# Patient Record
Sex: Female | Born: 1960 | Race: White | Hispanic: No | Smoking: Never smoker
Health system: Southern US, Community
[De-identification: ages and names within clinical notes are randomized; demographics above are authoritative.]

## PROBLEM LIST (undated history)

## (undated) DIAGNOSIS — R0600 Dyspnea, unspecified: Secondary | ICD-10-CM

## (undated) DIAGNOSIS — I82409 Acute embolism and thrombosis of unspecified deep veins of unspecified lower extremity: Secondary | ICD-10-CM

## (undated) DIAGNOSIS — F32A Depression, unspecified: Secondary | ICD-10-CM

## (undated) DIAGNOSIS — F329 Major depressive disorder, single episode, unspecified: Secondary | ICD-10-CM

## (undated) DIAGNOSIS — F419 Anxiety disorder, unspecified: Secondary | ICD-10-CM

## (undated) DIAGNOSIS — M1990 Unspecified osteoarthritis, unspecified site: Secondary | ICD-10-CM

## (undated) HISTORY — PX: COLONOSCOPY W/ POLYPECTOMY: SHX1380

## (undated) HISTORY — PX: TONSILLECTOMY: SUR1361

## (undated) HISTORY — PX: BREAST SURGERY: SHX581

## (undated) HISTORY — PX: OTHER SURGICAL HISTORY: SHX169

## (undated) HISTORY — PX: TUBAL LIGATION: SHX77

---

## 1989-03-07 HISTORY — PX: BACK SURGERY: SHX140

## 1997-07-07 ENCOUNTER — Other Ambulatory Visit: Admission: RE | Admit: 1997-07-07 | Discharge: 1997-07-07 | Payer: Self-pay | Admitting: Obstetrics & Gynecology

## 1998-04-06 ENCOUNTER — Ambulatory Visit (HOSPITAL_COMMUNITY): Admission: RE | Admit: 1998-04-06 | Discharge: 1998-04-06 | Payer: Self-pay | Admitting: Family Medicine

## 1998-04-06 ENCOUNTER — Encounter: Payer: Self-pay | Admitting: Family Medicine

## 2002-01-28 ENCOUNTER — Other Ambulatory Visit: Admission: RE | Admit: 2002-01-28 | Discharge: 2002-01-28 | Payer: Self-pay | Admitting: Obstetrics & Gynecology

## 2014-03-07 HISTORY — PX: BACK SURGERY: SHX140

## 2014-08-12 DIAGNOSIS — M5136 Other intervertebral disc degeneration, lumbar region: Secondary | ICD-10-CM | POA: Insufficient documentation

## 2014-08-12 DIAGNOSIS — M48061 Spinal stenosis, lumbar region without neurogenic claudication: Secondary | ICD-10-CM | POA: Insufficient documentation

## 2014-08-12 DIAGNOSIS — M4726 Other spondylosis with radiculopathy, lumbar region: Secondary | ICD-10-CM | POA: Insufficient documentation

## 2014-08-12 DIAGNOSIS — M5416 Radiculopathy, lumbar region: Secondary | ICD-10-CM | POA: Insufficient documentation

## 2014-08-12 DIAGNOSIS — M51369 Other intervertebral disc degeneration, lumbar region without mention of lumbar back pain or lower extremity pain: Secondary | ICD-10-CM | POA: Insufficient documentation

## 2014-10-28 DIAGNOSIS — M9905 Segmental and somatic dysfunction of pelvic region: Secondary | ICD-10-CM | POA: Insufficient documentation

## 2014-11-25 DIAGNOSIS — G8929 Other chronic pain: Secondary | ICD-10-CM | POA: Insufficient documentation

## 2014-11-25 DIAGNOSIS — M545 Low back pain: Secondary | ICD-10-CM

## 2015-12-03 DIAGNOSIS — M5412 Radiculopathy, cervical region: Secondary | ICD-10-CM | POA: Insufficient documentation

## 2015-12-03 DIAGNOSIS — M533 Sacrococcygeal disorders, not elsewhere classified: Secondary | ICD-10-CM | POA: Insufficient documentation

## 2016-10-14 HISTORY — PX: ESOPHAGOGASTRODUODENOSCOPY: SHX1529

## 2016-10-14 HISTORY — PX: COLONOSCOPY: SHX5424

## 2016-11-21 NOTE — Pre-Procedure Instructions (Signed)
JULIENNE VOGLER  11/21/2016    Your procedure is scheduled on Thursday, September 20     Report to [facility] Admitting at  5:30 AM                   Your surgery or procedure is scheduled for 7:30 AM    Call this number if you have problems the morning of surgery: [phone]                   Remember:  Do not eat food or drink liquids after midnight Wednesday, September 19  Take these medicines the morning of surgery with A SIP OF WATER: FLUoxetine (PROZAC).                Take if needed: oxyCODONE-acetaminophen (PERCOCET/ROXICET)                  STOP taking Aspirin, Aspirin Products (Goody Powder, Excedrin Migraine), Ibuprofen (Advil), Naproxen (Aleve), Vitiamins and Herbal Products (ie Fish Oil).  Special instructions:  [place]- Preparing For Surgery  Before surgery, you can play an important role. Because skin is not sterile, your skin needs to be as free of germs as possible. You can reduce the number of germs on your skin by washing with CHG (chlorahexidine gluconate) Soap before surgery.  CHG is an antiseptic cleaner which kills germs and bonds with the skin to continue killing germs even after washing.  Please do not use if you have an allergy to CHG or antibacterial soaps. If your skin becomes reddened/irritated stop using the CHG.  Do not shave (including legs and underarms) for at least 48 hours prior to first CHG shower. It is OK to shave your face.  Please follow these instructions carefully.   1. Shower the NIGHT BEFORE SURGERY and the MORNING OF SURGERY with CHG.   2. If you chose to wash your hair, wash your hair first as usual with your normal shampoo.  3. After you shampoo, rinse your hair and body thoroughly to remove the shampoo.  Wash your face and private area with your regular home soap and rinse.  4. Use CHG as you would any other liquid soap. You can apply CHG directly to the skin and wash gently with a scrungie or a clean washcloth.    5. Apply the CHG Soap to your body ONLY FROM THE NECK DOWN.  Do not use on open wounds or open sores. Avoid contact with your eyes, ears, mouth and genitals (private parts). Wash genitals (private parts) with your normal soap.  6. Wash thoroughly, paying special attention to the area where your surgery will be performed.  7. Thoroughly rinse your body with warm water from the neck down.  8. DO NOT shower/wash with your normal soap after using and rinsing off the CHG Soap.  9. Pat yourself dry with a CLEAN TOWEL.   10. Wear CLEAN PAJAMAS   11. Place CLEAN SHEETS on your bed the night of your first shower and DO NOT SLEEP WITH PETS.  Day of Surgery: Shower as above Do not apply any deodorants/lotions, powders or colognes. Please wear clean clothes to the hospital/surgery center.    Do not wear jewelry, make-up or nail polish.  Do not shave 48 hours prior to surgery.  Men may shave face and neck.  Do not bring valuables to the hospital.  [facility] is not responsible for any belongings or valuables.  Contacts, dentures  or bridgework may not be worn into surgery.  Leave your suitcase in the car.  After surgery it may be brought to your room.  For patients admitted to the hospital, discharge time will be determined by your treatment team.  Patients discharged the day of surgery will not be allowed to drive home.   Please read over the following fact sheets that you were given: Pain Booklet, Coughing and Deep Breathing, Surgical Site Infections.

## 2016-11-22 ENCOUNTER — Ambulatory Visit: Payer: Self-pay | Admitting: Physician Assistant

## 2016-11-22 ENCOUNTER — Encounter (HOSPITAL_COMMUNITY): Payer: Self-pay

## 2016-11-22 ENCOUNTER — Encounter (HOSPITAL_COMMUNITY)
Admission: RE | Admit: 2016-11-22 | Discharge: 2016-11-22 | Disposition: A | Discharge status: Home / Self Care | Payer: Medicare Other | Source: Ambulatory Visit | Attending: Orthopedic Surgery | Admitting: Orthopedic Surgery

## 2016-11-22 DIAGNOSIS — M5441 Lumbago with sciatica, right side: Secondary | ICD-10-CM | POA: Diagnosis not present

## 2016-11-22 DIAGNOSIS — Z885 Allergy status to narcotic agent status: Secondary | ICD-10-CM | POA: Diagnosis not present

## 2016-11-22 DIAGNOSIS — G894 Chronic pain syndrome: Secondary | ICD-10-CM | POA: Diagnosis not present

## 2016-11-22 DIAGNOSIS — M069 Rheumatoid arthritis, unspecified: Secondary | ICD-10-CM | POA: Diagnosis not present

## 2016-11-22 DIAGNOSIS — M1990 Unspecified osteoarthritis, unspecified site: Secondary | ICD-10-CM | POA: Diagnosis not present

## 2016-11-22 DIAGNOSIS — F419 Anxiety disorder, unspecified: Secondary | ICD-10-CM | POA: Diagnosis not present

## 2016-11-22 DIAGNOSIS — M810 Age-related osteoporosis without current pathological fracture: Secondary | ICD-10-CM | POA: Diagnosis not present

## 2016-11-22 DIAGNOSIS — M961 Postlaminectomy syndrome, not elsewhere classified: Secondary | ICD-10-CM | POA: Diagnosis present

## 2016-11-22 DIAGNOSIS — Z886 Allergy status to analgesic agent status: Secondary | ICD-10-CM | POA: Diagnosis not present

## 2016-11-22 DIAGNOSIS — Z79899 Other long term (current) drug therapy: Secondary | ICD-10-CM | POA: Diagnosis not present

## 2016-11-22 DIAGNOSIS — G8929 Other chronic pain: Secondary | ICD-10-CM | POA: Diagnosis present

## 2016-11-22 DIAGNOSIS — F329 Major depressive disorder, single episode, unspecified: Secondary | ICD-10-CM | POA: Diagnosis not present

## 2016-11-22 HISTORY — DX: Dyspnea, unspecified: R06.00

## 2016-11-22 HISTORY — DX: Unspecified osteoarthritis, unspecified site: M19.90

## 2016-11-22 HISTORY — DX: Major depressive disorder, single episode, unspecified: F32.9

## 2016-11-22 HISTORY — DX: Depression, unspecified: F32.A

## 2016-11-22 LAB — BASIC METABOLIC PANEL
Anion gap: 6 (ref 5–15)
BUN: 14 mg/dL (ref 6–20)
CALCIUM: 9.3 mg/dL (ref 8.9–10.3)
CO2: 24 mmol/L (ref 22–32)
CREATININE: 0.97 mg/dL (ref 0.44–1.00)
Chloride: 108 mmol/L (ref 101–111)
GFR calc Af Amer: >60 mL/min (ref >60)
GFR calc non Af Amer: >60 mL/min (ref >60)
Glucose, Bld: 97 mg/dL (ref 65–99)
Potassium: 4.1 mmol/L (ref 3.5–5.1)
SODIUM: 138 mmol/L (ref 135–145)

## 2016-11-22 LAB — CBC
HCT: 36.9 % (ref 36.0–46.0)
Hemoglobin: 11.7 g/dL — ABNORMAL LOW (ref 12.0–15.0)
MCH: 27.3 pg (ref 26.0–34.0)
MCHC: 31.7 g/dL (ref 30.0–36.0)
MCV: 86 fL (ref 78.0–100.0)
PLATELETS: 326 10*3/uL (ref 150–400)
RBC: 4.29 MIL/uL (ref 3.87–5.11)
RDW: 14 % (ref 11.5–15.5)
WBC: 5.5 10*3/uL (ref 4.0–10.5)

## 2016-11-22 LAB — SURGICAL PCR SCREEN
MRSA, PCR: NEGATIVE
STAPHYLOCOCCUS AUREUS: NEGATIVE

## 2016-11-23 NOTE — Progress Notes (Signed)
Anesthesia Chart Review:  Pt is a 56 year old female scheduled for lumbar spinal cord stimulator insertion on 11/24/2016 with Venita Lick, MD  PMH reviewed. Never smoker. BMI 33  Medications reviewed.  Preoperative labs reviewed.    If no changes, I anticipate pt can proceed with surgery as scheduled.   Rica Mast, FNP-BC [facility] Short Stay Surgical Center/Anesthesiology Phone: [phone] 11/23/2016 9:06 AM

## 2016-11-24 ENCOUNTER — Encounter (HOSPITAL_COMMUNITY)
Admission: RE | Disposition: A | Discharge status: Home / Self Care | Payer: Self-pay | Source: Ambulatory Visit | Attending: Orthopedic Surgery

## 2016-11-24 ENCOUNTER — Ambulatory Visit (HOSPITAL_COMMUNITY): Payer: Medicare Other | Admitting: Anesthesiology

## 2016-11-24 ENCOUNTER — Ambulatory Visit (HOSPITAL_COMMUNITY): Payer: Medicare Other

## 2016-11-24 ENCOUNTER — Ambulatory Visit (HOSPITAL_COMMUNITY): Payer: Medicare Other | Admitting: Emergency Medicine

## 2016-11-24 ENCOUNTER — Observation Stay (HOSPITAL_COMMUNITY)
Admission: RE | Admit: 2016-11-24 | Discharge: 2016-11-25 | Disposition: A | Discharge status: Home / Self Care | Payer: Medicare Other | Source: Ambulatory Visit | Attending: Orthopedic Surgery | Admitting: Orthopedic Surgery

## 2016-11-24 DIAGNOSIS — G8929 Other chronic pain: Secondary | ICD-10-CM | POA: Diagnosis present

## 2016-11-24 DIAGNOSIS — F419 Anxiety disorder, unspecified: Secondary | ICD-10-CM | POA: Diagnosis not present

## 2016-11-24 DIAGNOSIS — G894 Chronic pain syndrome: Secondary | ICD-10-CM | POA: Diagnosis not present

## 2016-11-24 DIAGNOSIS — M961 Postlaminectomy syndrome, not elsewhere classified: Secondary | ICD-10-CM | POA: Diagnosis not present

## 2016-11-24 DIAGNOSIS — Z886 Allergy status to analgesic agent status: Secondary | ICD-10-CM | POA: Insufficient documentation

## 2016-11-24 DIAGNOSIS — M810 Age-related osteoporosis without current pathological fracture: Secondary | ICD-10-CM | POA: Insufficient documentation

## 2016-11-24 DIAGNOSIS — M069 Rheumatoid arthritis, unspecified: Secondary | ICD-10-CM | POA: Insufficient documentation

## 2016-11-24 DIAGNOSIS — F329 Major depressive disorder, single episode, unspecified: Secondary | ICD-10-CM | POA: Insufficient documentation

## 2016-11-24 DIAGNOSIS — M5441 Lumbago with sciatica, right side: Secondary | ICD-10-CM | POA: Insufficient documentation

## 2016-11-24 DIAGNOSIS — Z419 Encounter for procedure for purposes other than remedying health state, unspecified: Secondary | ICD-10-CM

## 2016-11-24 DIAGNOSIS — Z79899 Other long term (current) drug therapy: Secondary | ICD-10-CM | POA: Insufficient documentation

## 2016-11-24 DIAGNOSIS — Z885 Allergy status to narcotic agent status: Secondary | ICD-10-CM | POA: Insufficient documentation

## 2016-11-24 DIAGNOSIS — M1990 Unspecified osteoarthritis, unspecified site: Secondary | ICD-10-CM | POA: Insufficient documentation

## 2016-11-24 HISTORY — PX: SPINAL CORD STIMULATOR INSERTION: SHX5378

## 2016-11-24 SURGERY — INSERTION, SPINAL CORD STIMULATOR, LUMBAR
Anesthesia: General | Site: Spine Thoracic

## 2016-11-24 MED ORDER — METHOCARBAMOL 500 MG PO TABS: 500.0000 mg | ORAL_TABLET | Freq: Three times a day (TID) | ORAL | 0 refills | Status: DC | PRN

## 2016-11-24 MED ORDER — DEXAMETHASONE SODIUM PHOSPHATE 4 MG/ML IJ SOLN: 4.0000 mg | Freq: Four times a day (QID) | INTRAMUSCULAR | Status: AC

## 2016-11-24 MED ORDER — PHENYLEPHRINE HCL 10 MG/ML IJ SOLN
INTRAMUSCULAR | Status: DC | PRN
  Administered 2016-11-24: 160 ug via INTRAVENOUS
  Administered 2016-11-24 (×2): 120 ug via INTRAVENOUS

## 2016-11-24 MED ORDER — ONDANSETRON HCL 4 MG PO TABS: 4.0000 mg | ORAL_TABLET | Freq: Four times a day (QID) | ORAL | Status: DC | PRN

## 2016-11-24 MED ORDER — PHENOL 1.4 % MT LIQD: 1.0000 | OROMUCOSAL | Status: DC | PRN

## 2016-11-24 MED ORDER — HEMOSTATIC AGENTS (NO CHARGE) OPTIME
TOPICAL | Status: DC | PRN
  Administered 2016-11-24: 1 via TOPICAL

## 2016-11-24 MED ORDER — BUPIVACAINE-EPINEPHRINE 0.25% -1:200000 IJ SOLN
INTRAMUSCULAR | Status: DC | PRN
  Administered 2016-11-24: 20 mL

## 2016-11-24 MED ORDER — ARTIFICIAL TEARS OPHTHALMIC OINT
TOPICAL_OINTMENT | OPHTHALMIC | Status: DC | PRN
  Administered 2016-11-24: 1 via OPHTHALMIC

## 2016-11-24 MED ORDER — METHOCARBAMOL 500 MG PO TABS
500.0000 mg | ORAL_TABLET | Freq: Four times a day (QID) | ORAL | Status: DC | PRN
  Administered 2016-11-24 – 2016-11-25 (×3): 500 mg via ORAL
  Filled 2016-11-24 (×3): qty 1

## 2016-11-24 MED ORDER — OXYCODONE HCL 5 MG PO TABS
10.0000 mg | ORAL_TABLET | ORAL | Status: DC | PRN
  Administered 2016-11-24 – 2016-11-25 (×6): 10 mg via ORAL
  Filled 2016-11-24 (×6): qty 2

## 2016-11-24 MED ORDER — GLYCOPYRROLATE 0.2 MG/ML IJ SOLN: INTRAMUSCULAR | Status: DC | PRN

## 2016-11-24 MED ORDER — FLUOXETINE HCL 20 MG PO CAPS
40.0000 mg | ORAL_CAPSULE | Freq: Two times a day (BID) | ORAL | Status: DC
Start: 2016-11-24 — End: 2016-11-25
  Filled 2016-11-24 (×2): qty 2

## 2016-11-24 MED ORDER — MENTHOL 3 MG MT LOZG: 1.0000 | LOZENGE | OROMUCOSAL | Status: DC | PRN

## 2016-11-24 MED ORDER — LACTATED RINGERS IV SOLN
INTRAVENOUS | Status: DC | PRN
  Administered 2016-11-24 (×3): via INTRAVENOUS

## 2016-11-24 MED ORDER — HYDROMORPHONE HCL 1 MG/ML IJ SOLN
INTRAMUSCULAR | Status: AC
  Filled 2016-11-24: qty 1

## 2016-11-24 MED ORDER — LACTATED RINGERS IV SOLN: INTRAVENOUS | Status: DC

## 2016-11-24 MED ORDER — THROMBIN 20000 UNITS EX SOLR
CUTANEOUS | Status: AC
  Filled 2016-11-24: qty 20000

## 2016-11-24 MED ORDER — METHOCARBAMOL 1000 MG/10ML IJ SOLN
500.0000 mg | Freq: Four times a day (QID) | INTRAVENOUS | Status: DC | PRN
  Filled 2016-11-24: qty 5

## 2016-11-24 MED ORDER — PROPOFOL 10 MG/ML IV BOLUS
INTRAVENOUS | Status: DC | PRN
  Administered 2016-11-24 (×2): 100 mg via INTRAVENOUS

## 2016-11-24 MED ORDER — PHENYLEPHRINE HCL 10 MG/ML IJ SOLN
INTRAVENOUS | Status: DC | PRN
  Administered 2016-11-24: 50 ug/min via INTRAVENOUS

## 2016-11-24 MED ORDER — ROCURONIUM BROMIDE 100 MG/10ML IV SOLN
INTRAVENOUS | Status: DC | PRN
  Administered 2016-11-24: 50 mg via INTRAVENOUS

## 2016-11-24 MED ORDER — ONDANSETRON HCL 4 MG/2ML IJ SOLN: 4.0000 mg | Freq: Four times a day (QID) | INTRAMUSCULAR | Status: DC | PRN

## 2016-11-24 MED ORDER — QUETIAPINE FUMARATE 100 MG PO TABS
100.0000 mg | ORAL_TABLET | Freq: Every day | ORAL | Status: DC
  Filled 2016-11-24: qty 1

## 2016-11-24 MED ORDER — CEFAZOLIN SODIUM-DEXTROSE 2-4 GM/100ML-% IV SOLN
2.0000 g | INTRAVENOUS | Status: AC
  Administered 2016-11-24: 2 g via INTRAVENOUS

## 2016-11-24 MED ORDER — BUPIVACAINE-EPINEPHRINE (PF) 0.25% -1:200000 IJ SOLN
INTRAMUSCULAR | Status: AC
  Filled 2016-11-24: qty 30

## 2016-11-24 MED ORDER — OXYCODONE-ACETAMINOPHEN 10-325 MG PO TABS: 1.0000 | ORAL_TABLET | ORAL | 0 refills | Status: DC | PRN

## 2016-11-24 MED ORDER — SODIUM CHLORIDE 0.9 % IV SOLN: 250.0000 mL | INTRAVENOUS | Status: DC

## 2016-11-24 MED ORDER — EPHEDRINE SULFATE 50 MG/ML IJ SOLN
INTRAMUSCULAR | Status: DC | PRN
  Administered 2016-11-24: 10 mg via INTRAVENOUS

## 2016-11-24 MED ORDER — GLYCOPYRROLATE 0.2 MG/ML IJ SOLN
INTRAMUSCULAR | Status: DC | PRN
  Administered 2016-11-24: 0.2 mg via INTRAVENOUS

## 2016-11-24 MED ORDER — SODIUM CHLORIDE 0.9% FLUSH: 3.0000 mL | Freq: Two times a day (BID) | INTRAVENOUS | Status: DC

## 2016-11-24 MED ORDER — THROMBIN 20000 UNITS EX SOLR
CUTANEOUS | Status: DC | PRN
  Administered 2016-11-24: 20000 [IU] via TOPICAL

## 2016-11-24 MED ORDER — HYDROMORPHONE HCL 1 MG/ML IJ SOLN
0.2500 mg | INTRAMUSCULAR | Status: DC | PRN
  Administered 2016-11-24 (×3): 0.5 mg via INTRAVENOUS

## 2016-11-24 MED ORDER — ACETAMINOPHEN 10 MG/ML IV SOLN
INTRAVENOUS | Status: DC | PRN
Start: 2016-11-24 — End: 2016-11-24
  Administered 2016-11-24: 1000 mg via INTRAVENOUS

## 2016-11-24 MED ORDER — DEXAMETHASONE 4 MG PO TABS
4.0000 mg | ORAL_TABLET | Freq: Four times a day (QID) | ORAL | Status: AC
  Administered 2016-11-24 (×3): 4 mg via ORAL
  Filled 2016-11-24 (×3): qty 1

## 2016-11-24 MED ORDER — LIDOCAINE HCL (CARDIAC) 20 MG/ML IV SOLN
INTRAVENOUS | Status: DC | PRN
  Administered 2016-11-24: 80 mg via INTRATRACHEAL

## 2016-11-24 MED ORDER — DEXAMETHASONE SODIUM PHOSPHATE 10 MG/ML IJ SOLN
INTRAMUSCULAR | Status: DC | PRN
  Administered 2016-11-24: 10 mg via INTRAVENOUS

## 2016-11-24 MED ORDER — PROMETHAZINE HCL 25 MG/ML IJ SOLN
INTRAMUSCULAR | Status: AC
  Filled 2016-11-24: qty 1

## 2016-11-24 MED ORDER — SODIUM CHLORIDE 0.9% FLUSH: 3.0000 mL | INTRAVENOUS | Status: DC | PRN

## 2016-11-24 MED ORDER — ONDANSETRON HCL 4 MG/2ML IJ SOLN
INTRAMUSCULAR | Status: DC | PRN
  Administered 2016-11-24: 4 mg via INTRAVENOUS

## 2016-11-24 MED ORDER — FENTANYL CITRATE (PF) 250 MCG/5ML IJ SOLN
INTRAMUSCULAR | Status: DC | PRN
  Administered 2016-11-24: 50 ug via INTRAVENOUS
  Administered 2016-11-24: 75 ug via INTRAVENOUS
  Administered 2016-11-24: 50 ug via INTRAVENOUS
  Administered 2016-11-24: 100 ug via INTRAVENOUS
  Administered 2016-11-24: 25 ug via INTRAVENOUS
  Administered 2016-11-24: 50 ug via INTRAVENOUS

## 2016-11-24 MED ORDER — MIDAZOLAM HCL 2 MG/2ML IJ SOLN
INTRAMUSCULAR | Status: DC | PRN
  Administered 2016-11-24 (×2): 1 mg via INTRAVENOUS

## 2016-11-24 MED ORDER — CEFAZOLIN SODIUM-DEXTROSE 1-4 GM/50ML-% IV SOLN
1.0000 g | Freq: Three times a day (TID) | INTRAVENOUS | Status: AC
  Administered 2016-11-24 (×2): 1 g via INTRAVENOUS
  Filled 2016-11-24 (×2): qty 50

## 2016-11-24 MED ORDER — 0.9 % SODIUM CHLORIDE (POUR BTL) OPTIME
TOPICAL | Status: DC | PRN
  Administered 2016-11-24 (×2): 1000 mL

## 2016-11-24 MED ORDER — PROMETHAZINE HCL 25 MG/ML IJ SOLN
6.2500 mg | INTRAMUSCULAR | Status: DC | PRN
  Administered 2016-11-24: 6.25 mg via INTRAVENOUS

## 2016-11-24 MED ORDER — SUGAMMADEX SODIUM 200 MG/2ML IV SOLN
INTRAVENOUS | Status: DC | PRN
  Administered 2016-11-24: 200 mg via INTRAVENOUS

## 2016-11-24 SURGICAL SUPPLY — 57 items
CANISTER SUCT 3000ML PPV (MISCELLANEOUS) ×3 IMPLANT
CLOSURE STERI-STRIP 1/2X4 (GAUZE/BANDAGES/DRESSINGS) ×2
CLSR STERI-STRIP ANTIMIC 1/2X4 (GAUZE/BANDAGES/DRESSINGS) ×3 IMPLANT
COVER PROBE W GEL 5X96 (DRAPES) IMPLANT
COVER SURGICAL LIGHT HANDLE (MISCELLANEOUS) ×3 IMPLANT
DRAPE C-ARM 42X72 X-RAY (DRAPES) ×3 IMPLANT
DRAPE INCISE IOBAN 85X60 (DRAPES) ×3 IMPLANT
DRAPE SURG 17X23 STRL (DRAPES) ×3 IMPLANT
DRAPE U-SHAPE 47X51 STRL (DRAPES) ×3 IMPLANT
DRSG AQUACEL AG ADV 3.5X 6 (GAUZE/BANDAGES/DRESSINGS) ×6 IMPLANT
DRSG OPSITE POSTOP 4X6 (GAUZE/BANDAGES/DRESSINGS) ×4 IMPLANT
DURAPREP 26ML APPLICATOR (WOUND CARE) ×3 IMPLANT
ELECT BLADE 4.0 EZ CLEAN MEGAD (MISCELLANEOUS) ×3
ELECT CAUTERY BLADE 6.4 (BLADE) ×3 IMPLANT
ELECT PENCIL ROCKER SW 15FT (MISCELLANEOUS) ×3 IMPLANT
ELECT REM PT RETURN 9FT ADLT (ELECTROSURGICAL) ×3
ELECTRODE BLDE 4.0 EZ CLN MEGD (MISCELLANEOUS) IMPLANT
ELECTRODE REM PT RTRN 9FT ADLT (ELECTROSURGICAL) ×1 IMPLANT
GLOVE BIO SURGEON STRL SZ7 (GLOVE) ×3 IMPLANT
GLOVE BIOGEL PI IND STRL 7.0 (GLOVE) ×1 IMPLANT
GLOVE BIOGEL PI IND STRL 8.5 (GLOVE) ×1 IMPLANT
GLOVE BIOGEL PI INDICATOR 7.0 (GLOVE) ×2
GLOVE BIOGEL PI INDICATOR 8.5 (GLOVE) ×2
GLOVE SS N UNI LF 8.5 STRL (GLOVE) ×6 IMPLANT
GOWN STRL REUS W/ TWL LRG LVL3 (GOWN DISPOSABLE) ×2 IMPLANT
GOWN STRL REUS W/TWL 2XL LVL3 (GOWN DISPOSABLE) ×3 IMPLANT
GOWN STRL REUS W/TWL LRG LVL3 (GOWN DISPOSABLE) ×6
KIT BASIN OR (CUSTOM PROCEDURE TRAY) ×3 IMPLANT
KIT ROOM TURNOVER OR (KITS) ×3 IMPLANT
LEAD LAMITRODE PENTA 3MM 60CM (Lead) ×2 IMPLANT
NDL SPNL 18GX3.5 QUINCKE PK (NEEDLE) ×1 IMPLANT
NEEDLE 22X1 1/2 (OR ONLY) (NEEDLE) ×5 IMPLANT
NEEDLE SPNL 18GX3.5 QUINCKE PK (NEEDLE) ×3 IMPLANT
NS IRRIG 1000ML POUR BTL (IV SOLUTION) ×5 IMPLANT
PACK LAMINECTOMY ORTHO (CUSTOM PROCEDURE TRAY) ×3 IMPLANT
PACK UNIVERSAL I (CUSTOM PROCEDURE TRAY) ×3 IMPLANT
PAD ARMBOARD 7.5X6 YLW CONV (MISCELLANEOUS) ×6 IMPLANT
PROCLAIM ELITE 7IPG W/PAT CONT (Neuro Prosthesis/Implant) ×2 IMPLANT
SPATULA SILICONE BRAIN 10MM (MISCELLANEOUS) IMPLANT
SPONGE LAP 4X18 X RAY DECT (DISPOSABLE) IMPLANT
SPONGE SURGIFOAM ABS GEL 100 (HEMOSTASIS) ×1 IMPLANT
STAPLER VISISTAT 35W (STAPLE) ×3 IMPLANT
SURGIFLO W/THROMBIN 8M KIT (HEMOSTASIS) ×5 IMPLANT
SUT BONE WAX W31G (SUTURE) ×3 IMPLANT
SUT ETHIBOND 2 OS 4 DA (SUTURE) ×3 IMPLANT
SUT MNCRL AB 3-0 PS2 18 (SUTURE) ×6 IMPLANT
SUT MON AB 3-0 SH 27 (SUTURE) ×3
SUT MON AB 3-0 SH27 (SUTURE) IMPLANT
SUT VIC AB 1 CT1 18XCR BRD 8 (SUTURE) ×2 IMPLANT
SUT VIC AB 1 CT1 8-18 (SUTURE) ×6
SUT VIC AB 2-0 CT1 18 (SUTURE) ×3 IMPLANT
SYR BULB IRRIGATION 50ML (SYRINGE) ×3 IMPLANT
SYR CONTROL 10ML LL (SYRINGE) ×3 IMPLANT
TOWEL OR 17X24 6PK STRL BLUE (TOWEL DISPOSABLE) ×5 IMPLANT
TOWEL OR 17X26 10 PK STRL BLUE (TOWEL DISPOSABLE) ×3 IMPLANT
TRAY CATH 16FR W/PLASTIC CATH (SET/KITS/TRAYS/PACK) ×2 IMPLANT
WATER STERILE IRR 1000ML POUR (IV SOLUTION) ×1 IMPLANT

## 2016-11-24 NOTE — Anesthesia Postprocedure Evaluation (Signed)
Anesthesia Post Note  Patient: Brittney Carter  Procedure(s) Performed: Procedure(s) (LRB): LUMBAR SPINAL CORD STIMULATOR INSERTION (N/A)     Patient location during evaluation: PACU Anesthesia Type: General Level of consciousness: awake and sedated Pain management: pain level controlled Vital Signs Assessment: post-procedure vital signs reviewed and stable Respiratory status: spontaneous breathing, nonlabored ventilation, respiratory function stable and patient connected to nasal cannula oxygen Cardiovascular status: blood pressure returned to baseline and stable Postop Assessment: no apparent nausea or vomiting Anesthetic complications: no    Last Vitals:  Vitals:   11/24/16 1155 11/24/16 1200  BP: 116/82   Pulse: 68 61  Resp: 17 11  Temp:  (!) 36.3 C  SpO2: 93% 98%    Last Pain:  Vitals:   11/24/16 1153  PainSc: Asleep                 [name],JAMES TERRILL

## 2016-11-24 NOTE — Discharge Instructions (Signed)
Please contact Pain Management provider to discuss/obtain additional pain medications as well as appropriate pain regimen.

## 2016-11-24 NOTE — Anesthesia Preprocedure Evaluation (Addendum)
Anesthesia Evaluation  Patient identified by MRN, date of birth, ID band Patient awake    Reviewed: Allergy & Precautions, NPO status , Patient's Chart, lab work & pertinent test results  Airway Mallampati: II  TM Distance: >3 FB Neck ROM: Full    Dental no notable dental hx.    Pulmonary    breath sounds clear to auscultation       Cardiovascular negative cardio ROS   Rhythm:Regular Rate:Normal     Neuro/Psych    GI/Hepatic negative GI ROS, Neg liver ROS,   Endo/Other  negative endocrine ROS  Renal/GU negative Renal ROS     Musculoskeletal Chronic back pain syndrome   Abdominal   Peds  Hematology   Anesthesia Other Findings   Reproductive/Obstetrics                            Anesthesia Physical Anesthesia Plan  ASA: II  Anesthesia Plan: General   Post-op Pain Management:    Induction:   PONV Risk Score and Plan: 4 or greater and Ondansetron, Dexamethasone, Midazolam, Scopolamine patch - Pre-op, Propofol infusion and Treatment may vary due to age or medical condition  Airway Management Planned: Oral ETT  Additional Equipment:   Intra-op Plan:   Post-operative Plan: Extubation in OR  Informed Consent: I have reviewed the patients History and Physical, chart, labs and discussed the procedure including the risks, benefits and alternatives for the proposed anesthesia with the patient or authorized representative who has indicated his/her understanding and acceptance.     Plan Discussed with: CRNA  Anesthesia Plan Comments:         Anesthesia Quick Evaluation

## 2016-11-24 NOTE — Op Note (Signed)
Operative note.  Preoperative diagnosis. Chronic pain syndrome. Failed back syndrome.  Postoperative diagnosis. Same.  Operative procedure. Implantation of spinal cord stimulator. Implant system used was the [facility]. Jude system. The Penta lead placed via T9 laminotomy. With the proclaimed 7 battery placed in the left posterior lateral quadrant lumbar spine.  Complications. None.  Indications. This is a very pleasant 56 year old woman has had multiple spinal procedures and unfortunately has had chronic pain. Unfortunately her quality of life is suffered and her pain is require long-term narcotic usage. She ultimately had a spinal cord stimulator trial and the success. There is reduce pain , and improved quality of life. As a result of the positive trial result we elected to proceed with implantation. All appropriate risks benefits and alternatives were discussed with the patient. Consent was obtained after informed consent.  Operative note. Patient was brought the operating room placed on the operating room table. After successful induction general anesthesia and endotracheal intubation teds SCDs were applied and she was turned prone onto the Wilson frame. The arms were tucked at the side and the thoracolumbar spine was prepped and draped in a standard fashion. All bony prominences were well-padded. A timeout was then taken this point to confirm patient procedure and all other important data.  X-ray was used to identify the T9 10 disc space and I marked out my incision. I infiltrated this incision with quarter percent Marcaine and then made a midline incision. Sharp dissection was carried out down to the deep fascia I exposed the posterior aspect of the T9-T10 and T11 spinous process I then stripped the paraspinal muscles using Cobb and Bovie to expose the inferior portion of the T9 lamina all of T10 and the majority T11. Hemostasis was obtained using bipolar cautery. Self-retaining retractors were placed  into the wound and then I used x-ray again to confirm the T9 pedicle as well as the T9-10 disc space level. Once I confirm the appropriate level I removed the inferior portion of T9 spinous process using a double-action Leksell rongeur. I then used a 2 and 3 mm Kerrison punch to perform a laminotomy of T9. Penfield 4 was used to dissect through the central raphae ligamentum flavum and a 2 mm Kerrison punch was used to excise the ligamentum flavum and expose the dorsal surface of the thecal sac. At this point I used my trial implant and easily passed to the base of the T7-8 disc space level. With this is a implantation I obtained the implant and then gently passed the Penta lead via my T9 laminotomy of to the T7-8 disc space. I confirmed with the [place] rep that the implant was properly positioned and covering the area for stimulation for best pain relief. Once this was confirmed by the rep I then secured the leads directly to the spinous process of T T10 with a #2 Ethibond. I then made a second incision over the left gluteal region and dissected down approximately 3-1/2 cm I created a pocket in the submuscular passer advanced the leads from the thoracic wound to the gluteal wound. The leads were then connected to the battery and then secured the battery to the deep fascia with 2 #1 Vicryl sutures.  At this point all wounds were copiously irrigated with normal saline hemostasis was obtained using bipolar electrocautery and FloSeal. Both wounds were closed in layered fashion with interrupted #1 Vicryl suture, 2-0 Vicryl suture, and 3-0 Monocryl for the skin. Steri-Strips dry dressings were applied and the patient  was extubated. Following extubation catheter was done because of the fluids that she received intraoperatively. Patient was ultimately transferred to the PACU that incident. The end of the case all needle sponge counts were correct. There are no adverse intraoperative

## 2016-11-24 NOTE — OR Nursing (Signed)
5009: in&out cath=450cc cyu per protocol, no trauma

## 2016-11-24 NOTE — Brief Op Note (Signed)
11/24/2016  10:00 AM  PATIENT:  [name]  56 y.o. female  PRE-OPERATIVE DIAGNOSIS:  Chronic pain, failed back syndrome  POST-OPERATIVE DIAGNOSIS:  Chronic pain, failed back syndrome  PROCEDURE:  Procedure(s) with comments: LUMBAR SPINAL CORD STIMULATOR INSERTION (N/A) - 2.5 hrs  SURGEON:  Surgeon(s) and Role:    Melina Schools, MD - Primary  PHYSICIAN ASSISTANT:   ASSISTANTS: none   ANESTHESIA:   general  EBL:  Total I/O In: 1000 [I.V.:1000] Out: 475 [Urine:450; Blood:25]  BLOOD ADMINISTERED:none  DRAINS: none   LOCAL MEDICATIONS USED:  MARCAINE     SPECIMEN:  No Specimen  DISPOSITION OF SPECIMEN:  N/A  COUNTS:  YES  TOURNIQUET:  * No tourniquets in log *  DICTATION: .Dragon Dictation  PLAN OF CARE: Admit for overnight observation  PATIENT DISPOSITION:  PACU - hemodynamically stable.

## 2016-11-24 NOTE — Transfer of Care (Signed)
Immediate Anesthesia Transfer of Carter Note  Patient: Brittney Carter  Procedure(s) Performed: Procedure(s) with comments: LUMBAR SPINAL CORD STIMULATOR INSERTION (N/A) - 2.5 hrs  Patient Location: PACU  Anesthesia Type:General  Level of Consciousness: awake, alert  and patient cooperative  Airway & Oxygen Therapy: Patient Spontanous Breathing and Patient connected to nasal cannula oxygen  Post-op Assessment: Report given to RN, Post -op Vital signs reviewed and stable, Patient moving all extremities X 4 and Patient able to stick tongue midline  Post vital signs: Reviewed and stable  Last Vitals:  Vitals:   11/24/16 0601  BP: 122/81  Pulse: 65  Resp: 20  SpO2: 96%    Last Pain:  Vitals:   11/24/16 0622  PainSc: 10-Worst pain ever      Patients Stated Pain Goal: 3 (11/24/16 0622)  Complications: No apparent anesthesia complications

## 2016-11-24 NOTE — H&P (Signed)
History of Present Illness  The patient is a 56 year old female who presents for a Follow-up for H & P. The patient is scheduled for a SCS placement to be performed by Dr. Duane Lope D. [name], MD at [facility] on 11-24-16 . Pt reports hx of good health. Pt has hx of a back surgeries in 1991 and 2016.  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 TraMADol HCl *ANALGESICS - OPIOID*  Difficulty breathing, Rash, Swelling. Allergies Reconciled   Social History  Children  2 Current drinker  06/28/2016: Currently drinks only occasionally per week Current work status  disabled Exercise  Exercises rarely; does other Living situation  live alone Marital status  divorced No history of drug/alcohol rehab  Not under pain contract  Tobacco / smoke exposure  06/28/2016: no Tobacco use  Never smoker. 06/28/2016  Medication History  Percocet (5-325MG Tablet, 1 (one) Oral three times daily, as needed, Taken starting 11/16/2016) Active. FLUoxetine HCl (40MG Capsule, Oral) Active. Belsomra (20MG Tablet, Oral) Active. QUEtiapine Fumarate (100MG Tablet, Oral) Active. Medications Reconciled  Past Surgical History  Mammoplasty; Reduction  bilateral Other Orthopaedic Surgery  Spinal Decompression  lower back Spinal Surgery  Tonsillectomy  Tubal Ligation   Other Problems Anxiety Disorder  Chronic Pain  Depression  Osteoarthritis  Osteoporosis  Rheumatoid Arthritis   Vitals  11/22/2016 1:27 PM Weight: 159 lb Height: 60.5in Body Surface Area: 1.7 m Body Mass Index: 30.54 kg/m  Temp.: 97.62F(Oral)  Pulse: 78 (Regular)  BP: 119/77 (Sitting, Left Arm, Standard)  General General Appearance-Not in acute distress. Orientation-Oriented  X3. Build & Nutrition-Well nourished and Well developed.  Integumentary General Characteristics Surgical Scars - surgical scarring consistent with previous lumbar surgery. Lumbar Spine-Skin examination of the lumbar spine is without deformity, skin lesions, lacerations or abrasions.  Chest and Lung Exam Auscultation Breath sounds - Normal and Clear.  Cardiovascular Auscultation Rhythm - Regular rate and rhythm.  Abdomen Palpation/Percussion Palpation and Percussion of the abdomen reveal - Soft, Non Tender and No Rebound tenderness.  Peripheral Vascular Lower Extremity Palpation - Posterior tibial pulse - Bilateral - 2+. Dorsalis pedis pulse - Bilateral - 2+.  Neurologic Sensation Lower Extremity - Bilateral - sensation is intact in the lower extremity. Reflexes Patellar Reflex - Bilateral - 2+. Achilles Reflex - Bilateral - 2+. Clonus - Bilateral - clonus not present. Hoffman's Sign - Bilateral - Hoffman's sign not present. Testing Seated Straight Leg Raise - Bilateral - Seated straight leg raise negative.  Musculoskeletal Spine/Ribs/Pelvis  Lumbosacral Spine: Inspection and Palpation - Tenderness - left lumbar paraspinals tender to palpation and right lumbar paraspinals tender to palpation. Strength and Tone: Strength - Hip Flexion - Bilateral - 5/5. Knee Extension - Bilateral - 5/5. Knee Flexion - Bilateral - 5/5. Ankle Dorsiflexion - Bilateral - 5/5. Ankle Plantarflexion - Bilateral - 5/5. Heel walk - Bilateral - able to heel walk without difficulty. Toe Walk - Bilateral - able to walk on toes without difficulty. Heel-Toe Walk - Bilateral - able to heel-toe walk without difficulty. ROM - Flexion - moderately decreased range of motion and painful. Extension - moderately decreased range of motion and painful. Left Lateral Bending - moderately decreased range of motion and painful. Right Lateral Bending - moderately decreased range of motion and painful. Right Rotation -  moderately decreased range of motion and painful.  Left Rotation - moderately decreased range of motion and painful. Pain - neither flexion or extension is more painful than the other. Lumbosacral Spine - Waddell's Signs - no Waddell's signs present. Lower Extremity Range of Motion - No true hip, knee or ankle pain with range of motion. Gait and Station - [employer] - no assistive devices.  Thoracic MRI completed November 11, 2016 shows no significant central or lateral recess stenosis. No cord signal changes. There is some minor degenerative changes but no contraindication for spinal cord stimulator placement.  Impression. This is a very pleasant young woman has had chronic debilitating back buttock and bilateral leg pain for some time now. Despite conservative management she is continued to deteriorate. She has had 2 previous lumbar spinal surgeries the most recent was in 2015 but has continued to deteriorate. She recently saw Dr. Brandon Melnick and had a spinal cord stimulator trial. The patient had significant improvement in pain, quality of life, and required less pain medications. She returns to me today to move forward with definitive implantation.  Risks of surgery. Infection, bleeding, death, stroke, paralysis, hardware failure, ongoing or worse pain, need for additional surgery, bleeding, blood clots, and migration of the lead.  All of her questions were encouraged and addressed. We will move forward with surgery in the very near future. We will also obtain preoperative medical clearance.

## 2016-11-24 NOTE — Anesthesia Procedure Notes (Signed)
Procedure Name: Intubation Date/Time: 11/24/2016 7:45 AM Performed by: Rica Koyanagi Pre-anesthesia Checklist: Patient identified, Emergency Drugs available, Suction available and Patient being monitored Patient Re-evaluated:Patient Re-evaluated prior to induction Oxygen Delivery Method: Circle system utilized Preoxygenation: Pre-oxygenation with 100% oxygen Induction Type: IV induction Ventilation: Mask ventilation without difficulty Laryngoscope Size: Mac and 3 Grade View: Grade I Tube type: Oral Tube size: 7.0 mm Number of attempts: 1 Airway Equipment and Method: Stylet Placement Confirmation: ETT inserted through vocal cords under direct vision,  positive ETCO2 and breath sounds checked- equal and bilateral Secured at: 22 cm Tube secured with: Tape Dental Injury: Teeth and Oropharynx as per pre-operative assessment

## 2016-11-24 NOTE — Progress Notes (Signed)
PT Cancellation Note  Patient Details Name: Brittney Carter MRN: [id number] DOB: June 09, 1960   Cancelled Treatment:    Reason Eval/Treat Not Completed: Pain limiting ability to participate;Patient declined, no reason specified;Fatigue/lethargy limiting ability to participate Pt reports just getting back into bed after going to bathroom; complaining of pain despite bring given pain meds and not wanting to participate with PT. Will follow up.   Blake Divine A Laina Guerrieri 11/24/2016, 3:02 PM Mylo Red, PT, DPT [phone]

## 2016-11-25 ENCOUNTER — Encounter (HOSPITAL_COMMUNITY): Payer: Self-pay | Admitting: Orthopedic Surgery

## 2016-11-25 DIAGNOSIS — G894 Chronic pain syndrome: Secondary | ICD-10-CM | POA: Diagnosis not present

## 2016-11-25 NOTE — Evaluation (Signed)
Physical Therapy Evaluation Patient Details Name: DETRICE CALES MRN: [id number] DOB: 14-Feb-1961 Today's Date: 11/25/2016   History of Present Illness  Pt is a 56 y/o female who presents s/p lumbar spinal cord stimulator insertion on 11/24/16.  Clinical Impression  Patient evaluated by Physical Therapy with no further acute PT needs identified. All education has been completed and the patient has no further questions. At the time of PT eval pt was able to perform transfers and ambulation with gross supervision for safety and occasional mod I. See below for any follow-up Physical Therapy or equipment needs. PT is signing off. Thank you for this referral.     Follow Up Recommendations No PT follow up;Supervision for mobility/OOB    Equipment Recommendations  None recommended by PT    Recommendations for Other Services       Precautions / Restrictions Precautions Precautions: Fall;Back Precaution Booklet Issued: Yes (comment) Precaution Comments: Reviewed for comfort and proper posture Restrictions Weight Bearing Restrictions: No      Mobility  Bed Mobility               General bed mobility comments: Pt sitting EOB when PT arrived  Transfers Overall transfer level: Modified independent Equipment used: None Transfers: Sit to/from Stand Sit to Stand: Modified independent (Device/Increase time)            Ambulation/Gait Ambulation/Gait assistance: Supervision Ambulation Distance (Feet): 500 Feet Assistive device: None Gait Pattern/deviations: Step-through pattern;Decreased stride length;Trunk flexed Gait velocity: Decreased Gait velocity interpretation: Below normal speed for age/gender General Gait Details: VC's for improved posture. Pt eager for d/c and PT assisted pt to ambulate to the main entrance to meet her ride  Stairs            Wheelchair Mobility    Modified Rankin (Stroke Patients Only)       Balance Overall balance assessment: No  apparent balance deficits (not formally assessed)                                           Pertinent Vitals/Pain Pain Assessment: Faces Faces Pain Scale: Hurts little more Pain Location: Back Pain Descriptors / Indicators: Operative site guarding Pain Intervention(s): Limited activity within patient's tolerance;Monitored during session;Repositioned    Home Living Family/patient expects to be discharged to:: Private residence Living Arrangements: Alone Available Help at Discharge: Family;Available 24 hours/day Type of Home: Independent living facility Home Access: Level entry     Home Layout: One level Home Equipment: Walker - standard;Grab bars - toilet;Grab bars - tub/shower;Cane - single point      Prior Function Level of Independence: Independent               Hand Dominance        Extremity/Trunk Assessment   Upper Extremity Assessment Upper Extremity Assessment: Defer to OT evaluation    Lower Extremity Assessment Lower Extremity Assessment: Generalized weakness (Consistent with pre-op diagnosis)    Cervical / Trunk Assessment Cervical / Trunk Assessment: Other exceptions Cervical / Trunk Exceptions: Forward head posture with rounded shoulders  Communication   Communication: No difficulties  Cognition Arousal/Alertness: Awake/alert Behavior During Therapy: WFL for tasks assessed/performed Overall Cognitive Status: Within Functional Limits for tasks assessed  General Comments      Exercises     Assessment/Plan    PT Assessment Patent does not need any further PT services  PT Problem List         PT Treatment Interventions      PT Goals (Current goals can be found in the Care Plan section)  Acute Rehab PT Goals Patient Stated Goal: home today PT Goal Formulation: All assessment and education complete, DC therapy    Frequency     Barriers to discharge         Co-evaluation               AM-PAC PT "6 Clicks" Daily Activity  Outcome Measure Difficulty turning over in bed (including adjusting bedclothes, sheets and blankets)?: A Little Difficulty moving from lying on back to sitting on the side of the bed? : A Little Difficulty sitting down on and standing up from a chair with arms (e.g., wheelchair, bedside commode, etc,.)?: None Help needed moving to and from a bed to chair (including a wheelchair)?: A Little Help needed walking in hospital room?: None Help needed climbing 3-5 steps with a railing? : A Little 6 Click Score: 20    End of Session   Activity Tolerance: Patient tolerated treatment well Patient left: Other (comment) (In car for d/c home with sister present) Nurse Communication: Mobility status PT Visit Diagnosis: Other abnormalities of gait and mobility (R26.89);Other symptoms and signs involving the nervous system (R29.898);Pain Pain - part of body:  (back)    Time: 1610-9604 PT Time Calculation (min) (ACUTE ONLY): 22 min   Charges:   PT Evaluation $PT Eval Low Complexity: 1 Low     PT G Codes:   PT G-Codes **NOT FOR INPATIENT CLASS** Functional Assessment Tool Used: Clinical judgement Functional Limitation: Mobility: Walking and moving around Mobility: Walking and Moving Around Current Status (V4098): At least 20 percent but less than 40 percent impaired, limited or restricted Mobility: Walking and Moving Around Goal Status [phone]): At least 20 percent but less than 40 percent impaired, limited or restricted Mobility: Walking and Moving Around Discharge Status [phone]): At least 20 percent but less than 40 percent impaired, limited or restricted    Conni Slipper, PT, DPT Acute Rehabilitation Services Pager: [phone]   Marylynn Pearson 11/25/2016, 11:37 AM

## 2016-11-25 NOTE — Progress Notes (Signed)
OT Cancellation    11/25/16 0900  OT Visit Information  Last OT Received On 11/25/16  Reason Eval/Treat Not Completed OT screened, no needs identified, will sign off  [facility], OT/L  [phone] 11/25/2016

## 2016-11-25 NOTE — Progress Notes (Signed)
    Subjective: Procedure(s) (LRB): LUMBAR SPINAL CORD STIMULATOR INSERTION (N/A) 1 Day Post-Op  Patient reports pain as 4 on 0-10 scale.  Reports decreased leg pain reports incisional back pain   Positive void Negative bowel movement Positive flatus Negative chest pain or shortness of breath  Objective: Vital signs in last 24 hours: Temp:  [97.4 F (36.3 C)-98.8 F (37.1 C)] 98.5 F (36.9 C) (09/21 0400) Pulse Rate:  [61-91] 78 (09/21 0400) Resp:  [11-29] 18 (09/21 0400) BP: (98-129)/(65-82) 117/73 (09/21 0400) SpO2:  [93 %-98 %] 95 % (09/21 0400) FiO2 (%):  [2 %] 2 % (09/20 1229)  Intake/Output from previous day: 09/20 0701 - 09/21 0700 In: 2000 [I.V.:2000] Out: 475 [Urine:450; Blood:25]  Labs:  Recent Labs  11/22/16 1123  WBC 5.5  RBC 4.29  HCT 36.9  PLT 326    Recent Labs  11/22/16 1123  NA 138  K 4.1  CL 108  CO2 24  BUN 14  CREATININE 0.97  GLUCOSE 97  CALCIUM 9.3   No results for input(s): LABPT, INR in the last 72 hours.  Physical Exam: Neurologically intact ABD soft Intact pulses distally Incision: dressing C/D/I Compartment soft  Assessment/Plan: Patient stable  xrays n/a Continue mobilization with physical therapy Continue care  Advance diet Up with therapy  Doing well overall Pain control is an issue.  Given chronic use of narcotics I have recommended she contact Dr Nelva Bush on Monday to discuss adjustment to narcotic regimen   Melina Schools, MD [place] [phone]

## 2016-11-25 NOTE — Progress Notes (Signed)
Patient alert and oriented, mae's well, voiding adequate amount of urine, swallowing without difficulty, c/o pain at time of discharge and medication already given. Patient discharged home with family. Script and discharged instructions given to patient. Patient and family stated understanding of instructions given. Patient has an appointment with Dr. Shon Baton,

## 2016-12-14 NOTE — Discharge Summary (Signed)
Patient ID: Brittney Carter MRN: [id number] DOB/AGE: 56-Jun-1962 56 y.o.  Admit date: 11/24/2016 Discharge date: 11/25/16  Admission Diagnoses:  Active Problems:   Chronic pain   Discharge Diagnoses:  Active Problems:   Chronic pain  status post Procedure(s): LUMBAR SPINAL CORD STIMULATOR INSERTION  Past Medical History:  Diagnosis Date  . Arthritis    Back  . Depression    no medication  . Dyspnea    with exertion     Surgeries: Procedure(s): LUMBAR SPINAL CORD STIMULATOR INSERTION on 11/24/2016   Consultants:   Discharged Condition: Improved  Hospital Course: Brittney Carter is an 56 y.o. female who was admitted 11/24/2016 for operative treatment of Chronic pain syndrome. Patient failed conservative treatments (please see the history and physical for the specifics) and had severe unremitting pain that affects sleep, daily activities and work/hobbies. After pre-op clearance, the patient was taken to the operating room on 11/24/2016 and underwent  Procedure(s): [place].  Post op day one pt reports moderate pain controlled on oral meds.  Pt is voiding w/o difficulty.  Pt is ambulating in hallway.  Pt is cleared by PT for DC.  Patient was given perioperative antibiotics:  Anti-infectives    Start     Dose/Rate Route Frequency Ordered Stop   11/24/16 1600  ceFAZolin (ANCEF) IVPB 1 g/50 mL premix     1 g 100 mL/hr over 30 Minutes Intravenous Every 8 hours 11/24/16 1330 11/25/16 0026   11/24/16 0603  ceFAZolin (ANCEF) IVPB 2g/100 mL premix     2 g 200 mL/hr over 30 Minutes Intravenous 30 min pre-op 11/24/16 0603 11/24/16 0740       Patient was given sequential compression devices and early ambulation to prevent DVT.   Patient benefited maximally from hospital stay and there were no complications. At the time of discharge, the patient was urinating/moving their bowels without difficulty, tolerating a regular diet, pain is controlled  with oral pain medications and they have been cleared by PT/OT.   Recent vital signs: No data found.    Recent laboratory studies: No results for input(s): WBC, HGB, HCT, PLT, NA, K, CL, CO2, BUN, CREATININE, GLUCOSE, INR, CALCIUM in the last 72 hours.  Invalid input(s): PT, 2   Discharge Medications:   Allergies as of 11/25/2016      Reactions   Morphine Anaphylaxis, Hives, Shortness Of Breath, Rash   Tramadol Anaphylaxis, Swelling, Rash   headache      Medication List    STOP taking these medications   ibuprofen 200 MG tablet Commonly known as:  ADVIL,MOTRIN   oxyCODONE-acetaminophen 5-325 MG tablet Commonly known as:  PERCOCET/ROXICET Replaced by:  oxyCODONE-acetaminophen 10-325 MG tablet     TAKE these medications   FLUoxetine 40 MG capsule Commonly known as:  PROZAC Take 40 mg by mouth 2 (two) times daily.   methocarbamol 500 MG tablet Commonly known as:  ROBAXIN Take 1 tablet (500 mg total) by mouth 3 (three) times daily as needed for muscle spasms.   oxyCODONE-acetaminophen 10-325 MG tablet Commonly known as:  PERCOCET Take 1 tablet by mouth every 4 (four) hours as needed for pain. Replaces:  oxyCODONE-acetaminophen 5-325 MG tablet   QUEtiapine 100 MG tablet Commonly known as:  SEROQUEL Take 100 mg by mouth at bedtime.       Diagnostic Studies: Dg Thoracic Spine 2 View  Result Date: 11/24/2016 CLINICAL DATA:  Spinal cord stimulator placement. EXAM: DG C-ARM 61-120 MIN; THORACIC SPINE  2 VIEWS COMPARISON:  No prior. FINDINGS: Spinal cord stimulator is noted with this upper tips at the level of the lower thoracic spine. No acute bony abnormality. A catheter is noted over the left upper abdomen and lower chest. IMPRESSION: Small cord stimulator noted with its lead tips over the lower thoracic spine. Electronically Signed   By: Marcello Moores  Register   On: 11/24/2016 10:38   Dg C-arm 1-60 Min  Result Date: 11/24/2016 CLINICAL DATA:  Spinal cord stimulator placement.  EXAM: DG C-ARM 61-120 MIN; THORACIC SPINE 2 VIEWS COMPARISON:  No prior. FINDINGS: Spinal cord stimulator is noted with this upper tips at the level of the lower thoracic spine. No acute bony abnormality. A catheter is noted over the left upper abdomen and lower chest. IMPRESSION: Small cord stimulator noted with its lead tips over the lower thoracic spine. Electronically Signed   By: Marcello Moores  Register   On: 11/24/2016 10:38    Discharge Instructions    Incentive spirometry RT    Complete by:  As directed       Follow-up Information    Melina Schools, MD. Schedule an appointment as soon as possible for a visit in 2 weeks.   Specialty:  Orthopedic Surgery Why:  If symptoms worsen, For suture removal, For wound re-check Contact information: [address] [place] [address] [phone]           Discharge Plan:  discharge to home.  Pt will present to clinic in two weeks.  Pt will continue to get pain medication from Dr. Nelva Bush.  Pt met with SCS rep before DC.  Disposition:     Signed: MayoDarla Lesches for Dr. Melina Schools [facility] [phone] 12/14/2016, 1:38 PM

## 2017-03-28 DIAGNOSIS — Z79891 Long term (current) use of opiate analgesic: Secondary | ICD-10-CM | POA: Insufficient documentation

## 2017-06-15 NOTE — Pre-Procedure Instructions (Signed)
MAGARET JUSTO  06/15/2017      [id number] - [place],  - [address] AT [facility] [address] [place] [address] Phone: [phone] Fax: [phone]    Your procedure is scheduled on June 28, 2017.  Report to [facility] Admitting at 815 AM.  Call this number if you have problems the morning of surgery:  [phone]   Remember:  Do not eat food or drink liquids after midnight.  Take these medicines the morning of surgery with A SIP OF WATER flonase nasal spray-if needed Oxycodone-acetaminophen-if needed for pain  7 days prior to surgery STOP taking any Aspirin (unless otherwise instructed by your surgeon), Aleve, Naproxen, Ibuprofen, Motrin, Advil, Goody's, BC's, all herbal medications, fish oil, and all vitamins  Continue all other medications as instructed by your physician except follow the above medication instructions before surgery  Contacts, dentures or bridgework may not be worn into surgery.  Leave your suitcase in the car.  After surgery it may be brought to your room.  For patients admitted to the hospital, discharge time will be determined by your treatment team.  Patients discharged the day of surgery will not be allowed to drive home.    [place]- Preparing For Surgery  Before surgery, you can play an important role. Because skin is not sterile, your skin needs to be as free of germs as possible. You can reduce the number of germs on your skin by washing with CHG (chlorahexidine gluconate) Soap before surgery.  CHG is an antiseptic cleaner which kills germs and bonds with the skin to continue killing germs even after washing.  Please do not use if you have an allergy to CHG or antibacterial soaps. If your skin becomes reddened/irritated stop using the CHG.  Do not shave (including legs and underarms) for at least 48 hours prior to first CHG shower. It is OK to shave your face.  Please follow these  instructions carefully.   1. Shower the NIGHT BEFORE SURGERY and the MORNING OF SURGERY with CHG.   2. If you chose to wash your hair, wash your hair first as usual with your normal shampoo.  3. After you shampoo, rinse your hair and body thoroughly to remove the shampoo.  4. Use CHG as you would any other liquid soap. You can apply CHG directly to the skin and wash gently with a scrungie or a clean washcloth.   5. Apply the CHG Soap to your body ONLY FROM THE NECK DOWN.  Do not use on open wounds or open sores. Avoid contact with your eyes, ears, mouth and genitals (private parts). Wash Face and genitals (private parts)  with your normal soap.  6. Wash thoroughly, paying special attention to the area where your surgery will be performed.  7. Thoroughly rinse your body with warm water from the neck down.  8. DO NOT shower/wash with your normal soap after using and rinsing off the CHG Soap.  9. Pat yourself dry with a CLEAN TOWEL.  10. Wear CLEAN PAJAMAS to bed the night before surgery, wear comfortable clothes the morning of surgery  11. Place CLEAN SHEETS on your bed the night of your first shower and DO NOT SLEEP WITH PETS.   Day of Surgery: Do not apply any deodorants/lotions. Please wear clean clothes to the hospital/surgery center.               Do not wear  jewelry, make-up or nail polish.  Do not wear lotions, powders, or perfumes, or deodorant.  Do not shave 48 hours prior to surgery.    Do not bring valuables to the hospital.  [facility] is not responsible for any belongings or valuables.  Please read over the following fact sheets that you were given. Pain Booklet, Coughing and Deep Breathing, MRSA Information and Surgical Site Infection Prevention

## 2017-06-16 ENCOUNTER — Other Ambulatory Visit: Payer: Self-pay

## 2017-06-16 ENCOUNTER — Encounter (HOSPITAL_COMMUNITY): Payer: Self-pay

## 2017-06-16 ENCOUNTER — Encounter (HOSPITAL_COMMUNITY)
Admission: RE | Admit: 2017-06-16 | Discharge: 2017-06-16 | Disposition: A | Discharge status: Home / Self Care | Payer: Medicare Other | Source: Ambulatory Visit | Attending: Orthopedic Surgery | Admitting: Orthopedic Surgery

## 2017-06-16 DIAGNOSIS — Z01812 Encounter for preprocedural laboratory examination: Secondary | ICD-10-CM | POA: Diagnosis present

## 2017-06-16 LAB — CBC
HCT: 36.4 % (ref 36.0–46.0)
HEMOGLOBIN: 11.4 g/dL — AB (ref 12.0–15.0)
MCH: 27.7 pg (ref 26.0–34.0)
MCHC: 31.3 g/dL (ref 30.0–36.0)
MCV: 88.3 fL (ref 78.0–100.0)
PLATELETS: 342 10*3/uL (ref 150–400)
RBC: 4.12 MIL/uL (ref 3.87–5.11)
RDW: 14.8 % (ref 11.5–15.5)
WBC: 6.4 10*3/uL (ref 4.0–10.5)

## 2017-06-16 LAB — SURGICAL PCR SCREEN
MRSA, PCR: NEGATIVE
Staphylococcus aureus: NEGATIVE

## 2017-06-16 NOTE — Progress Notes (Addendum)
PCP: [facility]  Cardiologist: pt denies  EKG: in the past year, requested from [facility]  Stress test: pt denies  ECHO: pt denies  Cardiac Cath: pt denies  Chest x-ray: in the past year, requested from [facility]

## 2017-06-19 NOTE — Progress Notes (Signed)
Anesthesia Chart Review:  Pt is a 57 year old female scheduled for lumbar spinal cord stimulator battery removal on 06/28/2017 with Venita Lick, MD  - Receives primary care at [facility]  PMH reviewed. Never smoker. BMI 33.5. S/p lumbar spinal cord stimulator insertion 11/24/16.   Medications reviewed.  BP 114/78   Pulse 69   Temp 36.5 C   Resp 20   Ht 5\' 1"  (1.549 m)   Wt 177 lb 12.8 oz (80.6 kg)   SpO2 98%   BMI 33.60 kg/m    Preoperative labs reviewed.    CXR 03/27/17 [facility]): Minimal lingular atelectasis, infiltrate or scar  EKGN/A  If no changes, I anticipate pt can proceed with surgery as scheduled.   [facility], FNP-BC [facility] Short Stay Surgical Center/Anesthesiology Phone: [phone] 06/27/2017 9:46 AM

## 2017-06-21 NOTE — H&P (Addendum)
Patient ID: Brittney Carter MRN: [id number] DOB/AGE: [birth year] 57 y.o.  Admit date: (Not on file)  Admission Diagnoses:  Spinal cord stimulator dysfunction  HPI: The patient is here today for a pre-operative History and Physical. They are scheduled for removal SCS  on 06-28-17 with [name] at [facility].  The pt reports a hx of good health.  Past Medical History: Past Medical History:  Diagnosis Date  . Arthritis    Back  . Depression    no medication  . Dyspnea    with exertion     Surgical History: Past Surgical History:  Procedure Laterality Date  . BACK SURGERY  2016   Laminectomy  . BACK SURGERY  1991   Disectomy  . BREAST SURGERY     Breast lift  . COLONOSCOPY W/ POLYPECTOMY     x 2  . Pain stimulator trail    . SPINAL CORD STIMULATOR INSERTION N/A 11/24/2016   Procedure: LUMBAR SPINAL CORD STIMULATOR INSERTION;  Surgeon: Melina Schools, MD;  Location: [place];  Service: Orthopedics;  Laterality: N/A;  2.5 hrs  . TONSILLECTOMY     age 74    Family History: No family history on file.  Social History: Social History   Socioeconomic History  . Marital status: Divorced    Spouse name: Not on file  . Number of children: Not on file  . Years of education: Not on file  . Highest education level: Not on file  Occupational History  . Not on file  Social Needs  . Financial resource strain: Not on file  . Food insecurity:    Worry: Not on file    Inability: Not on file  . Transportation needs:    Medical: Not on file    Non-medical: Not on file  Tobacco Use  . Smoking status: Never Smoker  . Smokeless tobacco: Never Used  Substance and Sexual Activity  . Alcohol use: No  . Drug use: No  . Sexual activity: Not on file  Lifestyle  . Physical activity:    Days per week: Not on file    Minutes per session: Not on file  . Stress: Not on file  Relationships  . Social connections:    Talks on phone: Not on file    Gets together: Not  on file    Attends religious service: Not on file    Active member of club or organization: Not on file    Attends meetings of clubs or organizations: Not on file    Relationship status: Not on file  . Intimate partner violence:    Fear of current or ex partner: Not on file    Emotionally abused: Not on file    Physically abused: Not on file    Forced sexual activity: Not on file  Other Topics Concern  . Not on file  Social History Narrative  . Not on file    Allergies: Morphine and Tramadol  Medications: I have reviewed the patient's current medications.  Vital Signs: No data found.  Radiology: No results found.  Labs: No results for input(s): WBC, RBC, HCT, PLT in the last 72 hours. No results for input(s): NA, K, CL, CO2, BUN, CREATININE, GLUCOSE, CALCIUM in the last 72 hours. No results for input(s): LABPT, INR in the last 72 hours.  Review of Systems: ROS  Physical Exam: There is no height or weight on file to calculate BMI.  Physical Exam  Constitutional: She is oriented  to person, place, and time. She appears well-developed and well-nourished.  HENT:  Head: Normocephalic.  Eyes: Pupils are equal, round, and reactive to light.  Neck: Normal range of motion.  Cardiovascular: Normal rate and regular rhythm.  Respiratory: Effort normal and breath sounds normal.  GI: Soft. Bowel sounds are normal.  Neurological: She is alert and oriented to person, place, and time.  Skin: Skin is warm and dry.  Psychiatric: She has a normal mood and affect. Her behavior is normal. Judgment and thought content normal.   Lumbar spine: Patient has chronic low back pain.  Decreased range of motion due to her pain.  Patient has a left gluteal battery site which is painful to the touch.  Does not appear to have rotational deformity of the battery. Neuro: Patient is ambulating with no assistive devices.  Normal gait pattern.  She has no focal motor deficits in the lower extremity.  She  does complain of dysesthesias in both lower extremities which is her chronic issue.  1+ symmetrical deep tendon reflexes in the lower extremity.  Negative nerve root tension signs in the lower extremity. Reflexes: Babinski: Negative Hoffman: Negative, no clonus. Vascular: Lower extremity peripheral pulses are 1+ and symmetrical.  Compartments are soft and nontender  Imaging studies: The leads do not appear to have significantly migrated.  X-rays from 11/24/16 were compared to the December 2018 x-rays.   Assessment and Plan: Risks and benefits of surgery were discussed with the patient. These include: Infection, bleeding, death, stroke, paralysis, ongoing or worse pain, need for additional surgery, leak of spinal fluid.  Goal of surgery: Reproduce pain relief obtained during the trial. Improved quality of life.  Ronette Deter, PAC for Melina Schools, MD [facility] [phone]  Patient continues to have significant pain at the battery site.  There is no signs of infection.  Clinical exam is essentially unchanged.  Plan on moving forward with removal of the battery.  Patient will be discharged to home later on today.  I again reviewed the risks and benefits of surgery with the patient and her husband and all their questions were addressed.

## 2017-06-28 ENCOUNTER — Ambulatory Visit (HOSPITAL_COMMUNITY): Payer: Medicare Other | Admitting: Anesthesiology

## 2017-06-28 ENCOUNTER — Other Ambulatory Visit: Payer: Self-pay

## 2017-06-28 ENCOUNTER — Encounter (HOSPITAL_COMMUNITY)
Admission: RE | Disposition: A | Discharge status: Home / Self Care | Payer: Self-pay | Source: Ambulatory Visit | Attending: Orthopedic Surgery

## 2017-06-28 ENCOUNTER — Ambulatory Visit (HOSPITAL_COMMUNITY)
Admission: RE | Admit: 2017-06-28 | Discharge: 2017-06-28 | Disposition: A | Discharge status: Home / Self Care | Payer: Medicare Other | Source: Ambulatory Visit | Attending: Orthopedic Surgery | Admitting: Orthopedic Surgery

## 2017-06-28 ENCOUNTER — Ambulatory Visit (HOSPITAL_COMMUNITY): Payer: Medicare Other | Admitting: Emergency Medicine

## 2017-06-28 ENCOUNTER — Encounter (HOSPITAL_COMMUNITY): Payer: Self-pay | Admitting: *Deleted

## 2017-06-28 DIAGNOSIS — F329 Major depressive disorder, single episode, unspecified: Secondary | ICD-10-CM | POA: Insufficient documentation

## 2017-06-28 DIAGNOSIS — T85840A Pain due to nervous system prosthetic devices, implants and grafts, initial encounter: Secondary | ICD-10-CM | POA: Insufficient documentation

## 2017-06-28 DIAGNOSIS — Y758 Miscellaneous neurological devices associated with adverse incidents, not elsewhere classified: Secondary | ICD-10-CM | POA: Insufficient documentation

## 2017-06-28 DIAGNOSIS — Z79899 Other long term (current) drug therapy: Secondary | ICD-10-CM | POA: Diagnosis not present

## 2017-06-28 DIAGNOSIS — Y92009 Unspecified place in unspecified non-institutional (private) residence as the place of occurrence of the external cause: Secondary | ICD-10-CM | POA: Diagnosis not present

## 2017-06-28 HISTORY — PX: SPINAL CORD STIMULATOR REMOVAL: SHX5379

## 2017-06-28 SURGERY — LUMBAR SPINAL CORD STIMULATOR REMOVAL
Anesthesia: General

## 2017-06-28 MED ORDER — METOCLOPRAMIDE HCL 5 MG/ML IJ SOLN: 5.0000 mg | Freq: Three times a day (TID) | INTRAMUSCULAR | Status: DC | PRN

## 2017-06-28 MED ORDER — METHOCARBAMOL 500 MG PO TABS
ORAL_TABLET | ORAL | Status: AC
  Filled 2017-06-28: qty 1

## 2017-06-28 MED ORDER — BUPIVACAINE-EPINEPHRINE 0.25% -1:200000 IJ SOLN
INTRAMUSCULAR | Status: DC | PRN
  Administered 2017-06-28: 30 mL

## 2017-06-28 MED ORDER — OXYCODONE HCL 5 MG PO TABS
5.0000 mg | ORAL_TABLET | ORAL | Status: DC | PRN
  Administered 2017-06-28: 10 mg via ORAL

## 2017-06-28 MED ORDER — FENTANYL CITRATE (PF) 100 MCG/2ML IJ SOLN
INTRAMUSCULAR | Status: AC
  Filled 2017-06-28: qty 2

## 2017-06-28 MED ORDER — ONDANSETRON HCL 4 MG/2ML IJ SOLN
INTRAMUSCULAR | Status: DC | PRN
  Administered 2017-06-28: 4 mg via INTRAVENOUS

## 2017-06-28 MED ORDER — PROPOFOL 10 MG/ML IV BOLUS
INTRAVENOUS | Status: DC | PRN
  Administered 2017-06-28: 170 mg via INTRAVENOUS

## 2017-06-28 MED ORDER — SUGAMMADEX SODIUM 200 MG/2ML IV SOLN
INTRAVENOUS | Status: AC
  Filled 2017-06-28: qty 2

## 2017-06-28 MED ORDER — BUPIVACAINE LIPOSOME 1.3 % IJ SUSP
INTRAMUSCULAR | Status: DC | PRN
  Administered 2017-06-28: 20 mL

## 2017-06-28 MED ORDER — LIDOCAINE HCL (CARDIAC) PF 100 MG/5ML IV SOSY: PREFILLED_SYRINGE | INTRAVENOUS | Status: DC | PRN

## 2017-06-28 MED ORDER — CEFAZOLIN SODIUM-DEXTROSE 2-4 GM/100ML-% IV SOLN
2.0000 g | INTRAVENOUS | Status: AC
  Administered 2017-06-28: 2 g via INTRAVENOUS
  Filled 2017-06-28: qty 100

## 2017-06-28 MED ORDER — MIDAZOLAM HCL 2 MG/2ML IJ SOLN
INTRAMUSCULAR | Status: AC
  Filled 2017-06-28: qty 2

## 2017-06-28 MED ORDER — DEXAMETHASONE SODIUM PHOSPHATE 10 MG/ML IJ SOLN
INTRAMUSCULAR | Status: DC | PRN
  Administered 2017-06-28: 10 mg via INTRAVENOUS

## 2017-06-28 MED ORDER — ONDANSETRON 4 MG PO TBDP: 4.0000 mg | ORAL_TABLET | Freq: Three times a day (TID) | ORAL | 0 refills | Status: DC | PRN

## 2017-06-28 MED ORDER — ONDANSETRON HCL 4 MG/2ML IJ SOLN: 4.0000 mg | Freq: Once | INTRAMUSCULAR | Status: DC | PRN

## 2017-06-28 MED ORDER — PHENYLEPHRINE 40 MCG/ML (10ML) SYRINGE FOR IV PUSH (FOR BLOOD PRESSURE SUPPORT)
PREFILLED_SYRINGE | INTRAVENOUS | Status: AC
  Filled 2017-06-28: qty 10

## 2017-06-28 MED ORDER — SUGAMMADEX SODIUM 200 MG/2ML IV SOLN
INTRAVENOUS | Status: DC | PRN
  Administered 2017-06-28: 200 mg via INTRAVENOUS

## 2017-06-28 MED ORDER — LIDOCAINE 2% (20 MG/ML) 5 ML SYRINGE
INTRAMUSCULAR | Status: AC
  Filled 2017-06-28: qty 5

## 2017-06-28 MED ORDER — BUPIVACAINE-EPINEPHRINE (PF) 0.25% -1:200000 IJ SOLN
INTRAMUSCULAR | Status: AC
  Filled 2017-06-28: qty 30

## 2017-06-28 MED ORDER — DEXAMETHASONE SODIUM PHOSPHATE 10 MG/ML IJ SOLN
INTRAMUSCULAR | Status: AC
  Filled 2017-06-28: qty 1

## 2017-06-28 MED ORDER — MORPHINE SULFATE (PF) 2 MG/ML IV SOLN: 1.0000 mg | INTRAVENOUS | Status: DC | PRN

## 2017-06-28 MED ORDER — PHENYLEPHRINE HCL 10 MG/ML IJ SOLN
INTRAMUSCULAR | Status: DC | PRN
  Administered 2017-06-28 (×6): 80 ug via INTRAVENOUS

## 2017-06-28 MED ORDER — ONDANSETRON HCL 4 MG/2ML IJ SOLN: 4.0000 mg | Freq: Four times a day (QID) | INTRAMUSCULAR | Status: DC | PRN

## 2017-06-28 MED ORDER — DIPHENHYDRAMINE HCL 50 MG/ML IJ SOLN
INTRAMUSCULAR | Status: DC | PRN
  Administered 2017-06-28: 12.5 mg via INTRAVENOUS

## 2017-06-28 MED ORDER — FENTANYL CITRATE (PF) 100 MCG/2ML IJ SOLN
INTRAMUSCULAR | Status: DC | PRN
  Administered 2017-06-28: 50 ug via INTRAVENOUS
  Administered 2017-06-28 (×2): 25 ug via INTRAVENOUS
  Administered 2017-06-28 (×2): 50 ug via INTRAVENOUS
  Administered 2017-06-28 (×2): 25 ug via INTRAVENOUS

## 2017-06-28 MED ORDER — LIDOCAINE HCL (CARDIAC) PF 100 MG/5ML IV SOSY
PREFILLED_SYRINGE | INTRAVENOUS | Status: DC | PRN
  Administered 2017-06-28: 50 mg via INTRAVENOUS

## 2017-06-28 MED ORDER — ONDANSETRON HCL 4 MG/2ML IJ SOLN
INTRAMUSCULAR | Status: AC
  Filled 2017-06-28: qty 2

## 2017-06-28 MED ORDER — HYDROCODONE-ACETAMINOPHEN 10-325 MG PO TABS: 1.0000 | ORAL_TABLET | ORAL | Status: DC | PRN

## 2017-06-28 MED ORDER — ACETAMINOPHEN 10 MG/ML IV SOLN
1000.0000 mg | INTRAVENOUS | Status: AC
  Administered 2017-06-28: 1000 mg via INTRAVENOUS
  Filled 2017-06-28: qty 100

## 2017-06-28 MED ORDER — MIDAZOLAM HCL 5 MG/5ML IJ SOLN
INTRAMUSCULAR | Status: DC | PRN
  Administered 2017-06-28: 2 mg via INTRAVENOUS

## 2017-06-28 MED ORDER — FENTANYL CITRATE (PF) 100 MCG/2ML IJ SOLN
25.0000 ug | INTRAMUSCULAR | Status: DC | PRN
  Administered 2017-06-28: 50 ug via INTRAVENOUS

## 2017-06-28 MED ORDER — METHOCARBAMOL 500 MG PO TABS
500.0000 mg | ORAL_TABLET | Freq: Four times a day (QID) | ORAL | Status: DC | PRN
  Administered 2017-06-28: 500 mg via ORAL

## 2017-06-28 MED ORDER — METHOCARBAMOL 1000 MG/10ML IJ SOLN: 500.0000 mg | Freq: Four times a day (QID) | INTRAVENOUS | Status: DC | PRN

## 2017-06-28 MED ORDER — ONDANSETRON HCL 4 MG PO TABS: 4.0000 mg | ORAL_TABLET | Freq: Four times a day (QID) | ORAL | Status: DC | PRN

## 2017-06-28 MED ORDER — OXYCODONE HCL 5 MG PO TABS
ORAL_TABLET | ORAL | Status: AC
  Filled 2017-06-28: qty 2

## 2017-06-28 MED ORDER — ROCURONIUM BROMIDE 100 MG/10ML IV SOLN
INTRAVENOUS | Status: DC | PRN
  Administered 2017-06-28: 50 mg via INTRAVENOUS

## 2017-06-28 MED ORDER — LACTATED RINGERS IV SOLN
INTRAVENOUS | Status: DC
  Administered 2017-06-28 (×2): via INTRAVENOUS

## 2017-06-28 MED ORDER — PROPOFOL 10 MG/ML IV BOLUS
INTRAVENOUS | Status: AC
Start: 2017-06-28 — End: 2017-06-28
  Filled 2017-06-28: qty 20

## 2017-06-28 MED ORDER — ROCURONIUM BROMIDE 10 MG/ML (PF) SYRINGE
PREFILLED_SYRINGE | INTRAVENOUS | Status: AC
  Filled 2017-06-28: qty 5

## 2017-06-28 MED ORDER — FENTANYL CITRATE (PF) 250 MCG/5ML IJ SOLN
INTRAMUSCULAR | Status: AC
  Filled 2017-06-28: qty 5

## 2017-06-28 MED ORDER — METOCLOPRAMIDE HCL 5 MG PO TABS: 5.0000 mg | ORAL_TABLET | Freq: Three times a day (TID) | ORAL | Status: DC | PRN

## 2017-06-28 SURGICAL SUPPLY — 46 items
CANISTER SUCT 3000ML PPV (MISCELLANEOUS) ×3 IMPLANT
CLOSURE STERI-STRIP 1/2X4 (GAUZE/BANDAGES/DRESSINGS) ×1
CLOSURE WOUND 1/2 X4 (GAUZE/BANDAGES/DRESSINGS) ×1
CLSR STERI-STRIP ANTIMIC 1/2X4 (GAUZE/BANDAGES/DRESSINGS) ×2 IMPLANT
DRAPE INCISE IOBAN 85X60 (DRAPES) ×3 IMPLANT
DRAPE SURG 17X23 STRL (DRAPES) ×3 IMPLANT
DRAPE U-SHAPE 47X51 STRL (DRAPES) ×3 IMPLANT
DRSG AQUACEL AG ADV 3.5X 4 (GAUZE/BANDAGES/DRESSINGS) ×6 IMPLANT
DRSG OPSITE POSTOP 4X6 (GAUZE/BANDAGES/DRESSINGS) ×2 IMPLANT
DURAPREP 26ML APPLICATOR (WOUND CARE) ×3 IMPLANT
ELECT CAUTERY BLADE 6.4 (BLADE) ×3 IMPLANT
ELECT PENCIL ROCKER SW 15FT (MISCELLANEOUS) ×3 IMPLANT
ELECT REM PT RETURN 9FT ADLT (ELECTROSURGICAL) ×3
ELECTRODE REM PT RTRN 9FT ADLT (ELECTROSURGICAL) ×1 IMPLANT
GLOVE BIO SURGEON STRL SZ 6.5 (GLOVE) ×2 IMPLANT
GLOVE BIO SURGEONS STRL SZ 6.5 (GLOVE) ×1
GLOVE BIOGEL PI IND STRL 6.5 (GLOVE) ×1 IMPLANT
GLOVE BIOGEL PI IND STRL 8.5 (GLOVE) ×1 IMPLANT
GLOVE BIOGEL PI INDICATOR 6.5 (GLOVE) ×2
GLOVE BIOGEL PI INDICATOR 8.5 (GLOVE) ×2
GLOVE SS BIOGEL STRL SZ 8.5 (GLOVE) ×2 IMPLANT
GLOVE SUPERSENSE BIOGEL SZ 8.5 (GLOVE) ×4
GOWN STRL REUS W/ TWL XL LVL3 (GOWN DISPOSABLE) ×2 IMPLANT
GOWN STRL REUS W/TWL 2XL LVL3 (GOWN DISPOSABLE) ×6 IMPLANT
GOWN STRL REUS W/TWL XL LVL3 (GOWN DISPOSABLE) ×6
KIT BASIN OR (CUSTOM PROCEDURE TRAY) ×3 IMPLANT
KIT TURNOVER KIT B (KITS) ×3 IMPLANT
NEEDLE 22X1 1/2 (OR ONLY) (NEEDLE) ×3 IMPLANT
NS IRRIG 1000ML POUR BTL (IV SOLUTION) ×3 IMPLANT
PACK LAMINECTOMY ORTHO (CUSTOM PROCEDURE TRAY) ×3 IMPLANT
PACK UNIVERSAL I (CUSTOM PROCEDURE TRAY) ×3 IMPLANT
SPONGE LAP 4X18 X RAY DECT (DISPOSABLE) IMPLANT
STAPLER VISISTAT 35W (STAPLE) ×3 IMPLANT
STRIP CLOSURE SKIN 1/2X4 (GAUZE/BANDAGES/DRESSINGS) ×1 IMPLANT
SURGIFLO W/THROMBIN 8M KIT (HEMOSTASIS) IMPLANT
SUT BONE WAX W31G (SUTURE) ×3 IMPLANT
SUT MON AB 3-0 SH 27 (SUTURE) ×6
SUT MON AB 3-0 SH27 (SUTURE) ×2 IMPLANT
SUT VIC AB 1 CT1 27 (SUTURE) ×9
SUT VIC AB 1 CT1 27XBRD ANBCTR (SUTURE) ×3 IMPLANT
SUT VIC AB 2-0 CT1 18 (SUTURE) ×6 IMPLANT
SYR BULB IRRIGATION 50ML (SYRINGE) ×3 IMPLANT
SYR CONTROL 10ML LL (SYRINGE) ×3 IMPLANT
TOWEL OR 17X24 6PK STRL BLUE (TOWEL DISPOSABLE) ×3 IMPLANT
TOWEL OR 17X26 10 PK STRL BLUE (TOWEL DISPOSABLE) ×3 IMPLANT
WATER STERILE IRR 1000ML POUR (IV SOLUTION) ×3 IMPLANT

## 2017-06-28 NOTE — Op Note (Signed)
Operative report  Preoperative diagnosis: Symptomatic orthopedic hardware -spinal cord stimulator battery  Postoperative diagnosis: Same  Operative procedure: Removal of spinal cord stimulator battery  Complications: None  Indications: This is a very pleasant 57 year old had a spinal cord stimulator placed for chronic pain syndrome in September 2018.  Patient states that recently the battery has become very painful, and she notes that it is not very helpful in alleviating her chronic pain.  Because of the severe pain at the battery site she expressed interest in having it removed.  After discussing all risks benefits and alternatives to surgery consent was obtained we elected to move forward with removal.  Operative report: Patient was brought the operating room placed on the operating table.  After successful induction of general anesthesia and endotracheal the patient teds SCDs were applied and she was turned into a lateral position on the beanbag.  Axillary roll was placed all bony prominences well-padded and the beanbag was inflated to hold the patient in the lateral decubitus position left side up.  The previous incision was infiltrated with a combination of quarter percent Marcaine with epinephrine and Exparel.  I also infiltrated the entire surrounding soft tissue area for postoperative analgesia.  The incision was re-incised and sharply dissected down to the level of the battery.  Identified the battery and then removed it.  I separated the leads and repacked them into the wound.  At this point I irrigated the wound copiously with normal saline and made sure hemostasis using electrocautery.  The wound was then closed in a layered fashion with interrupted #1 Vicryl suture, 2-0 Vicryl suture, and 3-0 Monocryl.  Steri-Strips dry dressings were applied and the patient was elderly extubated transferred the PACU without incident.  The end of the case all needle sponge counts were correct.  No adverse  intraoperative events.

## 2017-06-28 NOTE — Transfer of Care (Signed)
Immediate Anesthesia Transfer of Care Note  Patient: Arville Care  Procedure(s) Performed: Spinal cord stimulator battery removal (N/A )  Patient Location: PACU  Anesthesia Type:General  Level of Consciousness: awake, oriented and patient cooperative  Airway & Oxygen Therapy: Patient Spontanous Breathing and Patient connected to nasal cannula oxygen  Post-op Assessment: Report given to RN and Post -op Vital signs reviewed and stable  Post vital signs: Reviewed  Last Vitals:  Vitals Value Taken Time  BP 122/86 06/28/2017 12:00 PM  Temp 36.3 C 06/28/2017 12:00 PM  Pulse 65 06/28/2017 12:03 PM  Resp 11 06/28/2017 12:03 PM  SpO2 100 % 06/28/2017 12:03 PM  Vitals shown include unvalidated device data.  Last Pain:  Vitals:   06/28/17 0826  TempSrc:   PainSc: 8       Patients Stated Pain Goal: 2 (06/28/17 7939)  Complications: No apparent anesthesia complications

## 2017-06-28 NOTE — Anesthesia Procedure Notes (Signed)
Procedure Name: Intubation Date/Time: 06/28/2017 10:58 AM Performed by: Lovie Chol, CRNA Pre-anesthesia Checklist: Patient identified, Emergency Drugs available, Suction available and Patient being monitored Patient Re-evaluated:Patient Re-evaluated prior to induction Oxygen Delivery Method: Circle System Utilized Preoxygenation: Pre-oxygenation with 100% oxygen Induction Type: IV induction Ventilation: Mask ventilation without difficulty Laryngoscope Size: Miller and 2 Grade View: Grade I Tube type: Oral Tube size: 7.0 mm Number of attempts: 1 Airway Equipment and Method: Stylet and Oral airway Placement Confirmation: ETT inserted through vocal cords under direct vision,  positive ETCO2 and breath sounds checked- equal and bilateral Secured at: 21 cm Tube secured with: Tape Dental Injury: Teeth and Oropharynx as per pre-operative assessment

## 2017-06-28 NOTE — Anesthesia Preprocedure Evaluation (Signed)

## 2017-06-28 NOTE — Discharge Instructions (Signed)
Incision Care, Adult An incision is a surgical cut that is made through your skin. Most incisions are closed after surgery. Your incision may be closed with stitches (sutures), staples, skin glue, or adhesive strips. You may need to return to your health care provider to have sutures or staples removed. This may occur several days to several weeks after your surgery. The incision needs to be cared for properly to prevent infection. How to care for your incision Incision care   Follow instructions from your health care provider about how to take care of your incision. Make sure you: ? Wash your hands with soap and water before you change the bandage (dressing). If soap and water are not available, use hand sanitizer. ? Change your dressing as told by your health care provider. ? Leave sutures, skin glue, or adhesive strips in place. These skin closures may need to stay in place for 2 weeks or longer. If adhesive strip edges start to loosen and curl up, you may trim the loose edges. Do not remove adhesive strips completely unless your health care provider tells you to do that.  Check your incision area every day for signs of infection. Check for: ? More redness, swelling, or pain. ? More fluid or blood. ? Warmth. ? Pus or a bad smell.  Ask your health care provider how to clean the incision. This may include: ? Using mild soap and water. ? Using a clean towel to pat the incision dry after cleaning it. ? Applying a cream or ointment. Do this only as told by your health care provider. ? Covering the incision with a clean dressing.  Ask your health care provider when you can leave the incision uncovered.  Do not take baths, swim, or use a hot tub until your health care provider approves. Ask your health care provider if you can take showers. You may only be allowed to take sponge baths for bathing. Medicines  If you were prescribed an antibiotic medicine, cream, or ointment, take or apply the  antibiotic as told by your health care provider. Do not stop taking or applying the antibiotic even if your condition improves.  Take over-the-counter and prescription medicines only as told by your health care provider. General instructions  Limit movement around your incision to improve healing. ? Avoid straining, lifting, or exercise for the first month, or for as long as told by your health care provider. ? Follow instructions from your health care provider about returning to your normal activities. ? Ask your health care provider what activities are safe.  Protect your incision from the sun when you are outside for the first 6 months, or for as long as told by your health care provider. Apply sunscreen around the scar or cover it up.  Keep all follow-up visits as told by your health care provider. This is important. Contact a health care provider if:  Your have more redness, swelling, or pain around the incision.  You have more fluid or blood coming from the incision.  Your incision feels warm to the touch.  You have pus or a bad smell coming from the incision.  You have a fever or shaking chills.  You are nauseous or you vomit.  You are dizzy.  Your sutures or staples come undone. Get help right away if:  You have a red streak coming from your incision.  Your incision bleeds through the dressing and the bleeding does not stop with gentle pressure.  The edges of   your incision open up and separate.  You have severe pain.  You have a rash.  You are confused.  You faint.  You have trouble breathing and a fast heartbeat. This information is not intended to replace advice given to you by your health care provider. Make sure you discuss any questions you have with your health care provider. Document Released: 09/10/2004 Document Revised: 10/30/2015 Document Reviewed: 09/09/2015 Elsevier Interactive Patient Education  2018 Elsevier Inc.  

## 2017-06-28 NOTE — Brief Op Note (Signed)
06/28/2017  11:44 AM  PATIENT:  [name]  57 y.o. female  PRE-OPERATIVE DIAGNOSIS:  Symptomatic hardware  POST-OPERATIVE DIAGNOSIS:  Symptomatic hardware  PROCEDURE:  Procedure(s): Spinal cord stimulator battery removal (N/A)  SURGEON:  Surgeon(s) and Role:    Melina Schools, MD - Primary  PHYSICIAN ASSISTANT:   ASSISTANTS: none   ANESTHESIA:   general  EBL:  minimal   BLOOD ADMINISTERED:none  DRAINS: none   LOCAL MEDICATIONS USED:  MARCAINE    and OTHER exparel  SPECIMEN:  No Specimen  DISPOSITION OF SPECIMEN:  N/A  COUNTS:  YES  TOURNIQUET:  * No tourniquets in log *  DICTATION: .Dragon Dictation  PLAN OF CARE: Discharge to home after PACU  PATIENT DISPOSITION:  PACU - hemodynamically stable.

## 2017-06-29 NOTE — Anesthesia Postprocedure Evaluation (Signed)
Anesthesia Post Note  Patient: Brittney Carter  Procedure(s) Performed: Spinal cord stimulator battery removal (N/A )     Patient location during evaluation: PACU Anesthesia Type: General Level of consciousness: awake and alert Pain management: pain level controlled Vital Signs Assessment: post-procedure vital signs reviewed and stable Respiratory status: spontaneous breathing, nonlabored ventilation, respiratory function stable and patient connected to nasal cannula oxygen Cardiovascular status: blood pressure returned to baseline and stable Postop Assessment: no apparent nausea or vomiting Anesthetic complications: no    Last Vitals:  Vitals:   06/28/17 1241 06/28/17 1302  BP: 121/78 (!) 130/98  Pulse: 65 67  Resp: 10 18  Temp: 36.7 C   SpO2: 99% 95%    Last Pain:  Vitals:   06/28/17 1302  TempSrc:   PainSc: 3                  Tennis Mckinnon COKER

## 2017-06-30 ENCOUNTER — Encounter (HOSPITAL_COMMUNITY): Payer: Self-pay | Admitting: Orthopedic Surgery

## 2017-07-03 NOTE — Discharge Summary (Signed)
Physician Discharge Summary  Patient ID: Brittney Carter MRN: [id number] DOB/AGE: May 28, 1960 57 y.o.  Admit date: 06/28/2017 Discharge date: 06/28/17  Admission Diagnoses:  Symptomatic Hardware  Discharge Diagnoses:  Active Problems:   * No active hospital problems. *   Past Medical History:  Diagnosis Date  . Arthritis    Back  . Depression    no medication  . Dyspnea    with exertion     Surgeries: Procedure(s): Spinal cord stimulator battery removal on 06/28/2017   Consultants (if any):   Discharged Condition: Improved  Hospital Course: Brittney Carter is an 57 y.o. female who was admitted 06/28/2017 with a diagnosis of Symptomatic Hardware and went to the operating room on 06/28/2017 and underwent the above named procedures.  Spinal cord stimulator battery was removed.  Pt was taken to PACU.  After pt met discharge criteria she was discharged home with post op instructions.   She was given perioperative antibiotics:  Anti-infectives (From admission, onward)   Start     Dose/Rate Route Frequency Ordered Stop   06/28/17 0803  ceFAZolin (ANCEF) IVPB 2g/100 mL premix     2 g 200 mL/hr over 30 Minutes Intravenous 30 min pre-op 06/28/17 0803 06/28/17 1114    .  She was given sequential compression devices, early ambulation, and TED for DVT prophylaxis.  She benefited maximally from the hospital stay and there were no complications.    Recent vital signs:  Vitals:   06/28/17 1241 06/28/17 1302  BP: 121/78 (!) 130/98  Pulse: 65 67  Resp: 10 18  Temp: 98 F (36.7 C)   SpO2: 99% 95%    Recent laboratory studies:  Lab Results  Component Value Date   HGB 11.4 (L) 06/16/2017   HGB 11.7 (L) 11/22/2016   Lab Results  Component Value Date   WBC 6.4 06/16/2017   PLT 342 06/16/2017   No results found for: INR Lab Results  Component Value Date   NA 138 11/22/2016   K 4.1 11/22/2016   CL 108 11/22/2016   CO2 24 11/22/2016   BUN 14 11/22/2016   CREATININE  0.97 11/22/2016   GLUCOSE 97 11/22/2016    Discharge Medications:   Allergies as of 06/28/2017      Reactions   Morphine Anaphylaxis, Hives, Shortness Of Breath, Rash   Tramadol Anaphylaxis, Swelling, Rash   headache      Medication List    STOP taking these medications   ibuprofen 200 MG tablet Commonly known as:  ADVIL,MOTRIN     TAKE these medications   B COMPLEX 50 Tabs Take 1 tablet by mouth daily.   fluticasone 50 MCG/ACT nasal spray Commonly known as:  FLONASE Place 2 sprays into both nostrils daily as needed for allergies or rhinitis.   nystatin cream Commonly known as:  MYCOSTATIN Apply 1 application topically 2 (two) times daily as needed for rash.   ondansetron 4 MG disintegrating tablet Commonly known as:  ZOFRAN ODT Take 1 tablet (4 mg total) by mouth every 8 (eight) hours as needed.   oxyCODONE-acetaminophen 10-325 MG tablet Commonly known as:  PERCOCET Take 1 tablet by mouth every 4 (four) hours as needed for pain. What changed:  when to take this   QUEtiapine 25 MG tablet Commonly known as:  SEROQUEL Take 25 mg by mouth at bedtime as needed (sleep).   triamcinolone cream 0.1 % Commonly known as:  KENALOG Apply 1 application topically 2 (two) times daily as needed for rash.  Diagnostic Studies: No results found.  Disposition:  Pt will present to clinic in 2 weeks Post op meds provided Pt is in Pain management   Follow-up Ridgeley, Dahari, MD Follow up in 2 week(s).   Specialty:  Orthopedic Surgery Contact information: [address] [place] [address] [phone]            Signed: Valinda Hoar 07/03/2017, 9:16 AM

## 2017-10-04 ENCOUNTER — Telehealth: Payer: Self-pay | Admitting: Nurse Practitioner

## 2017-10-04 ENCOUNTER — Other Ambulatory Visit: Payer: Self-pay | Admitting: Physician Assistant

## 2017-10-04 DIAGNOSIS — M5416 Radiculopathy, lumbar region: Secondary | ICD-10-CM

## 2017-10-04 NOTE — Telephone Encounter (Signed)
Phone call to patient to verify medication list and allergies for myelogram procedure. Pt instructed to stop taking seroquel 48hrs prior to myelogram appointment time. Pt verbalized understanding.

## 2017-10-17 ENCOUNTER — Ambulatory Visit
Admission: RE | Admit: 2017-10-17 | Discharge: 2017-10-17 | Disposition: A | Discharge status: Home / Self Care | Payer: Medicare Other | Source: Ambulatory Visit | Attending: Physician Assistant | Admitting: Physician Assistant

## 2017-10-17 VITALS — BP 108/72 | HR 61

## 2017-10-17 DIAGNOSIS — M545 Low back pain, unspecified: Secondary | ICD-10-CM | POA: Insufficient documentation

## 2017-10-17 DIAGNOSIS — M5136 Other intervertebral disc degeneration, lumbar region: Secondary | ICD-10-CM

## 2017-10-17 DIAGNOSIS — M5416 Radiculopathy, lumbar region: Secondary | ICD-10-CM

## 2017-10-17 DIAGNOSIS — M4726 Other spondylosis with radiculopathy, lumbar region: Secondary | ICD-10-CM

## 2017-10-17 DIAGNOSIS — M546 Pain in thoracic spine: Secondary | ICD-10-CM

## 2017-10-17 MED ORDER — IOPAMIDOL (ISOVUE-M 200) INJECTION 41%
15.0000 mL | Freq: Once | INTRAMUSCULAR | Status: AC
  Administered 2017-10-17: 15 mL via INTRATHECAL

## 2017-10-17 MED ORDER — ONDANSETRON HCL 4 MG/2ML IJ SOLN: 4.0000 mg | Freq: Four times a day (QID) | INTRAMUSCULAR | Status: DC | PRN

## 2017-10-17 MED ORDER — DIAZEPAM 5 MG PO TABS
5.0000 mg | ORAL_TABLET | Freq: Once | ORAL | Status: AC
  Administered 2017-10-17: 5 mg via ORAL

## 2017-10-17 MED ORDER — IOPAMIDOL (ISOVUE-M 200) INJECTION 41%: 15.0000 mL | Freq: Once | INTRAMUSCULAR | Status: DC

## 2017-10-17 NOTE — Discharge Instructions (Signed)

## 2017-10-17 NOTE — Progress Notes (Signed)
Patient states she has been off Seroquel for at least the past two days. 

## 2017-10-20 ENCOUNTER — Telehealth: Payer: Self-pay

## 2017-10-20 NOTE — Telephone Encounter (Signed)
Patient called to report positional headache with "real bad nausea" after myelogram here 10/17/17.  I explained this most likely is a spinal headache and that her options were continued bedrest or an epidural blood patch.  I've called Dr. Shon Baton' office and spoke with [facility], PA-C, who actually had ordered the myelo.  She states she will fax over an order for the blood patch.  I called [place] back to let her know we were getting an order, but she is not able to find a driver today.  In fact, she needs to pick up her grandson at 2:00, and her daughter can't miss work, either to pick up the grandson or to drive her to our office, because "she will be fired if she misses any more work."  She is drinking some tea and staying in bed and will see Dr. Shon Baton Monday morning.

## 2017-10-23 ENCOUNTER — Other Ambulatory Visit: Payer: Self-pay | Admitting: Physician Assistant

## 2017-10-23 DIAGNOSIS — G971 Other reaction to spinal and lumbar puncture: Secondary | ICD-10-CM

## 2017-10-24 ENCOUNTER — Ambulatory Visit
Admission: RE | Admit: 2017-10-24 | Discharge: 2017-10-24 | Disposition: A | Discharge status: Home / Self Care | Payer: Medicare Other | Source: Ambulatory Visit | Attending: Physician Assistant | Admitting: Physician Assistant

## 2017-10-24 DIAGNOSIS — G971 Other reaction to spinal and lumbar puncture: Secondary | ICD-10-CM

## 2017-10-24 MED ORDER — IOPAMIDOL (ISOVUE-M 200) INJECTION 41%
1.0000 mL | Freq: Once | INTRAMUSCULAR | Status: AC
  Administered 2017-10-24: 1 mL via EPIDURAL

## 2017-10-24 MED ORDER — DIAZEPAM 5 MG PO TABS
5.0000 mg | ORAL_TABLET | Freq: Once | ORAL | Status: AC
  Administered 2017-10-24: 5 mg via ORAL

## 2017-10-24 NOTE — Discharge Instructions (Signed)

## 2019-04-09 DIAGNOSIS — M549 Dorsalgia, unspecified: Secondary | ICD-10-CM | POA: Diagnosis not present

## 2019-04-18 DIAGNOSIS — E785 Hyperlipidemia, unspecified: Secondary | ICD-10-CM | POA: Diagnosis not present

## 2019-04-18 DIAGNOSIS — G8929 Other chronic pain: Secondary | ICD-10-CM | POA: Diagnosis not present

## 2019-04-18 DIAGNOSIS — G894 Chronic pain syndrome: Secondary | ICD-10-CM | POA: Diagnosis not present

## 2019-04-18 DIAGNOSIS — R7303 Prediabetes: Secondary | ICD-10-CM | POA: Diagnosis not present

## 2019-04-18 DIAGNOSIS — Z0000 Encounter for general adult medical examination without abnormal findings: Secondary | ICD-10-CM | POA: Diagnosis not present

## 2019-04-18 DIAGNOSIS — D649 Anemia, unspecified: Secondary | ICD-10-CM | POA: Diagnosis not present

## 2019-04-18 DIAGNOSIS — Z1389 Encounter for screening for other disorder: Secondary | ICD-10-CM | POA: Diagnosis not present

## 2019-04-18 DIAGNOSIS — M549 Dorsalgia, unspecified: Secondary | ICD-10-CM | POA: Diagnosis not present

## 2019-04-18 DIAGNOSIS — G8918 Other acute postprocedural pain: Secondary | ICD-10-CM | POA: Diagnosis not present

## 2019-05-10 DIAGNOSIS — R1011 Right upper quadrant pain: Secondary | ICD-10-CM | POA: Diagnosis not present

## 2019-05-10 DIAGNOSIS — G8918 Other acute postprocedural pain: Secondary | ICD-10-CM | POA: Diagnosis not present

## 2019-05-10 DIAGNOSIS — R109 Unspecified abdominal pain: Secondary | ICD-10-CM | POA: Diagnosis not present

## 2019-05-10 DIAGNOSIS — G894 Chronic pain syndrome: Secondary | ICD-10-CM | POA: Diagnosis not present

## 2019-05-16 DIAGNOSIS — K7689 Other specified diseases of liver: Secondary | ICD-10-CM | POA: Diagnosis not present

## 2019-05-16 DIAGNOSIS — R933 Abnormal findings on diagnostic imaging of other parts of digestive tract: Secondary | ICD-10-CM | POA: Diagnosis not present

## 2019-05-28 DIAGNOSIS — K921 Melena: Secondary | ICD-10-CM | POA: Diagnosis not present

## 2019-05-28 DIAGNOSIS — K5903 Drug induced constipation: Secondary | ICD-10-CM | POA: Diagnosis not present

## 2019-05-28 DIAGNOSIS — R933 Abnormal findings on diagnostic imaging of other parts of digestive tract: Secondary | ICD-10-CM | POA: Diagnosis not present

## 2019-05-28 DIAGNOSIS — R109 Unspecified abdominal pain: Secondary | ICD-10-CM | POA: Diagnosis not present

## 2019-08-11 DIAGNOSIS — T887XXA Unspecified adverse effect of drug or medicament, initial encounter: Secondary | ICD-10-CM | POA: Diagnosis not present

## 2019-08-11 DIAGNOSIS — G8929 Other chronic pain: Secondary | ICD-10-CM | POA: Diagnosis not present

## 2019-08-11 DIAGNOSIS — R001 Bradycardia, unspecified: Secondary | ICD-10-CM | POA: Diagnosis not present

## 2019-08-11 DIAGNOSIS — M549 Dorsalgia, unspecified: Secondary | ICD-10-CM | POA: Diagnosis not present

## 2019-08-11 DIAGNOSIS — R531 Weakness: Secondary | ICD-10-CM | POA: Diagnosis not present

## 2019-08-11 DIAGNOSIS — Z743 Need for continuous supervision: Secondary | ICD-10-CM | POA: Diagnosis not present

## 2019-08-11 DIAGNOSIS — R110 Nausea: Secondary | ICD-10-CM | POA: Diagnosis not present

## 2019-08-11 DIAGNOSIS — Z79899 Other long term (current) drug therapy: Secondary | ICD-10-CM | POA: Diagnosis not present

## 2019-08-11 DIAGNOSIS — J9811 Atelectasis: Secondary | ICD-10-CM | POA: Diagnosis not present

## 2019-08-12 DIAGNOSIS — G8929 Other chronic pain: Secondary | ICD-10-CM | POA: Diagnosis not present

## 2019-08-12 DIAGNOSIS — E785 Hyperlipidemia, unspecified: Secondary | ICD-10-CM | POA: Diagnosis not present

## 2019-08-12 DIAGNOSIS — I10 Essential (primary) hypertension: Secondary | ICD-10-CM | POA: Diagnosis not present

## 2019-08-12 DIAGNOSIS — Z20822 Contact with and (suspected) exposure to covid-19: Secondary | ICD-10-CM | POA: Diagnosis not present

## 2019-08-12 DIAGNOSIS — I959 Hypotension, unspecified: Secondary | ICD-10-CM | POA: Diagnosis not present

## 2019-08-12 DIAGNOSIS — Z743 Need for continuous supervision: Secondary | ICD-10-CM | POA: Diagnosis not present

## 2019-08-12 DIAGNOSIS — M549 Dorsalgia, unspecified: Secondary | ICD-10-CM | POA: Diagnosis not present

## 2019-08-12 DIAGNOSIS — I499 Cardiac arrhythmia, unspecified: Secondary | ICD-10-CM | POA: Diagnosis not present

## 2019-08-12 DIAGNOSIS — T465X1A Poisoning by other antihypertensive drugs, accidental (unintentional), initial encounter: Secondary | ICD-10-CM | POA: Diagnosis not present

## 2019-08-12 DIAGNOSIS — K219 Gastro-esophageal reflux disease without esophagitis: Secondary | ICD-10-CM | POA: Diagnosis not present

## 2019-08-12 DIAGNOSIS — R001 Bradycardia, unspecified: Secondary | ICD-10-CM | POA: Diagnosis not present

## 2019-08-12 DIAGNOSIS — R300 Dysuria: Secondary | ICD-10-CM | POA: Diagnosis not present

## 2019-08-18 DIAGNOSIS — E785 Hyperlipidemia, unspecified: Secondary | ICD-10-CM | POA: Diagnosis not present

## 2019-08-18 DIAGNOSIS — K219 Gastro-esophageal reflux disease without esophagitis: Secondary | ICD-10-CM | POA: Diagnosis not present

## 2019-08-18 DIAGNOSIS — R030 Elevated blood-pressure reading, without diagnosis of hypertension: Secondary | ICD-10-CM | POA: Diagnosis not present

## 2019-08-18 DIAGNOSIS — M549 Dorsalgia, unspecified: Secondary | ICD-10-CM | POA: Diagnosis not present

## 2019-08-18 DIAGNOSIS — Z008 Encounter for other general examination: Secondary | ICD-10-CM | POA: Diagnosis not present

## 2019-08-27 DIAGNOSIS — M545 Low back pain: Secondary | ICD-10-CM | POA: Diagnosis not present

## 2019-08-27 DIAGNOSIS — G8929 Other chronic pain: Secondary | ICD-10-CM | POA: Diagnosis not present

## 2019-08-27 DIAGNOSIS — L259 Unspecified contact dermatitis, unspecified cause: Secondary | ICD-10-CM | POA: Diagnosis not present

## 2019-10-25 DIAGNOSIS — J329 Chronic sinusitis, unspecified: Secondary | ICD-10-CM | POA: Diagnosis not present

## 2019-11-14 DIAGNOSIS — J329 Chronic sinusitis, unspecified: Secondary | ICD-10-CM | POA: Diagnosis not present

## 2019-11-14 DIAGNOSIS — Z1152 Encounter for screening for COVID-19: Secondary | ICD-10-CM | POA: Diagnosis not present

## 2019-11-14 DIAGNOSIS — Z20822 Contact with and (suspected) exposure to covid-19: Secondary | ICD-10-CM | POA: Diagnosis not present

## 2019-11-14 DIAGNOSIS — R519 Headache, unspecified: Secondary | ICD-10-CM | POA: Diagnosis not present

## 2019-11-18 DIAGNOSIS — Z20822 Contact with and (suspected) exposure to covid-19: Secondary | ICD-10-CM | POA: Diagnosis not present

## 2019-11-19 DIAGNOSIS — R0981 Nasal congestion: Secondary | ICD-10-CM | POA: Diagnosis not present

## 2019-11-19 DIAGNOSIS — R519 Headache, unspecified: Secondary | ICD-10-CM | POA: Diagnosis not present

## 2019-11-19 DIAGNOSIS — J0100 Acute maxillary sinusitis, unspecified: Secondary | ICD-10-CM | POA: Diagnosis not present

## 2019-11-22 DIAGNOSIS — Z20822 Contact with and (suspected) exposure to covid-19: Secondary | ICD-10-CM | POA: Diagnosis not present

## 2019-11-22 DIAGNOSIS — J329 Chronic sinusitis, unspecified: Secondary | ICD-10-CM | POA: Diagnosis not present

## 2019-11-22 DIAGNOSIS — R519 Headache, unspecified: Secondary | ICD-10-CM | POA: Diagnosis not present

## 2019-12-29 DIAGNOSIS — S20211A Contusion of right front wall of thorax, initial encounter: Secondary | ICD-10-CM | POA: Diagnosis not present

## 2019-12-29 DIAGNOSIS — Z043 Encounter for examination and observation following other accident: Secondary | ICD-10-CM | POA: Diagnosis not present

## 2019-12-29 DIAGNOSIS — M79671 Pain in right foot: Secondary | ICD-10-CM | POA: Diagnosis not present

## 2019-12-29 DIAGNOSIS — M546 Pain in thoracic spine: Secondary | ICD-10-CM | POA: Diagnosis not present

## 2019-12-30 DIAGNOSIS — R0781 Pleurodynia: Secondary | ICD-10-CM | POA: Diagnosis not present

## 2019-12-30 DIAGNOSIS — R109 Unspecified abdominal pain: Secondary | ICD-10-CM | POA: Diagnosis not present

## 2019-12-30 DIAGNOSIS — I700 Atherosclerosis of aorta: Secondary | ICD-10-CM | POA: Diagnosis not present

## 2019-12-30 DIAGNOSIS — S20221A Contusion of right back wall of thorax, initial encounter: Secondary | ICD-10-CM | POA: Diagnosis not present

## 2019-12-30 DIAGNOSIS — M549 Dorsalgia, unspecified: Secondary | ICD-10-CM | POA: Diagnosis not present

## 2019-12-30 DIAGNOSIS — J90 Pleural effusion, not elsewhere classified: Secondary | ICD-10-CM | POA: Diagnosis not present

## 2020-01-01 DIAGNOSIS — R1011 Right upper quadrant pain: Secondary | ICD-10-CM | POA: Diagnosis not present

## 2020-01-04 DIAGNOSIS — R918 Other nonspecific abnormal finding of lung field: Secondary | ICD-10-CM | POA: Diagnosis not present

## 2020-01-04 DIAGNOSIS — R0602 Shortness of breath: Secondary | ICD-10-CM | POA: Diagnosis not present

## 2020-01-04 DIAGNOSIS — R0600 Dyspnea, unspecified: Secondary | ICD-10-CM | POA: Diagnosis not present

## 2020-01-04 DIAGNOSIS — J9811 Atelectasis: Secondary | ICD-10-CM | POA: Diagnosis not present

## 2020-01-04 DIAGNOSIS — S2231XA Fracture of one rib, right side, initial encounter for closed fracture: Secondary | ICD-10-CM | POA: Diagnosis not present

## 2020-01-04 DIAGNOSIS — J90 Pleural effusion, not elsewhere classified: Secondary | ICD-10-CM | POA: Diagnosis not present

## 2020-01-27 DIAGNOSIS — M5441 Lumbago with sciatica, right side: Secondary | ICD-10-CM | POA: Diagnosis not present

## 2020-01-27 DIAGNOSIS — M545 Low back pain, unspecified: Secondary | ICD-10-CM | POA: Diagnosis not present

## 2020-01-27 DIAGNOSIS — R0781 Pleurodynia: Secondary | ICD-10-CM | POA: Diagnosis not present

## 2020-01-27 DIAGNOSIS — Z8781 Personal history of (healed) traumatic fracture: Secondary | ICD-10-CM | POA: Diagnosis not present

## 2020-01-28 DIAGNOSIS — S299XXA Unspecified injury of thorax, initial encounter: Secondary | ICD-10-CM | POA: Diagnosis not present

## 2020-01-28 DIAGNOSIS — Z23 Encounter for immunization: Secondary | ICD-10-CM | POA: Diagnosis not present

## 2020-01-28 DIAGNOSIS — R200 Anesthesia of skin: Secondary | ICD-10-CM | POA: Diagnosis not present

## 2020-01-28 DIAGNOSIS — M2340 Loose body in knee, unspecified knee: Secondary | ICD-10-CM | POA: Diagnosis not present

## 2020-01-28 DIAGNOSIS — S8991XA Unspecified injury of right lower leg, initial encounter: Secondary | ICD-10-CM | POA: Diagnosis not present

## 2020-01-29 DIAGNOSIS — M25561 Pain in right knee: Secondary | ICD-10-CM | POA: Diagnosis not present

## 2020-01-29 DIAGNOSIS — R0781 Pleurodynia: Secondary | ICD-10-CM | POA: Diagnosis not present

## 2020-01-31 DIAGNOSIS — M25561 Pain in right knee: Secondary | ICD-10-CM | POA: Diagnosis not present

## 2020-01-31 DIAGNOSIS — M25461 Effusion, right knee: Secondary | ICD-10-CM | POA: Diagnosis not present

## 2020-02-02 DIAGNOSIS — M25561 Pain in right knee: Secondary | ICD-10-CM | POA: Diagnosis not present

## 2020-02-04 DIAGNOSIS — M25561 Pain in right knee: Secondary | ICD-10-CM | POA: Diagnosis not present

## 2020-02-04 DIAGNOSIS — M2351 Chronic instability of knee, right knee: Secondary | ICD-10-CM | POA: Diagnosis not present

## 2020-02-06 DIAGNOSIS — R200 Anesthesia of skin: Secondary | ICD-10-CM | POA: Diagnosis not present

## 2020-02-06 DIAGNOSIS — M25561 Pain in right knee: Secondary | ICD-10-CM | POA: Diagnosis not present

## 2020-02-10 DIAGNOSIS — R509 Fever, unspecified: Secondary | ICD-10-CM | POA: Diagnosis not present

## 2020-02-10 DIAGNOSIS — R112 Nausea with vomiting, unspecified: Secondary | ICD-10-CM | POA: Diagnosis not present

## 2020-02-10 DIAGNOSIS — Z743 Need for continuous supervision: Secondary | ICD-10-CM | POA: Diagnosis not present

## 2020-02-10 DIAGNOSIS — R0602 Shortness of breath: Secondary | ICD-10-CM | POA: Diagnosis not present

## 2020-02-10 DIAGNOSIS — R6889 Other general symptoms and signs: Secondary | ICD-10-CM | POA: Diagnosis not present

## 2020-02-10 DIAGNOSIS — R5383 Other fatigue: Secondary | ICD-10-CM | POA: Diagnosis not present

## 2020-02-10 DIAGNOSIS — R0789 Other chest pain: Secondary | ICD-10-CM | POA: Diagnosis not present

## 2020-02-10 DIAGNOSIS — G4489 Other headache syndrome: Secondary | ICD-10-CM | POA: Diagnosis not present

## 2020-02-10 DIAGNOSIS — R079 Chest pain, unspecified: Secondary | ICD-10-CM | POA: Diagnosis not present

## 2020-02-13 DIAGNOSIS — S83241A Other tear of medial meniscus, current injury, right knee, initial encounter: Secondary | ICD-10-CM | POA: Diagnosis not present

## 2020-02-13 DIAGNOSIS — M1711 Unilateral primary osteoarthritis, right knee: Secondary | ICD-10-CM | POA: Diagnosis not present

## 2020-02-19 DIAGNOSIS — M6751 Plica syndrome, right knee: Secondary | ICD-10-CM | POA: Diagnosis not present

## 2020-02-19 DIAGNOSIS — G8918 Other acute postprocedural pain: Secondary | ICD-10-CM | POA: Diagnosis not present

## 2020-02-19 DIAGNOSIS — M94261 Chondromalacia, right knee: Secondary | ICD-10-CM | POA: Diagnosis not present

## 2020-02-19 DIAGNOSIS — M65861 Other synovitis and tenosynovitis, right lower leg: Secondary | ICD-10-CM | POA: Diagnosis not present

## 2020-02-19 DIAGNOSIS — M659 Synovitis and tenosynovitis, unspecified: Secondary | ICD-10-CM | POA: Diagnosis not present

## 2020-02-19 DIAGNOSIS — S83231A Complex tear of medial meniscus, current injury, right knee, initial encounter: Secondary | ICD-10-CM | POA: Diagnosis not present

## 2020-04-16 DIAGNOSIS — Z7689 Persons encountering health services in other specified circumstances: Secondary | ICD-10-CM | POA: Diagnosis not present

## 2020-04-16 DIAGNOSIS — F32A Depression, unspecified: Secondary | ICD-10-CM | POA: Diagnosis not present

## 2020-04-16 DIAGNOSIS — E785 Hyperlipidemia, unspecified: Secondary | ICD-10-CM | POA: Diagnosis not present

## 2020-04-16 DIAGNOSIS — K625 Hemorrhage of anus and rectum: Secondary | ICD-10-CM | POA: Diagnosis not present

## 2020-04-16 DIAGNOSIS — Z131 Encounter for screening for diabetes mellitus: Secondary | ICD-10-CM | POA: Diagnosis not present

## 2020-04-28 DIAGNOSIS — M961 Postlaminectomy syndrome, not elsewhere classified: Secondary | ICD-10-CM | POA: Diagnosis not present

## 2020-04-28 DIAGNOSIS — R300 Dysuria: Secondary | ICD-10-CM | POA: Diagnosis not present

## 2020-04-28 DIAGNOSIS — E782 Mixed hyperlipidemia: Secondary | ICD-10-CM | POA: Diagnosis not present

## 2020-05-12 DIAGNOSIS — M545 Low back pain, unspecified: Secondary | ICD-10-CM | POA: Diagnosis not present

## 2020-05-12 DIAGNOSIS — G8929 Other chronic pain: Secondary | ICD-10-CM | POA: Diagnosis not present

## 2020-05-12 DIAGNOSIS — G894 Chronic pain syndrome: Secondary | ICD-10-CM | POA: Diagnosis not present

## 2020-05-12 DIAGNOSIS — M961 Postlaminectomy syndrome, not elsewhere classified: Secondary | ICD-10-CM | POA: Diagnosis not present

## 2020-05-12 DIAGNOSIS — Z79891 Long term (current) use of opiate analgesic: Secondary | ICD-10-CM | POA: Diagnosis not present

## 2020-05-12 DIAGNOSIS — M25561 Pain in right knee: Secondary | ICD-10-CM | POA: Diagnosis not present

## 2020-05-14 DIAGNOSIS — Z4789 Encounter for other orthopedic aftercare: Secondary | ICD-10-CM | POA: Diagnosis not present

## 2020-05-14 DIAGNOSIS — M1711 Unilateral primary osteoarthritis, right knee: Secondary | ICD-10-CM | POA: Diagnosis not present

## 2020-05-18 DIAGNOSIS — G8929 Other chronic pain: Secondary | ICD-10-CM | POA: Diagnosis not present

## 2020-05-18 DIAGNOSIS — M549 Dorsalgia, unspecified: Secondary | ICD-10-CM | POA: Diagnosis not present

## 2020-06-04 DIAGNOSIS — Z9889 Other specified postprocedural states: Secondary | ICD-10-CM | POA: Diagnosis not present

## 2020-06-04 DIAGNOSIS — M2351 Chronic instability of knee, right knee: Secondary | ICD-10-CM | POA: Diagnosis not present

## 2020-06-04 DIAGNOSIS — M1711 Unilateral primary osteoarthritis, right knee: Secondary | ICD-10-CM | POA: Diagnosis not present

## 2020-06-09 DIAGNOSIS — M25561 Pain in right knee: Secondary | ICD-10-CM | POA: Diagnosis not present

## 2020-06-09 DIAGNOSIS — M2351 Chronic instability of knee, right knee: Secondary | ICD-10-CM | POA: Diagnosis not present

## 2020-06-11 DIAGNOSIS — M1711 Unilateral primary osteoarthritis, right knee: Secondary | ICD-10-CM | POA: Diagnosis not present

## 2020-06-11 DIAGNOSIS — Z9889 Other specified postprocedural states: Secondary | ICD-10-CM | POA: Diagnosis not present

## 2020-06-11 DIAGNOSIS — M545 Low back pain, unspecified: Secondary | ICD-10-CM | POA: Diagnosis not present

## 2020-06-17 DIAGNOSIS — M25561 Pain in right knee: Secondary | ICD-10-CM | POA: Diagnosis not present

## 2020-06-17 DIAGNOSIS — G8929 Other chronic pain: Secondary | ICD-10-CM | POA: Diagnosis not present

## 2020-06-17 DIAGNOSIS — M545 Low back pain, unspecified: Secondary | ICD-10-CM | POA: Diagnosis not present

## 2020-06-17 DIAGNOSIS — Z1389 Encounter for screening for other disorder: Secondary | ICD-10-CM | POA: Diagnosis not present

## 2020-06-17 DIAGNOSIS — M549 Dorsalgia, unspecified: Secondary | ICD-10-CM | POA: Diagnosis not present

## 2020-06-17 DIAGNOSIS — G894 Chronic pain syndrome: Secondary | ICD-10-CM | POA: Diagnosis not present

## 2020-06-17 DIAGNOSIS — M961 Postlaminectomy syndrome, not elsewhere classified: Secondary | ICD-10-CM | POA: Diagnosis not present

## 2020-06-17 DIAGNOSIS — Z79891 Long term (current) use of opiate analgesic: Secondary | ICD-10-CM | POA: Diagnosis not present

## 2020-07-08 ENCOUNTER — Other Ambulatory Visit: Payer: Self-pay

## 2020-07-08 ENCOUNTER — Ambulatory Visit (INDEPENDENT_AMBULATORY_CARE_PROVIDER_SITE_OTHER): Payer: Medicare Other | Admitting: Orthopedic Surgery

## 2020-07-08 DIAGNOSIS — M25461 Effusion, right knee: Secondary | ICD-10-CM | POA: Diagnosis not present

## 2020-07-11 ENCOUNTER — Encounter: Payer: Self-pay | Admitting: Orthopedic Surgery

## 2020-07-11 MED ORDER — LIDOCAINE HCL 1 % IJ SOLN
5.0000 mL | INTRAMUSCULAR | Status: AC | PRN
  Administered 2020-07-08: 5 mL

## 2020-07-11 NOTE — Progress Notes (Signed)
Office Visit Note   Patient: Brittney Carter           Date of Birth: 01-Jul-1960           MRN: [id number] Visit Date: 07/08/2020 Requested by: [facility] [address] [place],  [place] [id number] PCP: [facility]  Subjective: Chief Complaint  Patient presents with  . Right Knee - Pain    HPI: Patient presents for second opinion about her right knee.  She underwent surgery done elsewhere 02/19/2020.  Those notes pictures are reviewed along with the previous MRI scan and subsequent MRI scan.  She had an injury where she fell off a stool and late last year.  Never had an injection but had MRI scan which showed possible meniscal pathology.  Arthroscopic pictures are definitely more convincing than the MRI scan in terms of confirming the meniscal pathology in the posterior horn on the medial side..  Did not have injection prior to that surgery.  Since the knee surgery she states "my knee never healed after surgery".  She did have a injection after surgery which gave her 2 days of relief.  She also went on oral prednisone for 7 days which gave her marginal relief.  She does report some sporadic fevers and chills.  She has had a more recent MRI scan from April which shows an effusion as well as some partial-thickness cartilage loss on the medial side and no real significant appreciable difference in meniscal morphology compared to the prior MRI scan before surgery.  She has a 74-year-old and 50-year-old at home.  She has had 5 back surgeries.              ROS: All systems reviewed are negative as they relate to the knee but she does report some sporadic fevers and chills over the past several weeks.  Assessment & Plan: Visit Diagnoses:  1. Effusion, right knee     Plan: Impression is persistent right knee pain following arthroscopy.  She does have an effusion in the knee today.  That effusion is aspirated and is sent for gram stain cell count culture and sensitivity and  crystals.  The fluid itself does not look overtly infected.  In general I see nothing really arthroscopically treatable in the knee following surgery.  Her presenting symptoms are mildly concerning for infection but I think that unlikely based on the appearance of the fluid.  In general I think she may be 1 of these patients who never really responds that great to arthroscopy and debridement of a degenerative meniscal tear.  They be something she has to live with.  I do think getting the quad stronger would be helpful.  I will call her with the results of the aspiration.  She is pretty discouraged at this point.  Follow-Up Instructions: Return if symptoms worsen or fail to improve.   Orders:  Orders Placed This Encounter  Procedures  . Gram stain  . Anaerobic and Aerobic Culture  . Cell count + diff,  w/ cryst-synvl fld  . Synovial Fluid Analysis, Complete   No orders of the defined types were placed in this encounter.     Procedures: Large Joint Inj: R knee on 07/08/2020 1:19 PM Indications: diagnostic evaluation, joint swelling and pain Details: 18 G 1.5 in needle, superolateral approach  Arthrogram: No  Medications: 5 mL lidocaine 1 % Aspirate: yellow; sent for lab analysis Outcome: tolerated well, no immediate complications Procedure, treatment alternatives, risks and benefits  explained, specific risks discussed. Consent was given by the patient. Immediately prior to procedure a time out was called to verify the correct patient, procedure, equipment, support staff and site/side marked as required. Patient was prepped and draped in the usual sterile fashion.       Clinical Data: No additional findings.  Objective: Vital Signs: There were no vitals taken for this visit.  Physical Exam:   Constitutional: Patient appears well-developed HEENT:  Head: Normocephalic Eyes:EOM are normal Neck: Normal range of motion Cardiovascular: Normal rate Pulmonary/chest: Effort  normal Neurologic: Patient is alert Skin: Skin is warm Psychiatric: Patient has normal mood and affect    Ortho Exam: Ortho exam demonstrates moderate effusion in the right knee.  No patellar apprehension.  No groin pain with internal extra rotation leg.  No nerve root tension signs.  Pedal pulses palpable.  Ankle dorsiflexion plantarflexion intact.  Collateral crucial ligaments are stable.  Mild medial and less lateral sided tenderness.  No coarse grinding or crepitus with active or passive range of motion of the knee.  No proximal lymphadenopathy.  Specialty Comments:  No specialty comments available.  Imaging: No results found.   PMFS History: Patient Active Problem List   Diagnosis Date Noted  . Back pain of thoracolumbar region 10/17/2017  . Long-term current use of opiate analgesic 03/28/2017  . Chronic pain 11/24/2016  . Cervical radiculopathy at C7 12/03/2015  . Sacroiliac joint pain 12/03/2015  . Chronic low back pain 11/25/2014  . Segmental and somatic dysfunction of pelvic region 10/28/2014  . Disc degeneration, lumbar 08/12/2014  . Lumbar radiculopathy 08/12/2014  . Lumbar stenosis 08/12/2014  . Osteoarthritis of spine with radiculopathy, lumbar region 08/12/2014   Past Medical History:  Diagnosis Date  . Arthritis    Back  . Depression    no medication  . Dyspnea    with exertion     History reviewed. No pertinent family history.  Past Surgical History:  Procedure Laterality Date  . BACK SURGERY  2016   Laminectomy  . BACK SURGERY  1991   Disectomy  . BREAST SURGERY     Breast lift  . COLONOSCOPY W/ POLYPECTOMY     x 2  . Pain stimulator trail    . SPINAL CORD STIMULATOR INSERTION N/A 11/24/2016   Procedure: LUMBAR SPINAL CORD STIMULATOR INSERTION;  Surgeon: Melina Schools, MD;  Location: [facility];  Service: Orthopedics;  Laterality: N/A;  2.5 hrs  . SPINAL CORD STIMULATOR REMOVAL N/A 06/28/2017   Procedure: Spinal cord stimulator battery removal;   Surgeon: Melina Schools, MD;  Location: [facility];  Service: Orthopedics;  Laterality: N/A;  . TONSILLECTOMY     age 9   Social History   Occupational History  . Not on file  Tobacco Use  . Smoking status: Never Smoker  . Smokeless tobacco: Never Used  Vaping Use  . Vaping Use: Never used  Substance and Sexual Activity  . Alcohol use: No  . Drug use: No  . Sexual activity: Not on file

## 2020-07-13 ENCOUNTER — Telehealth: Payer: Self-pay

## 2020-07-13 NOTE — Telephone Encounter (Signed)
patient called regarding her results from her labs she would like a call back explaining the meaning of her results call [phone]

## 2020-07-14 ENCOUNTER — Telehealth: Payer: Self-pay | Admitting: Orthopedic Surgery

## 2020-07-14 ENCOUNTER — Telehealth: Payer: Self-pay

## 2020-07-14 DIAGNOSIS — M1711 Unilateral primary osteoarthritis, right knee: Secondary | ICD-10-CM | POA: Diagnosis not present

## 2020-07-14 DIAGNOSIS — M25561 Pain in right knee: Secondary | ICD-10-CM | POA: Diagnosis not present

## 2020-07-14 DIAGNOSIS — G8929 Other chronic pain: Secondary | ICD-10-CM | POA: Diagnosis not present

## 2020-07-14 LAB — SYNOVIAL FLUID ANALYSIS, COMPLETE
Basophils, %: 0 %
Eosinophils-Synovial: 0 % (ref 0–2)
Lymphocytes-Synovial Fld: 80 % — ABNORMAL HIGH (ref 0–74)
Monocyte/Macrophage: 10 % (ref 0–69)
Neutrophil, Synovial: 10 % (ref 0–24)
Synoviocytes, %: 0 % (ref 0–15)
WBC, Synovial: 125 cells/uL (ref <150)

## 2020-07-14 LAB — ANAEROBIC AND AEROBIC CULTURE
AER RESULT:: NO GROWTH
MICRO NUMBER:: 11849302
MICRO NUMBER:: 11849303
SPECIMEN QUALITY:: ADEQUATE
SPECIMEN QUALITY:: ADEQUATE

## 2020-07-14 NOTE — Telephone Encounter (Signed)
Pt called stating that she came in and got Dr.Dean to drain her knee and then she turned around and had to her leg drained again. She would really like for someone to call her back.  CB [phone]

## 2020-07-14 NOTE — Telephone Encounter (Signed)
See below. You were to call and discuss. Please advise.

## 2020-07-14 NOTE — Telephone Encounter (Signed)
Patient returned Dr. Randel Pigg call and asked for a call back. Patient said the fluid is back and she can not put weight down on it. The number to contact patient is [phone]

## 2020-07-14 NOTE — Telephone Encounter (Signed)
Patient called regarding the last message patient she stated she went to the hospital last night and she got her knee drained and the doctor took off 17 cc she also stated her knee is swollen and in a lot of pain she is unable to walk ,patient stated a couple minutes before calling she fell in the parking lot. Patient stated something is wrong and there is a posibbilty she will be going to [facility] hospital againcall back:[phone]

## 2020-07-14 NOTE — Telephone Encounter (Signed)
See below. IC patient. She did get your message. She wanted to make sure that you were aware that she was back to ER this afternoon to get knee aspirated. She stated her knee is swollen and she cannot put weight on her leg.

## 2020-07-14 NOTE — Telephone Encounter (Signed)
Spoke with Dr Marlou Sa. He will call and discuss with patient

## 2020-07-14 NOTE — Telephone Encounter (Signed)
See below. Please advise.

## 2020-07-15 DIAGNOSIS — G894 Chronic pain syndrome: Secondary | ICD-10-CM | POA: Diagnosis not present

## 2020-07-15 DIAGNOSIS — M545 Low back pain, unspecified: Secondary | ICD-10-CM | POA: Diagnosis not present

## 2020-07-15 DIAGNOSIS — G8929 Other chronic pain: Secondary | ICD-10-CM | POA: Diagnosis not present

## 2020-07-15 DIAGNOSIS — M961 Postlaminectomy syndrome, not elsewhere classified: Secondary | ICD-10-CM | POA: Diagnosis not present

## 2020-07-15 DIAGNOSIS — M25561 Pain in right knee: Secondary | ICD-10-CM | POA: Diagnosis not present

## 2020-07-15 DIAGNOSIS — Z79891 Long term (current) use of opiate analgesic: Secondary | ICD-10-CM | POA: Diagnosis not present

## 2020-07-15 NOTE — Telephone Encounter (Signed)
I called her.  She is going to nonweightbearing quad strengthening exercises.  No infection in the fluid.  Encouraged her to get the quad strong as possible.  No intervention indicated at this time.  Does not sound like it is referred pain from the back or hip.

## 2020-07-15 NOTE — Telephone Encounter (Signed)
I called.  See above

## 2020-07-16 DIAGNOSIS — M1710 Unilateral primary osteoarthritis, unspecified knee: Secondary | ICD-10-CM | POA: Diagnosis not present

## 2020-07-16 DIAGNOSIS — K625 Hemorrhage of anus and rectum: Secondary | ICD-10-CM | POA: Diagnosis not present

## 2020-07-16 DIAGNOSIS — K529 Noninfective gastroenteritis and colitis, unspecified: Secondary | ICD-10-CM | POA: Diagnosis not present

## 2020-07-16 DIAGNOSIS — Z79899 Other long term (current) drug therapy: Secondary | ICD-10-CM | POA: Diagnosis not present

## 2020-07-16 DIAGNOSIS — K6389 Other specified diseases of intestine: Secondary | ICD-10-CM | POA: Diagnosis not present

## 2020-07-16 DIAGNOSIS — D62 Acute posthemorrhagic anemia: Secondary | ICD-10-CM | POA: Diagnosis not present

## 2020-07-16 DIAGNOSIS — Z888 Allergy status to other drugs, medicaments and biological substances status: Secondary | ICD-10-CM | POA: Diagnosis not present

## 2020-07-16 DIAGNOSIS — M5136 Other intervertebral disc degeneration, lumbar region: Secondary | ICD-10-CM | POA: Diagnosis not present

## 2020-07-16 DIAGNOSIS — Z87440 Personal history of urinary (tract) infections: Secondary | ICD-10-CM | POA: Diagnosis not present

## 2020-07-16 DIAGNOSIS — Z792 Long term (current) use of antibiotics: Secondary | ICD-10-CM | POA: Diagnosis not present

## 2020-07-16 DIAGNOSIS — E7800 Pure hypercholesterolemia, unspecified: Secondary | ICD-10-CM | POA: Diagnosis not present

## 2020-07-16 DIAGNOSIS — K219 Gastro-esophageal reflux disease without esophagitis: Secondary | ICD-10-CM | POA: Diagnosis not present

## 2020-07-16 DIAGNOSIS — K6289 Other specified diseases of anus and rectum: Secondary | ICD-10-CM | POA: Diagnosis not present

## 2020-07-16 DIAGNOSIS — Z800 Family history of malignant neoplasm of digestive organs: Secondary | ICD-10-CM | POA: Diagnosis not present

## 2020-07-16 DIAGNOSIS — M549 Dorsalgia, unspecified: Secondary | ICD-10-CM | POA: Diagnosis not present

## 2020-07-16 DIAGNOSIS — G8929 Other chronic pain: Secondary | ICD-10-CM | POA: Diagnosis not present

## 2020-07-16 DIAGNOSIS — F32A Depression, unspecified: Secondary | ICD-10-CM | POA: Diagnosis not present

## 2020-07-16 DIAGNOSIS — Z885 Allergy status to narcotic agent status: Secondary | ICD-10-CM | POA: Diagnosis not present

## 2020-07-16 DIAGNOSIS — I700 Atherosclerosis of aorta: Secondary | ICD-10-CM | POA: Diagnosis not present

## 2020-07-16 DIAGNOSIS — A09 Infectious gastroenteritis and colitis, unspecified: Secondary | ICD-10-CM | POA: Diagnosis not present

## 2020-07-21 ENCOUNTER — Emergency Department (HOSPITAL_COMMUNITY)
Admission: EM | Admit: 2020-07-21 | Discharge: 2020-07-22 | Disposition: A | Discharge status: Home / Self Care | Payer: Medicare Other | Attending: Emergency Medicine | Admitting: Emergency Medicine

## 2020-07-21 ENCOUNTER — Encounter (HOSPITAL_COMMUNITY): Payer: Self-pay | Admitting: Emergency Medicine

## 2020-07-21 ENCOUNTER — Other Ambulatory Visit: Payer: Self-pay

## 2020-07-21 DIAGNOSIS — R1084 Generalized abdominal pain: Secondary | ICD-10-CM | POA: Diagnosis not present

## 2020-07-21 DIAGNOSIS — K5229 Other allergic and dietetic gastroenteritis and colitis: Secondary | ICD-10-CM | POA: Diagnosis not present

## 2020-07-21 DIAGNOSIS — K922 Gastrointestinal hemorrhage, unspecified: Secondary | ICD-10-CM | POA: Diagnosis not present

## 2020-07-21 DIAGNOSIS — K5730 Diverticulosis of large intestine without perforation or abscess without bleeding: Secondary | ICD-10-CM | POA: Diagnosis not present

## 2020-07-21 DIAGNOSIS — J9811 Atelectasis: Secondary | ICD-10-CM | POA: Diagnosis not present

## 2020-07-21 DIAGNOSIS — K51219 Ulcerative (chronic) proctitis with unspecified complications: Secondary | ICD-10-CM | POA: Diagnosis not present

## 2020-07-21 DIAGNOSIS — K529 Noninfective gastroenteritis and colitis, unspecified: Secondary | ICD-10-CM

## 2020-07-21 DIAGNOSIS — R112 Nausea with vomiting, unspecified: Secondary | ICD-10-CM | POA: Diagnosis not present

## 2020-07-21 DIAGNOSIS — R9431 Abnormal electrocardiogram [ECG] [EKG]: Secondary | ICD-10-CM | POA: Diagnosis not present

## 2020-07-21 DIAGNOSIS — K625 Hemorrhage of anus and rectum: Secondary | ICD-10-CM | POA: Diagnosis present

## 2020-07-21 DIAGNOSIS — K921 Melena: Secondary | ICD-10-CM | POA: Diagnosis not present

## 2020-07-21 DIAGNOSIS — R140 Abdominal distension (gaseous): Secondary | ICD-10-CM | POA: Diagnosis not present

## 2020-07-21 LAB — CBC
HCT: 42.3 % (ref 36.0–46.0)
Hemoglobin: 13.2 g/dL (ref 12.0–15.0)
MCH: 26.2 pg (ref 26.0–34.0)
MCHC: 31.2 g/dL (ref 30.0–36.0)
MCV: 83.9 fL (ref 80.0–100.0)
Platelets: 446 10*3/uL — ABNORMAL HIGH (ref 150–400)
RBC: 5.04 MIL/uL (ref 3.87–5.11)
RDW: 14.6 % (ref 11.5–15.5)
WBC: 10.8 10*3/uL — ABNORMAL HIGH (ref 4.0–10.5)
nRBC: 0 % (ref 0.0–0.2)

## 2020-07-21 LAB — I-STAT BETA HCG BLOOD, ED (MC, WL, AP ONLY): I-stat hCG, quantitative: <5 m[IU]/mL (ref <5)

## 2020-07-21 LAB — COMPREHENSIVE METABOLIC PANEL
ALT: 18 U/L (ref 0–44)
AST: 23 U/L (ref 15–41)
Albumin: 4 g/dL (ref 3.5–5.0)
Alkaline Phosphatase: 57 U/L (ref 38–126)
Anion gap: 12 (ref 5–15)
BUN: 9 mg/dL (ref 6–20)
CO2: 25 mmol/L (ref 22–32)
Calcium: 10.2 mg/dL (ref 8.9–10.3)
Chloride: 102 mmol/L (ref 98–111)
Creatinine, Ser: 0.85 mg/dL (ref 0.44–1.00)
GFR, Estimated: >60 mL/min (ref >60)
Glucose, Bld: 95 mg/dL (ref 70–99)
Potassium: 3.9 mmol/L (ref 3.5–5.1)
Sodium: 139 mmol/L (ref 135–145)
Total Bilirubin: 0.6 mg/dL (ref 0.3–1.2)
Total Protein: 7.4 g/dL (ref 6.5–8.1)

## 2020-07-21 LAB — TYPE AND SCREEN
ABO/RH(D): A POS
Antibody Screen: NEGATIVE

## 2020-07-21 NOTE — ED Provider Notes (Signed)
Emergency Medicine Provider Triage Evaluation Note  Brittney Carter , a 60 y.o. female  was evaluated in triage.  Pt complaints of rectal bleeding and abdominal pain.  Patient was recently admitted for rectal bleeding.  She reports continued significant bleeding and pain.  No fevers or chills.  No vomiting.  Review of Systems  Positive: Rectal bleeding, abd pain Negative: fever  Physical Exam  BP (!) 140/99 (BP Location: Right Arm)   Pulse 93   Temp 97.7 F (36.5 C)   Resp 20   Ht 5' 1"  (1.549 m)   Wt 81.2 kg   SpO2 99%   BMI 33.82 kg/m  Gen:   Awake, no distress   Resp:  Normal effort  MSK:   Moves extremities without difficulty Other:  Diffuse tenderness to palpation of the abdomen.  Medical Decision Making  Medically screening exam initiated at 3:18 PM.  Appropriate orders placed.  [name] was informed that the remainder of the evaluation will be completed by another provider, this initial triage assessment does not replace that evaluation, and the importance of remaining in the ED until their evaluation is complete.  Labs ordered   Franchot Heidelberg, PA-C 07/21/20 1521    Sherwood Gambler, MD 07/23/20 1510

## 2020-07-21 NOTE — ED Triage Notes (Signed)
Pt reports from [facility] post admission for GI bleed. She was discharged last Thursday and today saw a GI specialist who did a DRE, bleeding ensued. She is in significant amount of pain abdomen and lower back. She was told to come to the ER for possible emergent colonoscopy.

## 2020-07-22 ENCOUNTER — Emergency Department (HOSPITAL_COMMUNITY): Payer: Medicare Other

## 2020-07-22 DIAGNOSIS — J9811 Atelectasis: Secondary | ICD-10-CM | POA: Diagnosis not present

## 2020-07-22 DIAGNOSIS — R140 Abdominal distension (gaseous): Secondary | ICD-10-CM | POA: Diagnosis not present

## 2020-07-22 DIAGNOSIS — K5730 Diverticulosis of large intestine without perforation or abscess without bleeding: Secondary | ICD-10-CM | POA: Diagnosis not present

## 2020-07-22 DIAGNOSIS — K922 Gastrointestinal hemorrhage, unspecified: Secondary | ICD-10-CM | POA: Diagnosis not present

## 2020-07-22 LAB — POC OCCULT BLOOD, ED: Fecal Occult Bld: NEGATIVE

## 2020-07-22 LAB — URINALYSIS, ROUTINE W REFLEX MICROSCOPIC
Bacteria, UA: NONE SEEN
Bilirubin Urine: NEGATIVE
Glucose, UA: NEGATIVE mg/dL
Hgb urine dipstick: NEGATIVE
Ketones, ur: NEGATIVE mg/dL
Nitrite: NEGATIVE
Protein, ur: NEGATIVE mg/dL
Specific Gravity, Urine: 1.02 (ref 1.005–1.030)
pH: 6 (ref 5.0–8.0)

## 2020-07-22 LAB — LACTIC ACID, PLASMA: Lactic Acid, Venous: 1.4 mmol/L (ref 0.5–1.9)

## 2020-07-22 MED ORDER — ONDANSETRON HCL 4 MG/2ML IJ SOLN
4.0000 mg | Freq: Once | INTRAMUSCULAR | Status: AC
  Administered 2020-07-22: 4 mg via INTRAVENOUS
  Filled 2020-07-22: qty 2

## 2020-07-22 MED ORDER — SODIUM CHLORIDE 0.9 % IV BOLUS
1000.0000 mL | Freq: Once | INTRAVENOUS | Status: AC
  Administered 2020-07-22: 1000 mL via INTRAVENOUS

## 2020-07-22 MED ORDER — FENTANYL CITRATE (PF) 100 MCG/2ML IJ SOLN
75.0000 ug | Freq: Once | INTRAMUSCULAR | Status: AC
  Administered 2020-07-22: 75 ug via INTRAVENOUS
  Filled 2020-07-22: qty 2

## 2020-07-22 MED ORDER — IOHEXOL 350 MG/ML SOLN
75.0000 mL | Freq: Once | INTRAVENOUS | Status: AC | PRN
  Administered 2020-07-22: 75 mL via INTRAVENOUS

## 2020-07-22 MED ORDER — OXYCODONE-ACETAMINOPHEN 5-325 MG PO TABS
2.0000 | ORAL_TABLET | Freq: Once | ORAL | Status: AC
  Administered 2020-07-22: 2 via ORAL
  Filled 2020-07-22: qty 2

## 2020-07-22 NOTE — Discharge Instructions (Addendum)
Thank you for allowing me to care for you today in the Emergency Department.   Good news!  As we discussed, there is still some inflammation of your colon, but it has improved from your hospitalization last week.  It is important to continue to take your prescription of Levaquin and metronidazole as directed.  You can start taking the pain medication that [name] called in for you to see if this will help with your abdominal pain.  If you have any other concerns about managing her pain at home, you need to follow-up with your pain clinic because other prescriptions of narcotic pain medication could void your contract.  Make sure that you continue to eat and drink to avoid dehydration.  You still need to have a repeat colonoscopy in the next few weeks.  You can follow-up with [name] or call to get established with a new gastroenterologist using the contact information above.  Return to the emergency department if you have recurrent rectal bleeding or passing large clots of blood, if you pass out, if you have uncontrollable abdominal pain, if you stop producing urine, or have other new, concerning symptoms.

## 2020-07-22 NOTE — ED Provider Notes (Signed)
[facility] EMERGENCY DEPARTMENT Provider Note   CSN: [id number] Arrival date & time: 07/21/20  1459     History Chief Complaint  Patient presents with  . Rectal Bleeding  . Abdominal Pain  . Back Pain    Brittney Carter is a 60 y.o. female with a history of chronic back pain on chronic opioids, depression, who presents to the emergency department with a chief complaint of abdominal pain.  The patient was seen by her gastroenterologist, [name], this afternoon after a follow-up from a recent admission for GI bleeding at [facility] that was thought to be secondary to proctocolitis.  The patient reports that she has a history of intermittent rectal bleeding that was thought to be due to bleeding internal hemorrhoids, but she does have a family history of colorectal cancer.  Her last colonoscopy was in 2018 with [name] and did have internal hemorrhoids and bleeding was thought to be from hemorrhoids versus cryptitis. She also had polyps that were removed.  Repeat colonoscopy was scheduled to be in 5 years.   She presented to [facility] on 5/11 after bright red rectal bleeding significantly worsened and she was passing large clots.  She had a CT scan that was consistent with proctocolitis and she was admitted for IV antibiotics and was treated with Zosyn, IV Protonix, and pain medication.  Her hemoglobin remained stable and she had a bowel movement during the hospital course with no melena or hematochezia.  She was discharged with levofloxacin, Flagyl, and pain medication on 11/14.  Since that time, she denies any other melena or hematochezia.  She continues to have rectal pain as well as generalized cramping abdominal pain, bilateral low back pain, diarrhea, fatigue, and severe nausea.  Symptoms have not been well controlled with pain medication at home.  She has been feeling very weak and fatigued.  She denies vomiting, fever, shortness of breath,  dizziness, lightheadedness, chest pain, leg swelling, syncope, rash, dysuria, or hematuria.  She has not been eating and drinking at her baseline as she is concerned due to the cramping and that if she keeps cramping that the bleeding will return.  She has only voided once in the last 9 hours.  During her visit today, [name] discussed setting the patient up with an urgent colonoscopy in the next week or week and a half or coming to the hospital to see if she could have a more urgent colonoscopy as there was concern for ischemic colitis, bleeding internal hemorrhoids, or inflammatory bowel disease.  The history is provided by the patient and medical records. No language interpreter was used.       Past Medical History:  Diagnosis Date  . Arthritis    Back  . Depression    no medication  . Dyspnea    with exertion     Patient Active Problem List   Diagnosis Date Noted  . Back pain of thoracolumbar region 10/17/2017  . Long-term current use of opiate analgesic 03/28/2017  . Chronic pain 11/24/2016  . Cervical radiculopathy at C7 12/03/2015  . Sacroiliac joint pain 12/03/2015  . Chronic low back pain 11/25/2014  . Segmental and somatic dysfunction of pelvic region 10/28/2014  . Disc degeneration, lumbar 08/12/2014  . Lumbar radiculopathy 08/12/2014  . Lumbar stenosis 08/12/2014  . Osteoarthritis of spine with radiculopathy, lumbar region 08/12/2014    Past Surgical History:  Procedure Laterality Date  . BACK SURGERY  2016   Laminectomy  .  BACK SURGERY  1991   Disectomy  . BREAST SURGERY     Breast lift  . COLONOSCOPY W/ POLYPECTOMY     x 2  . Pain stimulator trail    . SPINAL CORD STIMULATOR INSERTION N/A 11/24/2016   Procedure: LUMBAR SPINAL CORD STIMULATOR INSERTION;  Surgeon: Melina Schools, MD;  Location: [facility];  Service: Orthopedics;  Laterality: N/A;  2.5 hrs  . SPINAL CORD STIMULATOR REMOVAL N/A 06/28/2017   Procedure: Spinal cord stimulator battery removal;   Surgeon: Melina Schools, MD;  Location: [facility];  Service: Orthopedics;  Laterality: N/A;  . TONSILLECTOMY     age 63     OB History   No obstetric history on file.     No family history on file.  Social History   Tobacco Use  . Smoking status: Never Smoker  . Smokeless tobacco: Never Used  Vaping Use  . Vaping Use: Never used  Substance Use Topics  . Alcohol use: No  . Drug use: No    Home Medications Prior to Admission medications   Medication Sig Start Date End Date Taking? Authorizing Provider  Artificial Tear Solution (SYSTANE CONTACTS OP) Place 1 drop into both eyes daily as needed (dry eyes).   Yes [provider]  escitalopram (LEXAPRO) 10 MG tablet Take 15 mg by mouth daily. 06/30/20  Yes [provider]  fluticasone (FLONASE) 50 MCG/ACT nasal spray Place 2 sprays into both nostrils daily as needed for allergies or rhinitis.   Yes [provider]  levofloxacin (LEVAQUIN) 500 MG tablet Take 500 mg by mouth See admin instructions. Qd x 10 days 07/18/20  Yes [provider]  metroNIDAZOLE (FLAGYL) 500 MG tablet Take 500 mg by mouth See admin instructions. Tid x 10 days 07/18/20  Yes [provider]  Multiple Vitamins-Minerals (CENTRUM SILVER 50+WOMEN) TABS Take 1 tablet by mouth daily.   Yes [provider]  naloxone (NARCAN) nasal spray 4 mg/0.1 mL Place 4 mg into the nose as needed (overdose). 04/04/19  Yes [provider]  oxyCODONE (OXY IR/ROXICODONE) 5 MG immediate release tablet Take 5 mg by mouth every 6 (six) hours as needed for moderate pain. 07/18/20  Yes [provider]  rosuvastatin (CRESTOR) 20 MG tablet Take 20 mg by mouth daily. 04/28/20  Yes [provider]  traZODone (DESYREL) 50 MG tablet Take 50-100 mg by mouth at bedtime. 07/03/20  Yes [provider]    Allergies    Morphine, Tramadol, and Gabapentin  Review of Systems   Review of Systems  Constitutional: Positive  for chills and fatigue. Negative for activity change and fever.  HENT: Negative for congestion and sore throat.   Respiratory: Negative for cough, choking, shortness of breath and wheezing.   Cardiovascular: Negative for chest pain and palpitations.  Gastrointestinal: Positive for abdominal pain, blood in stool (resolved), diarrhea, hematochezia and nausea. Negative for constipation and vomiting.  Genitourinary: Negative for dysuria, flank pain, frequency, urgency, vaginal bleeding, vaginal discharge and vaginal pain.  Musculoskeletal: Positive for back pain. Negative for arthralgias, myalgias, neck pain and neck stiffness.  Skin: Negative for rash and wound.  Allergic/Immunologic: Negative for immunocompromised state.  Neurological: Negative for dizziness, seizures, syncope, weakness, numbness and headaches.  Psychiatric/Behavioral: Negative for confusion.    Physical Exam Updated Vital Signs BP 107/71   Pulse 72   Temp 97.8 F (36.6 C) (Oral)   Resp 16   Ht 5' 1"  (1.549 m)   Wt 81.2 kg  SpO2 98%   BMI 33.82 kg/m   Physical Exam Vitals and nursing note reviewed.  Constitutional:      General: She is not in acute distress.    Appearance: She is not toxic-appearing or diaphoretic.  HENT:     Head: Normocephalic.     Mouth/Throat:     Pharynx: No oropharyngeal exudate or posterior oropharyngeal erythema.  Eyes:     Conjunctiva/sclera: Conjunctivae normal.  Cardiovascular:     Rate and Rhythm: Normal rate and regular rhythm.     Heart sounds: No murmur heard. No friction rub. No gallop.   Pulmonary:     Effort: Pulmonary effort is normal. No respiratory distress.  Abdominal:     General: There is no distension.     Palpations: Abdomen is soft. There is no mass.     Tenderness: There is abdominal tenderness. There is no right CVA tenderness, left CVA tenderness, guarding or rebound.     Hernia: No hernia is present.     Comments: Generalized tenderness to palpation  throughout the abdomen.  Abdomen is soft and nondistended.  No tenderness over McBurney's point.  Negative Murphy sign.  No CVA tenderness bilaterally.  No rebound or guarding.  Musculoskeletal:     Cervical back: Neck supple.  Skin:    General: Skin is warm.     Capillary Refill: Capillary refill takes 2 to 3 seconds.     Coloration: Skin is not jaundiced.     Findings: No bruising, erythema or rash.  Neurological:     Mental Status: She is alert.  Psychiatric:        Behavior: Behavior normal.     ED Results / Procedures / Treatments   Labs (all labs ordered are listed, but only abnormal results are displayed) Labs Reviewed  CBC - Abnormal; Notable for the following components:      Result Value   WBC 10.8 (*)    Platelets 446 (*)    All other components within normal limits  URINALYSIS, ROUTINE W REFLEX MICROSCOPIC - Abnormal; Notable for the following components:   Leukocytes,Ua TRACE (*)    All other components within normal limits  COMPREHENSIVE METABOLIC PANEL  LACTIC ACID, PLASMA  POC OCCULT BLOOD, ED  I-STAT BETA HCG BLOOD, ED ([facility], [facility], [facility] ONLY)  TYPE AND SCREEN  ABO/RH    EKG None  Radiology CT Angio Abd/Pel W and/or Wo Contrast  Result Date: 07/22/2020 CLINICAL DATA:  GI bleed Mesenteric ischemia, acute EXAM: CTA ABDOMEN AND PELVIS WITHOUT AND WITH CONTRAST TECHNIQUE: Multidetector CT imaging of the abdomen and pelvis was performed using the standard protocol during bolus administration of intravenous contrast. Multiplanar reconstructed images and MIPs were obtained and reviewed to evaluate the vascular anatomy. CONTRAST:  20m OMNIPAQUE IOHEXOL 350 MG/ML SOLN COMPARISON:  Jul 16, 2020 FINDINGS: VASCULAR Aorta: Normal caliber aorta without aneurysm, dissection, vasculitis or significant stenosis. Celiac: Patent without evidence of aneurysm, dissection, vasculitis or significant stenosis. SMA: Patent without evidence of aneurysm, dissection, vasculitis or significant  stenosis. Renals: Both renal arteries are patent without evidence of aneurysm, dissection, vasculitis, fibromuscular dysplasia or significant stenosis. IMA: Patent without evidence of aneurysm, dissection, vasculitis or significant stenosis. Inflow: Patent without evidence of aneurysm, dissection, vasculitis or significant stenosis. Proximal Outflow: Bilateral common femoral and visualized portions of the superficial and profunda femoral arteries are patent without evidence of aneurysm, dissection, vasculitis or significant stenosis. Veins: No obvious venous abnormality within the limitations of this arterial phase study. Review of  the MIP images confirms the above findings. NON-VASCULAR Lower chest: Bibasilar atelectasis. Normal size heart. No significant pericardial effusion/thickening. Hepatobiliary: No suspicious hepatic lesion. Gallbladder is predominantly decompressed otherwise unremarkable. No biliary ductal dilation. Pancreas: Unremarkable. No pancreatic ductal dilatation or surrounding inflammatory changes. Spleen: Normal in size without focal abnormality. Adrenals/Urinary Tract: Adrenal glands are unremarkable. Kidneys are normal, without renal calculi, focal lesion, or hydronephrosis. Bladder is unremarkable for degree of distension. Stomach/Bowel: Stomach is within normal limits. Normal positioning of the duodenum/ligament of Treitz. No pathologic dilation of small bowel. The appendix and terminal ileum are grossly unremarkable. There is decreased but persistent wall thickening of the sigmoid colon and rectum. No pneumatosis or signs of perforation. Colonic diverticulosis without findings of acute diverticulitis. Lymphatic: No gastrohepatic or hepatoduodenal ligament lymphadenopathy. No retroperitoneal or mesenteric lymphadenopathy. No pelvic sidewall lymphadenopathy. No groin lymphadenopathy. Reproductive: Uterus and bilateral adnexa are unremarkable. Other: No abdominal wall hernia or abnormality. No  abdominopelvic ascites. Musculoskeletal: L4-L5 discogenic disease. No acute osseous abnormality. IMPRESSION: 1. Improved but persistent wall thickening of the sigmoid colon and rectum, suggestive of infectious or inflammatory colitis/proctitis. No pneumatosis or signs of perforation. 2. Mesenteric vasculature is patent. No evidence of active gastrointestinal bleed. If continued clinical concern for active gastrointestinal bleed consider further evaluation with nuclear medicine tagged red blood cell scan. 3. Colonic diverticulosis without findings of acute diverticulitis. Electronically Signed   By: Dahlia Bailiff MD   On: 07/22/2020 02:35    Procedures Procedures   Medications Ordered in ED Medications  sodium chloride 0.9 % bolus 1,000 mL (0 mLs Intravenous Stopped 07/22/20 0307)  ondansetron (ZOFRAN) injection 4 mg (4 mg Intravenous Given 07/22/20 0054)  fentaNYL (SUBLIMAZE) injection 75 mcg (75 mcg Intravenous Given 07/22/20 0053)  iohexol (OMNIPAQUE) 350 MG/ML injection 75 mL (75 mLs Intravenous Contrast Given 07/22/20 0136)  oxyCODONE-acetaminophen (PERCOCET/ROXICET) 5-325 MG per tablet 2 tablet (2 tablets Oral Given 07/22/20 0522)    ED Course  I have reviewed the triage vital signs and the nursing notes.  Pertinent labs & imaging results that were available during my care of the patient were reviewed by me and considered in my medical decision making (see chart for details).  Clinical Course as of 07/22/20 2130  Wed Jul 22, 2020  0404 Discussed updated plan with patient after speaking with Dr. Ardis Hughs, GI.  UA is pending.  Patient reports that she is out of her home 10-325 Percocets for which she is followed by pain management.  When she was seen for her last appointment she was to pill short and was told that she would not have her pain medication refilled until she was received again in 2 weeks. [MM]    Clinical Course User Index [MM] Raymar Joiner, Laymond Purser, PA-C   MDM Rules/Calculators/A&P                           60 year old female with a history of chronic back pain on chronic opioids, depression who presents the emergency department from her gastroenterologist office with abdominal pain, nausea, diarrhea, fatigue, generalized weakness who recently had hematochezia with concern for ischemic colitis.  Patient recently had an admission at [facility] last week for similar symptoms and was diagnosed with proctocolitis.  She was treated with Zosyn during her admission and was discharged with Levaquin, Flagyl, which she has been compliant with.  Vital signs stable.  She has no peritoneal signs on exam.  Labs and imaging have been reviewed  and independently interpreted by me.  The patient was discussed with Dr. Dayna Barker, attending physician.  Hemoglobin is stable at 13.2.  She has a borderline leukocytosis of 10.8.  No metabolic derangements.  Creatinine is stable.  Lactate is normal.  UA is unremarkable.  CT angio of the abdomen given outpatient GI's concern for ischemic colitis was obtained after discussing with Dr. Dayna Barker.  No evidence of ischemia or GI bleed.  There is continued proctocolitis, but there is interval improvement from previous imaging.  No other acute findings.  I have a low suspicion for pyelonephritis, mesenteric ischemia, bowel obstruction, ovarian torsion, PID, cholecystitis, aortic dissection, pancreatitis, ruptured peptic ulcer disease, diverticular bleed.  I spoke with Dr. Ardis Hughs, [facility], regarding the patient's exam, history, and work-up thus far.  Patient is stable and does not require admission for an emergent colonoscopy.  No other emergent GI recommendations at this time.  GI consult appreciated.  On reevaluation, patient reports improvement in pain.  She has been given a dose of her home pain medication.  States that she is out of this to her pain management team due to being 2 pills short at her last visit and cannot follow back up until May 28.  I  discussed with the patient that since she is currently under pain management that I cannot write her a prescription for narcotic medication as this could violate her contract with her pain management specialist.  She acknowledges this.  Encouraged her to start the Bentyl prescription that [name] wrote for her.  She should also follow-up with her pain management team if her pain is not well controlled with this regimen.  She should continue her home antibiotics.   At this time, the patient is hemodynamically stable and in no acute distress.  Safe for discharge to home with outpatient follow-up as indicated.  Final Clinical Impression(s) / ED Diagnoses Final diagnoses:  Proctocolitis    Rx / DC Orders ED Discharge Orders    None       Joanne Gavel, PA-C 07/22/20 [id number]    Merrily Pew, MD 07/24/20 1450

## 2020-07-27 ENCOUNTER — Other Ambulatory Visit: Payer: Self-pay | Admitting: *Deleted

## 2020-07-27 NOTE — Patient Outreach (Signed)
[facility]) Care Management  07/27/2020  Brittney Carter [birth year] [id number]   Transition Of Care-Unsuccessful  Outreach #1  RN attempted outreach call however unsuccessful. RN able to leave a HIPAA approved voice message requesting a call back.  Will follow up once again next week.  [name], RN Care Management Coordinator [place] Office [phone]

## 2020-07-29 ENCOUNTER — Ambulatory Visit: Payer: Medicare Other | Admitting: Orthopedic Surgery

## 2020-07-31 ENCOUNTER — Ambulatory Visit: Payer: Medicare Other | Admitting: Orthopedic Surgery

## 2020-08-04 ENCOUNTER — Emergency Department (HOSPITAL_COMMUNITY): Payer: Medicare Other

## 2020-08-04 ENCOUNTER — Emergency Department (HOSPITAL_BASED_OUTPATIENT_CLINIC_OR_DEPARTMENT_OTHER)
Admit: 2020-08-04 | Discharge: 2020-08-04 | Disposition: A | Discharge status: Home / Self Care | Payer: Medicare Other | Attending: Emergency Medicine | Admitting: Emergency Medicine

## 2020-08-04 ENCOUNTER — Telehealth: Payer: Self-pay | Admitting: Orthopedic Surgery

## 2020-08-04 ENCOUNTER — Encounter (HOSPITAL_COMMUNITY): Payer: Self-pay | Admitting: *Deleted

## 2020-08-04 ENCOUNTER — Inpatient Hospital Stay (HOSPITAL_COMMUNITY)
Admission: EM | Admit: 2020-08-04 | Discharge: 2020-08-09 | DRG: 176 | Disposition: A | Discharge status: Home Health | Payer: Medicare Other | Attending: Internal Medicine | Admitting: Internal Medicine

## 2020-08-04 ENCOUNTER — Other Ambulatory Visit: Payer: Self-pay | Admitting: *Deleted

## 2020-08-04 DIAGNOSIS — Z79899 Other long term (current) drug therapy: Secondary | ICD-10-CM | POA: Diagnosis not present

## 2020-08-04 DIAGNOSIS — I824Z1 Acute embolism and thrombosis of unspecified deep veins of right distal lower extremity: Secondary | ICD-10-CM

## 2020-08-04 DIAGNOSIS — F32A Depression, unspecified: Secondary | ICD-10-CM | POA: Diagnosis not present

## 2020-08-04 DIAGNOSIS — M7989 Other specified soft tissue disorders: Secondary | ICD-10-CM | POA: Diagnosis not present

## 2020-08-04 DIAGNOSIS — I82492 Acute embolism and thrombosis of other specified deep vein of left lower extremity: Secondary | ICD-10-CM | POA: Diagnosis not present

## 2020-08-04 DIAGNOSIS — Z8616 Personal history of COVID-19: Secondary | ICD-10-CM

## 2020-08-04 DIAGNOSIS — I82451 Acute embolism and thrombosis of right peroneal vein: Secondary | ICD-10-CM | POA: Diagnosis not present

## 2020-08-04 DIAGNOSIS — R519 Headache, unspecified: Secondary | ICD-10-CM | POA: Diagnosis present

## 2020-08-04 DIAGNOSIS — I2699 Other pulmonary embolism without acute cor pulmonale: Principal | ICD-10-CM | POA: Diagnosis present

## 2020-08-04 DIAGNOSIS — R079 Chest pain, unspecified: Secondary | ICD-10-CM | POA: Diagnosis not present

## 2020-08-04 DIAGNOSIS — R609 Edema, unspecified: Secondary | ICD-10-CM | POA: Diagnosis not present

## 2020-08-04 DIAGNOSIS — D6489 Other specified anemias: Secondary | ICD-10-CM | POA: Diagnosis not present

## 2020-08-04 DIAGNOSIS — G8929 Other chronic pain: Secondary | ICD-10-CM | POA: Diagnosis present

## 2020-08-04 DIAGNOSIS — I82441 Acute embolism and thrombosis of right tibial vein: Secondary | ICD-10-CM | POA: Diagnosis not present

## 2020-08-04 DIAGNOSIS — Z800 Family history of malignant neoplasm of digestive organs: Secondary | ICD-10-CM | POA: Diagnosis not present

## 2020-08-04 DIAGNOSIS — R0602 Shortness of breath: Secondary | ICD-10-CM | POA: Diagnosis not present

## 2020-08-04 DIAGNOSIS — Z743 Need for continuous supervision: Secondary | ICD-10-CM | POA: Diagnosis not present

## 2020-08-04 DIAGNOSIS — I82431 Acute embolism and thrombosis of right popliteal vein: Secondary | ICD-10-CM | POA: Diagnosis not present

## 2020-08-04 DIAGNOSIS — R9431 Abnormal electrocardiogram [ECG] [EKG]: Secondary | ICD-10-CM | POA: Diagnosis not present

## 2020-08-04 DIAGNOSIS — M549 Dorsalgia, unspecified: Secondary | ICD-10-CM | POA: Diagnosis present

## 2020-08-04 DIAGNOSIS — D72829 Elevated white blood cell count, unspecified: Secondary | ICD-10-CM

## 2020-08-04 DIAGNOSIS — M25561 Pain in right knee: Secondary | ICD-10-CM | POA: Diagnosis not present

## 2020-08-04 LAB — CBC WITH DIFFERENTIAL/PLATELET
Abs Immature Granulocytes: 0.03 10*3/uL (ref 0.00–0.07)
Basophils Absolute: 0.1 10*3/uL (ref 0.0–0.1)
Basophils Relative: 1 %
Eosinophils Absolute: 0 10*3/uL (ref 0.0–0.5)
Eosinophils Relative: 1 %
HCT: 37.3 % (ref 36.0–46.0)
Hemoglobin: 11.8 g/dL — ABNORMAL LOW (ref 12.0–15.0)
Immature Granulocytes: 0 %
Lymphocytes Relative: 23 %
Lymphs Abs: 1.8 10*3/uL (ref 0.7–4.0)
MCH: 26.5 pg (ref 26.0–34.0)
MCHC: 31.6 g/dL (ref 30.0–36.0)
MCV: 83.6 fL (ref 80.0–100.0)
Monocytes Absolute: 0.6 10*3/uL (ref 0.1–1.0)
Monocytes Relative: 8 %
Neutro Abs: 5.4 10*3/uL (ref 1.7–7.7)
Neutrophils Relative %: 67 %
Platelets: 361 10*3/uL (ref 150–400)
RBC: 4.46 MIL/uL (ref 3.87–5.11)
RDW: 14.9 % (ref 11.5–15.5)
WBC: 8 10*3/uL (ref 4.0–10.5)
nRBC: 0 % (ref 0.0–0.2)

## 2020-08-04 LAB — BASIC METABOLIC PANEL
Anion gap: 8 (ref 5–15)
BUN: 10 mg/dL (ref 6–20)
CO2: 28 mmol/L (ref 22–32)
Calcium: 9.8 mg/dL (ref 8.9–10.3)
Chloride: 104 mmol/L (ref 98–111)
Creatinine, Ser: 0.84 mg/dL (ref 0.44–1.00)
GFR, Estimated: >60 mL/min (ref >60)
Glucose, Bld: 108 mg/dL — ABNORMAL HIGH (ref 70–99)
Potassium: 3.8 mmol/L (ref 3.5–5.1)
Sodium: 140 mmol/L (ref 135–145)

## 2020-08-04 LAB — RESP PANEL BY RT-PCR (FLU A&B, COVID) ARPGX2
Influenza A by PCR: NEGATIVE
Influenza B by PCR: NEGATIVE
SARS Coronavirus 2 by RT PCR: NEGATIVE

## 2020-08-04 LAB — APTT: aPTT: 32 seconds (ref 24–36)

## 2020-08-04 LAB — PROTIME-INR
INR: 1 (ref 0.8–1.2)
Prothrombin Time: 13.2 seconds (ref 11.4–15.2)

## 2020-08-04 MED ORDER — OXYCODONE HCL 5 MG PO TABS
5.0000 mg | ORAL_TABLET | Freq: Once | ORAL | Status: AC
  Administered 2020-08-04: 5 mg via ORAL
  Filled 2020-08-04: qty 1

## 2020-08-04 MED ORDER — IOHEXOL 350 MG/ML SOLN
100.0000 mL | Freq: Once | INTRAVENOUS | Status: AC | PRN
  Administered 2020-08-04: 100 mL via INTRAVENOUS

## 2020-08-04 MED ORDER — ACETAMINOPHEN 325 MG PO TABS: 650.0000 mg | ORAL_TABLET | Freq: Four times a day (QID) | ORAL | Status: DC | PRN

## 2020-08-04 MED ORDER — HYDROMORPHONE HCL 1 MG/ML IJ SOLN
1.0000 mg | Freq: Once | INTRAMUSCULAR | Status: AC
  Administered 2020-08-04: 1 mg via INTRAVENOUS
  Filled 2020-08-04: qty 1

## 2020-08-04 MED ORDER — ACETAMINOPHEN 650 MG RE SUPP: 650.0000 mg | Freq: Four times a day (QID) | RECTAL | Status: DC | PRN

## 2020-08-04 MED ORDER — HEPARIN (PORCINE) 25000 UT/250ML-% IV SOLN
1200.0000 [IU]/h | INTRAVENOUS | Status: DC
  Administered 2020-08-04: 1300 [IU]/h via INTRAVENOUS
  Administered 2020-08-05: 1200 [IU]/h via INTRAVENOUS
  Filled 2020-08-04 (×2): qty 250

## 2020-08-04 MED ORDER — ESCITALOPRAM OXALATE 10 MG PO TABS
15.0000 mg | ORAL_TABLET | Freq: Every day | ORAL | Status: DC
  Administered 2020-08-05 – 2020-08-09 (×5): 15 mg via ORAL
  Filled 2020-08-04 (×5): qty 2

## 2020-08-04 MED ORDER — TRAZODONE HCL 100 MG PO TABS
50.0000 mg | ORAL_TABLET | Freq: Every day | ORAL | Status: DC
  Administered 2020-08-04: 50 mg via ORAL
  Administered 2020-08-05 – 2020-08-08 (×4): 100 mg via ORAL
  Filled 2020-08-04 (×5): qty 1

## 2020-08-04 MED ORDER — HEPARIN BOLUS VIA INFUSION
4000.0000 [IU] | Freq: Once | INTRAVENOUS | Status: AC
  Administered 2020-08-04: 4000 [IU] via INTRAVENOUS
  Filled 2020-08-04: qty 4000

## 2020-08-04 MED ORDER — OXYCODONE HCL 5 MG PO TABS: 5.0000 mg | ORAL_TABLET | Freq: Four times a day (QID) | ORAL | Status: DC | PRN

## 2020-08-04 NOTE — Telephone Encounter (Signed)
[place] thx for [employer]

## 2020-08-04 NOTE — H&P (Addendum)
History and Physical    Brittney Carter IWP:[id number] DOB: 1960-04-07 DOA: 08/04/2020  PCP: [name], MD  Patient coming from: Home.  Chief Complaint: Right knee pain.  HPI: Brittney Carter is a 60 y.o. female with history of depression who has been having pain in the right knee since recent surgery has been having arthrocentesis by patient's orthopedist and last 1 was about a month ago has been having increasing pain in the right knee and in addition patient also complained of right calf pain.  Over the last 2 weeks the pain has been worsening along with patient having mild shortness of breath.  Denies any fever chills.  Patient states 3 weeks ago she was admitted at [facility] for rectal bleeding and was told that she had colitis and has outpatient follow-up with Dr. [name] of gastroenterology.  ED Course: In the ER x-rays revealed mild effusion in the right knee.  Dopplers done shows DVT of the right lower extremity and CT angiogram shows pulm embolism.  No strain pattern.  Given that patient had recent rectal bleeding admitted for further observation in the setting of patient being started on anticoagulation.  COVID test was negative.  Review of Systems: As per HPI, rest all negative.   Past Medical History:  Diagnosis Date  . Arthritis    Back  . Depression    no medication  . Dyspnea    with exertion     Past Surgical History:  Procedure Laterality Date  . BACK SURGERY  2016   Laminectomy  . BACK SURGERY  1991   Disectomy  . BREAST SURGERY     Breast lift  . COLONOSCOPY W/ POLYPECTOMY     x 2  . Pain stimulator trail    . SPINAL CORD STIMULATOR INSERTION N/A 11/24/2016   Procedure: LUMBAR SPINAL CORD STIMULATOR INSERTION;  Surgeon: Melina Schools, MD;  Location: [place];  Service: Orthopedics;  Laterality: N/A;  2.5 hrs  . SPINAL CORD STIMULATOR REMOVAL N/A 06/28/2017   Procedure: Spinal cord stimulator battery removal;  Surgeon: Melina Schools, MD;   Location: [place];  Service: Orthopedics;  Laterality: N/A;  . TONSILLECTOMY     age 9     reports that she has never smoked. She has never used smokeless tobacco. She reports that she does not drink alcohol and does not use drugs.  Allergies  Allergen Reactions  . Morphine Anaphylaxis, Hives, Shortness Of Breath and Rash  . Tramadol Anaphylaxis, Swelling and Rash    headache  . Gabapentin Rash and Swelling    History reviewed. No pertinent family history.  Prior to Admission medications   Medication Sig Start Date End Date Taking? Authorizing Provider  Artificial Tear Solution (SYSTANE CONTACTS OP) Place 1 drop into both eyes daily as needed (dry eyes).    [provider]  escitalopram (LEXAPRO) 10 MG tablet Take 15 mg by mouth daily. 06/30/20   [provider]  fluticasone (FLONASE) 50 MCG/ACT nasal spray Place 2 sprays into both nostrils daily as needed for allergies or rhinitis.    [provider]  levofloxacin (LEVAQUIN) 500 MG tablet Take 500 mg by mouth See admin instructions. Qd x 10 days 07/18/20   [provider]  metroNIDAZOLE (FLAGYL) 500 MG tablet Take 500 mg by mouth See admin instructions. Tid x 10 days 07/18/20   [provider]  Multiple Vitamins-Minerals (CENTRUM SILVER 50+WOMEN) TABS Take 1 tablet by mouth daily.    [provider]  naloxone (NARCAN) nasal spray 4 mg/0.1 mL Place 4 mg into the nose as needed (overdose). 04/04/19   [provider]  oxyCODONE (OXY IR/ROXICODONE) 5 MG immediate release tablet Take 5 mg by mouth every 6 (six) hours as needed for moderate pain. 07/18/20   [provider]  rosuvastatin (CRESTOR) 20 MG tablet Take 20 mg by mouth daily. 04/28/20   [provider]  traZODone (DESYREL) 50 MG tablet Take 50-100 mg by mouth at bedtime. 07/03/20   [provider]    Physical Exam: Constitutional: Moderately built and nourished. Vitals:   08/04/20 1900 08/04/20  2010 08/04/20 2030 08/04/20 2100  BP: (!) 137/91 (!) 141/80 119/80 104/77  Pulse: 71 79 67 67  Resp: 16 18  18   Temp:      TempSrc:      SpO2: 97% 98% 91% 93%   Eyes: Anicteric no pallor. ENMT: No discharge from the ears eyes nose or mouth. Neck: No mass felt.  No neck rigidity. Respiratory: No rhonchi or crepitations. Cardiovascular: S1-S2 heard. Abdomen: Soft nontender bowel sounds present. Musculoskeletal: Mild edema of the right knee. Skin: No rash. Neurologic: Alert awake oriented to time place and person.  Moves all extremities. Psychiatric: Appears normal.  Normal affect.   Labs on Admission: I have personally reviewed following labs and imaging studies  CBC: Recent Labs  Lab 08/04/20 1856  WBC 8.0  NEUTROABS 5.4  HGB 11.8*  HCT 37.3  MCV 83.6  PLT 016   Basic Metabolic Panel: Recent Labs  Lab 08/04/20 1856  NA 140  K 3.8  CL 104  CO2 28  GLUCOSE 108*  BUN 10  CREATININE 0.84  CALCIUM 9.8   GFR: CrCl cannot be calculated (Unknown ideal weight.). Liver Function Tests: No results for input(s): AST, ALT, ALKPHOS, BILITOT, PROT, ALBUMIN in the last 168 hours. No results for input(s): LIPASE, AMYLASE in the last 168 hours. No results for input(s): AMMONIA in the last 168 hours. Coagulation Profile: No results for input(s): INR, PROTIME in the last 168 hours. Cardiac Enzymes: No results for input(s): CKTOTAL, CKMB, CKMBINDEX, TROPONINI in the last 168 hours. BNP (last 3 results) No results for input(s): PROBNP in the last 8760 hours. HbA1C: No results for input(s): HGBA1C in the last 72 hours. CBG: No results for input(s): GLUCAP in the last 168 hours. Lipid Profile: No results for input(s): CHOL, HDL, LDLCALC, TRIG, CHOLHDL, LDLDIRECT in the last 72 hours. Thyroid Function Tests: No results for input(s): TSH, T4TOTAL, FREET4, T3FREE, THYROIDAB in the last 72 hours. Anemia Panel: No results for input(s): VITAMINB12, FOLATE, FERRITIN, TIBC, IRON,  RETICCTPCT in the last 72 hours. Urine analysis:    Component Value Date/Time   COLORURINE YELLOW 07/22/2020 0401   APPEARANCEUR CLEAR 07/22/2020 0401   LABSPEC 1.020 07/22/2020 0401   PHURINE 6.0 07/22/2020 0401   GLUCOSEU NEGATIVE 07/22/2020 0401   HGBUR NEGATIVE 07/22/2020 0401   BILIRUBINUR NEGATIVE 07/22/2020 0401   KETONESUR NEGATIVE 07/22/2020 0401   PROTEINUR NEGATIVE 07/22/2020 0401   NITRITE NEGATIVE 07/22/2020 0401   LEUKOCYTESUR TRACE (A) 07/22/2020 0401   Sepsis Labs: @LABRCNTIP (procalcitonin:4,lacticidven:4) ) Recent Results (from the past 240 hour(s))  Resp Panel by RT-PCR (Flu A&B, Covid) Nasopharyngeal Swab     Status: None   Collection Time: 08/04/20  8:30 PM   Specimen: Nasopharyngeal Swab; Nasopharyngeal(NP) swabs in vial transport medium  Result Value Ref Range Status   SARS Coronavirus 2 by RT PCR NEGATIVE NEGATIVE Final    Comment: (NOTE)  SARS-CoV-2 target nucleic acids are NOT DETECTED.  The SARS-CoV-2 RNA is generally detectable in upper respiratory specimens during the acute phase of infection. The lowest concentration of SARS-CoV-2 viral copies this assay can detect is 138 copies/mL. A negative result does not preclude SARS-Cov-2 infection and should not be used as the sole basis for treatment or other patient management decisions. A negative result may occur with  improper specimen collection/handling, submission of specimen other than nasopharyngeal swab, presence of viral mutation(s) within the areas targeted by this assay, and inadequate number of viral copies(<138 copies/mL). A negative result must be combined with clinical observations, patient history, and epidemiological information. The expected result is Negative.  Fact Sheet for Patients:  EntrepreneurPulse.com.au  Fact Sheet for Healthcare Providers:  IncredibleEmployment.be  This test is no t yet approved or cleared by the [place] FDA and   has been authorized for detection and/or diagnosis of SARS-CoV-2 by FDA under an Emergency Use Authorization (EUA). This EUA will remain  in effect (meaning this test can be used) for the duration of the COVID-19 declaration under Section 564(b)(1) of the Act, 21 U.S.C.section 360bbb-3(b)(1), unless the authorization is terminated  or revoked sooner.       Influenza A by PCR NEGATIVE NEGATIVE Final   Influenza B by PCR NEGATIVE NEGATIVE Final    Comment: (NOTE) The Xpert Xpress SARS-CoV-2/FLU/RSV plus assay is intended as an aid in the diagnosis of influenza from Nasopharyngeal swab specimens and should not be used as a sole basis for treatment. Nasal washings and aspirates are unacceptable for Xpert Xpress SARS-CoV-2/FLU/RSV testing.  Fact Sheet for Patients: EntrepreneurPulse.com.au  Fact Sheet for Healthcare Providers: IncredibleEmployment.be  This test is not yet approved or cleared by the [place] FDA and has been authorized for detection and/or diagnosis of SARS-CoV-2 by FDA under an Emergency Use Authorization (EUA). This EUA will remain in effect (meaning this test can be used) for the duration of the COVID-19 declaration under Section 564(b)(1) of the Act, 21 U.S.C. section 360bbb-3(b)(1), unless the authorization is terminated or revoked.  Performed at [facility], [place] [address], [place], [place] [address]      Radiological Exams on Admission: CT Angio Chest PE W and/or Wo Contrast  Result Date: 08/04/2020 CLINICAL DATA:  Right lower extremity deep venous thrombosis with shortness of breath and chest pain EXAM: CT ANGIOGRAPHY CHEST WITH CONTRAST TECHNIQUE: Multidetector CT imaging of the chest was performed using the standard protocol during bolus administration of intravenous contrast. Multiplanar CT image reconstructions and MIPs were obtained to evaluate the vascular anatomy. CONTRAST:  167m OMNIPAQUE  IOHEXOL 350 MG/ML SOLN COMPARISON:  01/04/2020 FINDINGS: Cardiovascular: Thoracic aorta and its branches are within normal limits. No cardiac enlargement is seen. No coronary calcifications are noted. The pulmonary artery shows a normal branching pattern. A few filling defects are identified within the right pulmonary artery consistent with small pulmonary emboli. No right heart strain is noted. Mediastinum/Nodes: Thoracic inlet is within normal limits. No sizable hilar or mediastinal adenopathy is noted. The esophagus as visualized is within normal limits. Lungs/Pleura: Lungs are clear. No pleural effusion or pneumothorax. Upper Abdomen: Visualized upper abdomen is unremarkable. Musculoskeletal: Degenerative changes of the thoracic spine are noted. Review of the MIP images confirms the above findings. IMPRESSION: Scattered pulmonary emboli particularly on the right without evidence of right heart strain. No other focal abnormality is noted. Critical Value/emergent results were called by telephone at the time of interpretation on 08/04/2020 at 8:19 pm  to Dr. Octaviano Glow , who verbally acknowledged these results. Electronically Signed   By: Inez Catalina M.D.   On: 08/04/2020 20:23   DG Knee Complete 4 Views Right  Result Date: 08/04/2020 CLINICAL DATA:  Right knee pain and swelling. EXAM: RIGHT KNEE - COMPLETE 4+ VIEW COMPARISON:  February 02, 2020. FINDINGS: No evidence of fracture or dislocation. Small suprapatellar joint effusion may be present. No evidence of arthropathy or other focal bone abnormality. Soft tissues are unremarkable. IMPRESSION: Probable small suprapatellar joint effusion. No fracture or dislocation is noted. Electronically Signed   By: Marijo Conception M.D.   On: 08/04/2020 16:26   VAS [place] LOWER EXTREMITY VENOUS (DVT) (ONLY [facility] & [facility] 7a-7p)  Result Date: 08/04/2020  Lower Venous DVT Study Patient Name:  Brittney Carter  Date of Exam:   08/04/2020 Medical Rec #: [id number]           Accession #:    [id number] Date of Birth: [birth year]          Patient Gender: F Patient Age:   59Y Exam Location:  [facility] Procedure:      VAS [place] LOWER EXTREMITY VENOUS (DVT) Referring Phys: [id number] HINA KHATRI --------------------------------------------------------------------------------  Indications: Swelling.  Risk Factors: None identified. Comparison Study: No prior studies. Performing Technologist: Oliver Hum RVT  Examination Guidelines: A complete evaluation includes B-mode imaging, spectral Doppler, color Doppler, and power Doppler as needed of all accessible portions of each vessel. Bilateral testing is considered an integral part of a complete examination. Limited examinations for reoccurring indications may be performed as noted. The reflux portion of the exam is performed with the patient in reverse Trendelenburg.  +---------+---------------+---------+-----------+----------+--------------+ RIGHT    CompressibilityPhasicitySpontaneityPropertiesThrombus Aging +---------+---------------+---------+-----------+----------+--------------+ CFV      Full           Yes      Yes                                 +---------+---------------+---------+-----------+----------+--------------+ SFJ      Full                                                        +---------+---------------+---------+-----------+----------+--------------+ FV Prox  Full                                                        +---------+---------------+---------+-----------+----------+--------------+ FV Mid   Full                                                        +---------+---------------+---------+-----------+----------+--------------+ FV DistalFull                                                        +---------+---------------+---------+-----------+----------+--------------+ PFV  Full                                                         +---------+---------------+---------+-----------+----------+--------------+ POP      None           No       No                   Acute          +---------+---------------+---------+-----------+----------+--------------+ PTV      Partial                                      Acute          +---------+---------------+---------+-----------+----------+--------------+ PERO     Partial                                      Acute          +---------+---------------+---------+-----------+----------+--------------+   +----+---------------+---------+-----------+----------+--------------+ LEFTCompressibilityPhasicitySpontaneityPropertiesThrombus Aging +----+---------------+---------+-----------+----------+--------------+ CFV Full           Yes      Yes                                 +----+---------------+---------+-----------+----------+--------------+     Summary: RIGHT: - Findings consistent with acute deep vein thrombosis involving the right popliteal vein, right posterior tibial veins, and right peroneal veins. - No cystic structure found in the popliteal fossa.  LEFT: - No evidence of common femoral vein obstruction.  *See table(s) above for measurements and observations. Electronically signed by Deitra Mayo MD on 08/04/2020 at 6:43:46 PM.    Final      Assessment/Plan Principal Problem:   Acute pulmonary embolism ([place]) Active Problems:   Chronic pain    1. Acute pulmonary embolism with right lower extremity DVT presently hemodynamically stable likely could be provoked from recent right knee pain and decreased ambulation.  Presently on heparin infusion.  We will continue to monitor CBC since patient had recent rectal bleeding.  If continues to remain stable may change to oral anticoagulation.  Since patient had mild shortness of breath with pulmonary embolism being diagnosed we will check 2D echo.  Check cardiac markers. 2. Recent rectal bleeding diagnosed with COVID  during recent hospitalization in [facility] and may need to get records from [facility].  Patient states she was treated with antibiotics at that time.  Has follow-up with Dr. [name].  Follow CBC. 3. History of depression on Lexapro. 4. Chronic anemia follow CBC. 5. Right knee pain and effusion follows with Dr. Marlou Sa orthopedics.   DVT prophylaxis: Heparin. Code Status: Full code. Family Communication: Discussed with patient. Disposition Plan: Home. Consults called: None. Admission status: Observation.   Rise Patience MD [facility] Pager [phone].  If 7PM-7AM, please contact night-coverage [email or web address] Password [email or web address]  08/04/2020, 10:00 PM

## 2020-08-04 NOTE — ED Provider Notes (Signed)
[facility] DEPT Provider Note   CSN: [id number] Arrival date & time: 08/04/20  1449     History Chief Complaint  Patient presents with  . Knee Pain    Brittney Carter is a 60 y.o. female history of chronic pain, right knee surgery in Dec 2021 (meniscus repair ?), presenting to the ED complaining of right knee and lower extremity pain.  She reports she has been in pain ever since her surgery several months ago.  She says the pain has been intolerable.  She says the pain is worse when trying to walk.  She has been bedbound for the past few days due to pain.  The pain got worse over the past few days.  She called orthopedics office (Dr Marlou Sa) and told their office she was coming to the ER.  She also reports some palpitations, chest pain, shortness of breath.  She endorses lightheadedness and pain "all over my body".  She reports  paresthesias in her right leg, back pain.  She is in the hospital treated earlier this month for proctocolitis and bleeding hemorrhoids, with concern for possible GI bleed.  She reports she supposed to have a colonoscopy done but has not had this done yet.  HPI     Past Medical History:  Diagnosis Date  . Arthritis    Back  . Depression    no medication  . Dyspnea    with exertion     Patient Active Problem List   Diagnosis Date Noted  . Acute pulmonary embolism ([place]) 08/04/2020  . Back pain of thoracolumbar region 10/17/2017  . Long-term current use of opiate analgesic 03/28/2017  . Chronic pain 11/24/2016  . Cervical radiculopathy at C7 12/03/2015  . Sacroiliac joint pain 12/03/2015  . Chronic low back pain 11/25/2014  . Segmental and somatic dysfunction of pelvic region 10/28/2014  . Disc degeneration, lumbar 08/12/2014  . Lumbar radiculopathy 08/12/2014  . Lumbar stenosis 08/12/2014  . Osteoarthritis of spine with radiculopathy, lumbar region 08/12/2014    Past Surgical History:  Procedure Laterality Date   . BACK SURGERY  2016   Laminectomy  . BACK SURGERY  1991   Disectomy  . BREAST SURGERY     Breast lift  . COLONOSCOPY W/ POLYPECTOMY     x 2  . Pain stimulator trail    . SPINAL CORD STIMULATOR INSERTION N/A 11/24/2016   Procedure: LUMBAR SPINAL CORD STIMULATOR INSERTION;  Surgeon: Melina Schools, MD;  Location: [facility];  Service: Orthopedics;  Laterality: N/A;  2.5 hrs  . SPINAL CORD STIMULATOR REMOVAL N/A 06/28/2017   Procedure: Spinal cord stimulator battery removal;  Surgeon: Melina Schools, MD;  Location: [facility];  Service: Orthopedics;  Laterality: N/A;  . TONSILLECTOMY     age 29     OB History   No obstetric history on file.     History reviewed. No pertinent family history.  Social History   Tobacco Use  . Smoking status: Never Smoker  . Smokeless tobacco: Never Used  Vaping Use  . Vaping Use: Never used  Substance Use Topics  . Alcohol use: No  . Drug use: No    Home Medications Prior to Admission medications   Medication Sig Start Date End Date Taking? Authorizing Provider  Artificial Tear Solution (SYSTANE CONTACTS OP) Place 1 drop into both eyes daily as needed (dry eyes).   Yes [provider]  diclofenac Sodium (VOLTAREN) 1 % GEL Apply 2 g topically 4 (four) times  daily as needed (right knee pain).   Yes [provider]  escitalopram (LEXAPRO) 10 MG tablet Take 15 mg by mouth daily. 06/30/20  Yes [provider]  naloxone (NARCAN) nasal spray 4 mg/0.1 mL Place 4 mg into the nose as needed (overdose). 04/04/19  Yes [provider]  traZODone (DESYREL) 50 MG tablet Take 50-100 mg by mouth at bedtime. 07/03/20  Yes [provider]    Allergies    Morphine, Tramadol, and Gabapentin  Review of Systems   Review of Systems  Constitutional: Negative for chills and fever.  Respiratory: Positive for shortness of breath. Negative for cough.   Cardiovascular: Positive for chest pain. Negative for palpitations.   Gastrointestinal: Negative for abdominal pain and vomiting.  Genitourinary: Negative for dysuria and hematuria.  Musculoskeletal: Positive for arthralgias and myalgias.  Skin: Negative for color change and rash.  Neurological: Positive for headaches. Negative for syncope.  All other systems reviewed and are negative.   Physical Exam Updated Vital Signs BP 111/68 (BP Location: Left Arm)   Pulse 70   Temp 98.1 F (36.7 C) (Oral)   Resp 16   SpO2 97%   Physical Exam HENT:     Head: Normocephalic and atraumatic.  Eyes:     Conjunctiva/sclera: Conjunctivae normal.     Pupils: Pupils are equal, round, and reactive to light.  Cardiovascular:     Rate and Rhythm: Normal rate and regular rhythm.     Pulses: Normal pulses.  Pulmonary:     Effort: Pulmonary effort is normal. No respiratory distress.  Abdominal:     General: There is no distension.     Tenderness: There is no abdominal tenderness.  Musculoskeletal:     Comments: Edema of the right knee, mild edema of right lower extremity  Skin:    General: Skin is warm and dry.  Neurological:     General: No focal deficit present.     Mental Status: She is alert and oriented to person, place, and time. Mental status is at baseline.     Comments: Reports paresthesias in right lower leg     ED Results / Procedures / Treatments   Labs (all labs ordered are listed, but only abnormal results are displayed) Labs Reviewed  BASIC METABOLIC PANEL - Abnormal; Notable for the following components:      Result Value   Glucose, Bld 108 (*)    All other components within normal limits  CBC WITH DIFFERENTIAL/PLATELET - Abnormal; Notable for the following components:   Hemoglobin 11.8 (*)    All other components within normal limits  BASIC METABOLIC PANEL - Abnormal; Notable for the following components:   Glucose, Bld 105 (*)    All other components within normal limits  CBC - Abnormal; Notable for the following components:    Hemoglobin 11.1 (*)    HCT 35.5 (*)    All other components within normal limits  HEPARIN LEVEL (UNFRACTIONATED) - Abnormal; Notable for the following components:   Heparin Unfractionated 0.73 (*)    All other components within normal limits  RESP PANEL BY RT-PCR (FLU A&B, COVID) ARPGX2  APTT  PROTIME-INR  HIV ANTIBODY (ROUTINE TESTING W REFLEX)  HEPARIN LEVEL (UNFRACTIONATED)  TROPONIN I (HIGH SENSITIVITY)    EKG EKG Interpretation  Date/Time:  Wednesday August 05 2020 04:42:42 EDT Ventricular Rate:  65 PR Interval:  150 QRS Duration: 90 QT Interval:  416 QTC Calculation: 433 R Axis:   35 Text Interpretation: Sinus rhythm Low  voltage, precordial leads Borderline T abnormalities, anterior leads Confirmed by Veryl Speak [phone]) on 08/05/2020 4:45:39 AM   Radiology CT Angio Chest PE W and/or Wo Contrast  Result Date: 08/04/2020 CLINICAL DATA:  Right lower extremity deep venous thrombosis with shortness of breath and chest pain EXAM: CT ANGIOGRAPHY CHEST WITH CONTRAST TECHNIQUE: Multidetector CT imaging of the chest was performed using the standard protocol during bolus administration of intravenous contrast. Multiplanar CT image reconstructions and MIPs were obtained to evaluate the vascular anatomy. CONTRAST:  134m OMNIPAQUE IOHEXOL 350 MG/ML SOLN COMPARISON:  01/04/2020 FINDINGS: Cardiovascular: Thoracic aorta and its branches are within normal limits. No cardiac enlargement is seen. No coronary calcifications are noted. The pulmonary artery shows a normal branching pattern. A few filling defects are identified within the right pulmonary artery consistent with small pulmonary emboli. No right heart strain is noted. Mediastinum/Nodes: Thoracic inlet is within normal limits. No sizable hilar or mediastinal adenopathy is noted. The esophagus as visualized is within normal limits. Lungs/Pleura: Lungs are clear. No pleural effusion or pneumothorax. Upper Abdomen: Visualized upper abdomen is  unremarkable. Musculoskeletal: Degenerative changes of the thoracic spine are noted. Review of the MIP images confirms the above findings. IMPRESSION: Scattered pulmonary emboli particularly on the right without evidence of right heart strain. No other focal abnormality is noted. Critical Value/emergent results were called by telephone at the time of interpretation on 08/04/2020 at 8:19 pm to Dr. MOctaviano Glow, who verbally acknowledged these results. Electronically Signed   By: MInez CatalinaM.D.   On: 08/04/2020 20:23   DG Knee Complete 4 Views Right  Result Date: 08/04/2020 CLINICAL DATA:  Right knee pain and swelling. EXAM: RIGHT KNEE - COMPLETE 4+ VIEW COMPARISON:  February 02, 2020. FINDINGS: No evidence of fracture or dislocation. Small suprapatellar joint effusion may be present. No evidence of arthropathy or other focal bone abnormality. Soft tissues are unremarkable. IMPRESSION: Probable small suprapatellar joint effusion. No fracture or dislocation is noted. Electronically Signed   By: JMarijo ConceptionM.D.   On: 08/04/2020 16:26   VAS [place]OWER EXTREMITY VENOUS (DVT) (ONLY [facility] & [facility] 7a-7p)  Result Date: 08/04/2020  Lower Venous DVT Study Patient Name:  AJERRY HAUGEN Date of Exam:   08/04/2020 Medical Rec #: [id number]         Accession #:    [id number] of Birth: [birth year]         Patient Gender: F Patient Age:   59Y [facility] [place]OWER EXTREMITY VENOUS (DVT) Referring Phys: [id number] KHATRI --------------------------------------------------------------------------------  Indications: Swelling.  Risk Factors: None identified. Comparison Study: No prior studies. Performing Technologist: GOliver HumRVT  Examination Guidelines: A complete evaluation includes B-mode imaging, spectral Doppler, color Doppler, and power Doppler as needed of all accessible portions of each vessel. Bilateral testing is considered an integral part of a complete  examination. Limited examinations for reoccurring indications may be performed as noted. The reflux portion of the exam is performed with the patient in reverse Trendelenburg.  +---------+---------------+---------+-----------+----------+--------------+ RIGHT    CompressibilityPhasicitySpontaneityPropertiesThrombus Aging +---------+---------------+---------+-----------+----------+--------------+ CFV      Full           Yes      Yes                                 +---------+---------------+---------+-----------+----------+--------------+ SFJ  Full                                                        +---------+---------------+---------+-----------+----------+--------------+ FV Prox  Full                                                        +---------+---------------+---------+-----------+----------+--------------+ FV Mid   Full                                                        +---------+---------------+---------+-----------+----------+--------------+ FV DistalFull                                                        +---------+---------------+---------+-----------+----------+--------------+ PFV      Full                                                        +---------+---------------+---------+-----------+----------+--------------+ POP      None           No       No                   Acute          +---------+---------------+---------+-----------+----------+--------------+ PTV      Partial                                      Acute          +---------+---------------+---------+-----------+----------+--------------+ PERO     Partial                                      Acute          +---------+---------------+---------+-----------+----------+--------------+   +----+---------------+---------+-----------+----------+--------------+ LEFTCompressibilityPhasicitySpontaneityPropertiesThrombus Aging  +----+---------------+---------+-----------+----------+--------------+ CFV Full           Yes      Yes                                 +----+---------------+---------+-----------+----------+--------------+     Summary: RIGHT: - Findings consistent with acute deep vein thrombosis involving the right popliteal vein, right posterior tibial veins, and right peroneal veins. - No cystic structure found in the popliteal fossa.  LEFT: - No evidence of common femoral vein obstruction.  *See table(s) above for measurements and observations. Electronically signed by Deitra Mayo MD on 08/04/2020 at 6:43:46 PM.    Final     Procedures Procedures   Medications Ordered  in ED Medications  escitalopram (LEXAPRO) tablet 15 mg (15 mg Oral Given 08/05/20 0956)  traZODone (DESYREL) tablet 50-100 mg (50 mg Oral Given 08/04/20 2222)  acetaminophen (TYLENOL) tablet 650 mg (has no administration in time range)    Or  acetaminophen (TYLENOL) suppository 650 mg (has no administration in time range)  heparin ADULT infusion 100 units/mL (25000 units/279m) (1,200 Units/hr Intravenous Rate/Dose Change 08/05/20 0441)  HYDROmorphone (DILAUDID) injection 0.5 mg (0.5 mg Intravenous Given 08/05/20 0803)  HYDROmorphone (DILAUDID) injection 1 mg (1 mg Intravenous Given 08/04/20 1857)  iohexol (OMNIPAQUE) 350 MG/ML injection 100 mL (100 mLs Intravenous Contrast Given 08/04/20 1953)  HYDROmorphone (DILAUDID) injection 1 mg (1 mg Intravenous Given 08/04/20 2007)  oxyCODONE (Oxy IR/ROXICODONE) immediate release tablet 5 mg (5 mg Oral Given 08/04/20 2221)  heparin bolus via infusion 4,000 Units (4,000 Units Intravenous Bolus from Bag 08/04/20 2305)    ED Course  I have reviewed the triage vital signs and the nursing notes.  Pertinent labs & imaging results that were available during my care of the patient were reviewed by me and considered in my medical decision making (see chart for details).  60yo female here with right lower  extremity pain and swelling, chest pain and shortness of breath.    DDx includes DVT vs post-operative edema/lymphadema vs PE vs other  Labs reviewed -BMP and CBC unremarkable DG knee with small effusion noted DVT study demonstrates large right lower extremity DVT involving the right popliteal vein, right posterior tibial veins, and right peroneal veins. CT PE with multiple subsugmental and peripheral PE, but evidence of heart strain.    Vitals normal - no hypoxia or hypotension to suggest massive PE  Clinically she does not present with PCD of the lower extremity to warrant emergent thrombectomy at this time.  She will need A/C.  To discuss with hospitalist options - as noted below - given her hx of GI bleed.  IV pain medications ordered for her lower extremity pain   Clinical Course as of 08/05/20 1034  Tue Aug 04, 2020  2029 IMPRESSION: Scattered pulmonary emboli particularly on the right without evidence of right heart strain.  No other focal abnormality is noted.  Critical Value/emergent results were called by telephone at the time of interpretation on 08/04/2020 at 8:19 pm to Dr. MOctaviano Glow, who verbally acknowledged these results. [MT]  2029 No RHS pattern on CT PE.  I'll discuss a/c options with hospitalist - we may consider initiating oral DOAC here, but she will need observation as a GI bleeding risk, and for pain control with her right extremity. [MT]    Clinical Course User Index [MT] Krystie Leiter, MCarola Rhine MD    Final Clinical Impression(s) / ED Diagnoses Final diagnoses:  Acute deep vein thrombosis (DVT) of distal vein of right lower extremity [place]  Other acute pulmonary embolism without acute cor pulmonale ([facility]    Rx / DC Orders ED Discharge Orders    None       TWyvonnia Dusky MD 08/05/20 1034

## 2020-08-04 NOTE — Telephone Encounter (Signed)
Patient called advised her knee and toes are swollen and she can not put any pressure down on her right leg. Patient said her knee is full of infection and full of fluid. Patient said she has been in the bed off and on for 2 weeks now. Patient asked if she can get an appointment sooner than Friday. Patient said she is getting sicker laying in the bed. The number to contact patient is [phone]

## 2020-08-04 NOTE — ED Notes (Signed)
Provided pt with food and drink.

## 2020-08-04 NOTE — Patient Outreach (Signed)
[facility]) Care Management  08/04/2020  CAROLIE MCILRATH Sep 16, 1960 [id number]   Transition of care  Outreach #2 RN attempted outreach call today however unsuccessful. RN able to leave a HIPAA approved voice message requesting a call back.  Will attempted another outreach call over the next for pending services.  [name], RN Care Management Coordinator [place] Office [phone]

## 2020-08-04 NOTE — Progress Notes (Signed)
ANTICOAGULATION CONSULT NOTE - Initial Consult  Pharmacy Consult for heparin Indication: pulmonary embolus  Allergies  Allergen Reactions  . Morphine Anaphylaxis, Hives, Shortness Of Breath and Rash  . Tramadol Anaphylaxis, Swelling and Rash    headache  . Gabapentin Rash and Swelling    Patient Measurements:   Heparin Dosing Weight: 81.2kg  Vital Signs: Temp: 98.5 F (36.9 C) (05/31 1822) Temp Source: Oral (05/31 1822) BP: 104/77 (05/31 2100) Pulse Rate: 67 (05/31 2100)  Labs: Recent Labs    08/04/20 1856  HGB 11.8*  HCT 37.3  PLT 361  CREATININE 0.84    CrCl cannot be calculated (Unknown ideal weight.).   Medical History: Past Medical History:  Diagnosis Date  . Arthritis    Back  . Depression    no medication  . Dyspnea    with exertion      Assessment: 60 yo female complains of R knee pain, swelling. Has a hx of meniscus surgery a few months ago and has had fluid drained from the knee twice in the past 4 months.  Pharmacy consulted to dose heparin for PE.  No prior AC noted  08/04/2020 Hgb 11.8, Plts WNL Scr 0.84 Baseline labs ordered stat  Goal of Therapy:  Heparin level 0.3-0.7 units/ml Monitor platelets by anticoagulation protocol   Plan:  Heparin bolus 4000 units x 1 Start heparin drip at 1300 units/hr Heparin level in 6 hours Daily CBC   Dolly Rias RPh 08/04/2020, 10:13 PM

## 2020-08-04 NOTE — ED Provider Notes (Signed)
Emergency Medicine Provider Triage Evaluation Note  Brittney Carter , a 60 y.o. female  was evaluated in triage.  Pt complains of right knee pain, swelling.  Has a history of meniscus surgery a few months ago and has had fluid drained from the knee twice in the past 4 months.  Reports worsening pain and swelling today.  She reports cramping in her right calf area.  No injuries.  Review of Systems  Positive: Right knee pain, swelling and cramping sensation Negative: Fever  Physical Exam  BP (!) 119/102 (BP Location: Left Arm)   Pulse 81   Temp 98.2 F (36.8 C) (Oral)   Resp 16   SpO2 99%  Gen:   Awake, no distress   Resp:  Normal effort  MSK:   Moves extremities without difficulty  Other:  Swelling and tenderness noted of the right knee with limited range of motion  Medical Decision Making  Medically screening exam initiated at 3:04 PM.  Appropriate orders placed.  [name] was informed that the remainder of the evaluation will be completed by another provider, this initial triage assessment does not replace that evaluation, and the importance of remaining in the ED until their evaluation is complete.  Will order x-ray and DVT study   Delia Heady, PA-C 08/04/20 [place], Robert, MD 08/04/20 [phone]

## 2020-08-04 NOTE — Telephone Encounter (Signed)
FYI see below. Recent messages states patient went to ER

## 2020-08-04 NOTE — Progress Notes (Signed)
Right lower extremity venous duplex has been completed. Preliminary results can be found in CV Proc through chart review.  Results were given to Dr [facility] PA.  08/04/20 4:27 PM Carlos Levering RVT

## 2020-08-04 NOTE — Telephone Encounter (Signed)
Pt called again regarding the message below.  Pt is crying stating that she is nauseated and she is in extreme pain her toes are so swollen the skin Is coming off.   Pt stated that she is going to call 911 and go to [facility] ER.

## 2020-08-04 NOTE — Telephone Encounter (Signed)
FYI

## 2020-08-04 NOTE — Telephone Encounter (Signed)
Pt called stating the ambulance is taking her to [place] because [facility] turned her away; pt would like to have Dr.Dean come and see her at [facility] long as she can't walk.   [phone]

## 2020-08-04 NOTE — ED Triage Notes (Signed)
Per EMS, pt complains of right knee pain. She has hx of right laparoscopic reconstruction and has had fluid drained from knee twice in past 4 months. Her knee is swollen and painful today.

## 2020-08-05 ENCOUNTER — Observation Stay (HOSPITAL_COMMUNITY): Payer: Medicare Other

## 2020-08-05 ENCOUNTER — Telehealth: Payer: Self-pay

## 2020-08-05 DIAGNOSIS — R079 Chest pain, unspecified: Secondary | ICD-10-CM | POA: Diagnosis not present

## 2020-08-05 LAB — CBC
HCT: 35.5 % — ABNORMAL LOW (ref 36.0–46.0)
Hemoglobin: 11.1 g/dL — ABNORMAL LOW (ref 12.0–15.0)
MCH: 26.4 pg (ref 26.0–34.0)
MCHC: 31.3 g/dL (ref 30.0–36.0)
MCV: 84.5 fL (ref 80.0–100.0)
Platelets: 337 10*3/uL (ref 150–400)
RBC: 4.2 MIL/uL (ref 3.87–5.11)
RDW: 15.1 % (ref 11.5–15.5)
WBC: 8.6 10*3/uL (ref 4.0–10.5)
nRBC: 0 % (ref 0.0–0.2)

## 2020-08-05 LAB — ECHOCARDIOGRAM COMPLETE
Area-P 1/2: 2.62 cm2
Height: 61 in
S' Lateral: 3.9 cm
Weight: 2864.22 oz

## 2020-08-05 LAB — BASIC METABOLIC PANEL
Anion gap: 5 (ref 5–15)
BUN: 13 mg/dL (ref 6–20)
CO2: 31 mmol/L (ref 22–32)
Calcium: 8.9 mg/dL (ref 8.9–10.3)
Chloride: 104 mmol/L (ref 98–111)
Creatinine, Ser: 0.77 mg/dL (ref 0.44–1.00)
GFR, Estimated: >60 mL/min (ref >60)
Glucose, Bld: 105 mg/dL — ABNORMAL HIGH (ref 70–99)
Potassium: 3.7 mmol/L (ref 3.5–5.1)
Sodium: 140 mmol/L (ref 135–145)

## 2020-08-05 LAB — HIV ANTIBODY (ROUTINE TESTING W REFLEX): HIV Screen 4th Generation wRfx: NONREACTIVE

## 2020-08-05 LAB — TROPONIN I (HIGH SENSITIVITY): Troponin I (High Sensitivity): <2 ng/L (ref <18)

## 2020-08-05 LAB — HEPARIN LEVEL (UNFRACTIONATED)
Heparin Unfractionated: 0.73 IU/mL — ABNORMAL HIGH (ref 0.30–0.70)
Heparin Unfractionated: 0.33 IU/mL (ref 0.30–0.70)
Heparin Unfractionated: 0.32 IU/mL (ref 0.30–0.70)

## 2020-08-05 MED ORDER — OXYCODONE-ACETAMINOPHEN 5-325 MG PO TABS
1.0000 | ORAL_TABLET | ORAL | Status: DC | PRN
  Administered 2020-08-05 – 2020-08-06 (×2): 1 via ORAL
  Filled 2020-08-05 (×2): qty 1

## 2020-08-05 MED ORDER — METHOCARBAMOL 500 MG PO TABS: 500.0000 mg | ORAL_TABLET | Freq: Three times a day (TID) | ORAL | Status: DC | PRN

## 2020-08-05 MED ORDER — HYDROMORPHONE HCL 1 MG/ML IJ SOLN
0.5000 mg | INTRAMUSCULAR | Status: DC | PRN
  Administered 2020-08-05 (×3): 0.5 mg via INTRAVENOUS
  Filled 2020-08-05 (×2): qty 1

## 2020-08-05 MED ORDER — HYDROMORPHONE HCL 1 MG/ML IJ SOLN
1.0000 mg | INTRAMUSCULAR | Status: DC | PRN
  Administered 2020-08-05 – 2020-08-07 (×9): 1 mg via INTRAVENOUS
  Filled 2020-08-05 (×9): qty 1

## 2020-08-05 MED ORDER — MAGNESIUM OXIDE -MG SUPPLEMENT 400 (240 MG) MG PO TABS: 400.0000 mg | ORAL_TABLET | Freq: Two times a day (BID) | ORAL | Status: DC | PRN

## 2020-08-05 NOTE — ED Notes (Signed)
Pt given meal tray. Temp taken before meal was given. Temp 97.9

## 2020-08-05 NOTE — Progress Notes (Signed)
ANTICOAGULATION CONSULT NOTE - follow up  Pharmacy Consult for heparin Indication: pulmonary embolus  Allergies  Allergen Reactions  . Morphine Anaphylaxis, Hives, Shortness Of Breath and Rash  . Tramadol Anaphylaxis, Swelling and Rash    headache  . Gabapentin Rash and Swelling    Patient Measurements:   Heparin Dosing Weight: 81.2kg  Vital Signs: Temp: 98.5 F (36.9 C) (05/31 1822) Temp Source: Oral (05/31 1822) BP: 96/68 (06/01 0130) Pulse Rate: 61 (06/01 0130)  Labs: Recent Labs    08/04/20 1856 08/04/20 2211 08/05/20 0340  HGB 11.8*  --  11.1*  HCT 37.3  --  35.5*  PLT 361  --  337  APTT  --  32  --   LABPROT  --  13.2  --   INR  --  1.0  --   HEPARINUNFRC  --   --  0.73*  CREATININE 0.84  --  0.77    CrCl cannot be calculated (Unknown ideal weight.).   Medical History: Past Medical History:  Diagnosis Date  . Arthritis    Back  . Depression    no medication  . Dyspnea    with exertion      Assessment: 60 yo female complains of R knee pain, swelling. Has a hx of meniscus surgery a few months ago and has had fluid drained from the knee twice in the past 4 months.  Pharmacy consulted to dose heparin for PE.  No prior AC noted  08/05/2020 HL 0.73 slightly supra-therapeutic Hgb down to 11.1, plts WNL Per RN no bleeding or infusion issues   Goal of Therapy:  Heparin level 0.3-0.7 units/ml Monitor platelets by anticoagulation protocol   Plan:  decrease heparin drip to 1200 units/hr Heparin level in 6 hours Daily CBC   Dolly Rias RPh 08/05/2020, 4:36 AM

## 2020-08-05 NOTE — Plan of Care (Signed)
Pt admitted for DVT and PE, on heparin 60m hr. PRN pain meds given with some relief.  BP 111/68 (BP Location: Left Arm)   Pulse 70   Temp 98.1 F (36.7 C) (Oral)   Resp 16   Ht 5' 1"  (1.549 m)   Wt 81.2 kg   SpO2 97%   BMI 33.82 kg/m  Pt oriented to room, bed in lowest position, call bell nearby. Pt did not appear or verbalize any distress at this time. NHal Neer

## 2020-08-05 NOTE — Telephone Encounter (Signed)
patient called she stated she was seen in the ED yesterday she stated she has a blood clot in her right knee, she is still having swelling patient also stated she has a blood clot in her lung she is requesting a call back from Dr.Dean :[phone]

## 2020-08-05 NOTE — Progress Notes (Signed)
ANTICOAGULATION CONSULT NOTE - follow up  Pharmacy Consult for heparin Indication: pulmonary embolus  Allergies  Allergen Reactions  . Morphine Anaphylaxis, Hives, Shortness Of Breath and Rash  . Tramadol Anaphylaxis, Swelling and Rash    headache  . Gabapentin Rash and Swelling    Patient Measurements: Height: 5' 1"  (154.9 cm) Weight: 81.2 kg (179 lb 0.2 oz) IBW/kg (Calculated) : 47.8 Heparin Dosing Weight: 66 kg  Vital Signs: Temp: 98.3 F (36.8 C) (06/01 1325) Temp Source: Oral (06/01 1325) BP: 111/74 (06/01 1325) Pulse Rate: 67 (06/01 1325)  Labs: Recent Labs    08/04/20 1856 08/04/20 2211 08/05/20 0340 08/05/20 0349 08/05/20 1053 08/05/20 1653  HGB 11.8*  --  11.1*  --   --   --   HCT 37.3  --  35.5*  --   --   --   PLT 361  --  337  --   --   --   APTT  --  32  --   --   --   --   LABPROT  --  13.2  --   --   --   --   INR  --  1.0  --   --   --   --   HEPARINUNFRC  --   --  0.73*  --  0.33 0.32  CREATININE 0.84  --  0.77  --   --   --   TROPONINIHS  --   --   --  <2  --   --     Estimated Creatinine Clearance: 73.2 mL/min (by C-G formula based on SCr of 0.77 mg/dL).   Medical History: Past Medical History:  Diagnosis Date  . Arthritis    Back  . Depression    no medication  . Dyspnea    with exertion    Assessment: Pt is a 28 yoF presenting with complaints of knee/calf pain and swelling and SOB. Pt is s/p right knee arthroscopy for partial meniscectomy several months ago. Reports recent admission to [facility] with rectal bleeding. No anticoagulants PTA.  5/31 Venous doppler: Acute RLE DVT 5/31 CTA: Scattered PE particularly on right side, no evidence right heart strain  Pharmacy consulted to dose/monitor heparin for VTE treatment.  08/05/2020  Confirmatory heparin level drawn at 1653 = 0.32 remains therapeutic on heparin infusion of 1200 units/hr  CBC: Hgb slightly low but stable; Plt WNL  Confirmed with RN that heparin infusing at  correct rate. No signs of bleeding. No interruptions.  Goal of Therapy:  Heparin level 0.3-0.7 units/ml Monitor platelets by anticoagulation protocol   Plan:   Continue heparin infusion at current rate of 1200 units/hr  HL, CBC daily while on heparin infusion  Monitor for signs of bleeding  F/u transition to [place], Pharm.D 08/05/2020 5:33 PM

## 2020-08-05 NOTE — Progress Notes (Signed)
PROGRESS NOTE    Brittney Carter   ZJQ:[id number]  DOB: April 13, 1960  PCP: [name], MD    DOA: 08/04/2020 LOS: 0   Brief Narrative   Brittney Carter is a 60 y.o. female with history of depression who has been having pain in the right knee since recent surgery has been having arthrocentesis by patient's orthopedist and last 1 was about a month ago has been having increasing pain in the right knee and in addition patient also complained of right calf pain.  Over the last 2 weeks the pain has been worsening along with patient having mild shortness of breath.  Denies any fever chills.   Patient states 3 weeks ago she was admitted at [facility] for rectal bleeding and was told that she had colitis and has outpatient follow-up with Dr. [name] of gastroenterology.   ED Course: In the ER x-rays revealed mild effusion in the right knee.  Dopplers done shows DVT of the right lower extremity and CT angiogram shows pulm embolism.  No strain pattern.  Given that patient had recent rectal bleeding admitted for further observation in the setting of patient being started on anticoagulation.  COVID test was negative.    Assessment & Plan   Principal Problem:   Acute pulmonary embolism (HCC) Active Problems:   Chronic pain   Acute pulmonary emboli Right lower extremity DVT Hemodynamically stable.  Likely provoked by immobility related to her recent right knee pain.  No sign of right heart strain on CTA chest. Patient having dyspnea on exertion and chest discomfort, on room air today (6/1) --Continue on heparin infusion -- Monitor closely for bleeding, had recent rectal bleeding thought related to hemorrhoids requiring admission at [facility]. -- If tolerating anticoagulation without bleeding, likely transition to Eliquis in the next 24 to 48 hours -- Follow-up pending echo -- Troponins negative  Right lower extremity spasms -suspect related to DVT.  Trial of Robaxin or oral magnesium.   Monitor electrolytes.  Expect improvement as clot burden clears.  Recent rectal bleeding -reported admission at [facility].  Was treated with antibiotics for colitis.  Plans to follow-up with Dr. [name] of gastroenterology -- Monitor CBC and for signs of bleeding  History of depression -stable.  Continue Lexapro  Chronic anemia -stable.  Monitor CBC  Right knee pain with effusion -follows with Dr. Marlou Sa in orthopedics.  Has had a couple of outpatient aspirations.  Has been immobile due to knee pain.  Patient BMI: There is no height or weight on file to calculate BMI.   DVT prophylaxis:    Diet:  Diet Orders (From admission, onward)    Start     Ordered   08/04/20 2200  Diet Heart Room service appropriate? Yes; Fluid consistency: Thin  Diet effective now       Question Answer Comment  Room service appropriate? Yes   Fluid consistency: Thin      08/04/20 2200            Code Status: Full Code    Subjective 08/05/20    Patient reports not feeling well when seen today.  Having a lot of pain in her right leg and knee as well as chest discomfort.  She does feel a little bit short of breath but has been weaned off oxygen.   Disposition Plan & Communication   Status is: Observation  The patient remains OBS appropriate and will d/c before 2 midnights.  Dispo: The patient is from: Home  Anticipated d/c is to: Home              Patient currently is not medically stable to d/c.   Difficult to place patient No    Consults, Procedures, Significant Events   Consultants:   None  Procedures:   None  Antimicrobials:  Anti-infectives (From admission, onward)   None        Micro    Objective   Vitals:   08/05/20 0630 08/05/20 0645 08/05/20 0646 08/05/20 0830  BP: 97/69   95/69  Pulse: (!) 57 (!) 59 62 (!) 59  Resp: 12   16  Temp:      TempSrc:      SpO2: 96%   95%   No intake or output data in the 24 hours ending 08/05/20 0857 There were  no vitals filed for this visit.  Physical Exam:  General exam: awake, alert, no acute distress, mildly ill-appearing, appears uncomfortable HEENT: atraumatic, clear conjunctiva, anicteric sclera, moist mucus membranes, hearing grossly normal  Respiratory system: CTAB, no wheezes, rales or rhonchi, normal respiratory effort. Cardiovascular system: normal S1/S2, RRR, no JVD, murmurs, rubs, gallops, no pedal edema.   Gastrointestinal system: soft, NT, ND, no HSM felt, +bowel sounds. Central nervous system: A&O x3. no gross focal neurologic deficits, normal speech Extremities: Right calf tender on palpation, right knee with lateral effusion which is not very fluctuant but quite tender on palpation, no warmth or erythema of the extremity, no edema, normal tone Psychiatry: normal mood, congruent affect, judgement and insight appear normal  Labs   Data Reviewed: I have personally reviewed following labs and imaging studies  CBC: Recent Labs  Lab 08/04/20 1856 08/05/20 0340  WBC 8.0 8.6  NEUTROABS 5.4  --   HGB 11.8* 11.1*  HCT 37.3 35.5*  MCV 83.6 84.5  PLT 361 157   Basic Metabolic Panel: Recent Labs  Lab 08/04/20 1856 08/05/20 0340  NA 140 140  K 3.8 3.7  CL 104 104  CO2 28 31  GLUCOSE 108* 105*  BUN 10 13  CREATININE 0.84 0.77  CALCIUM 9.8 8.9   GFR: CrCl cannot be calculated (Unknown ideal weight.). Liver Function Tests: No results for input(s): AST, ALT, ALKPHOS, BILITOT, PROT, ALBUMIN in the last 168 hours. No results for input(s): LIPASE, AMYLASE in the last 168 hours. No results for input(s): AMMONIA in the last 168 hours. Coagulation Profile: Recent Labs  Lab 08/04/20 2211  INR 1.0   Cardiac Enzymes: No results for input(s): CKTOTAL, CKMB, CKMBINDEX, TROPONINI in the last 168 hours. BNP (last 3 results) No results for input(s): PROBNP in the last 8760 hours. HbA1C: No results for input(s): HGBA1C in the last 72 hours. CBG: No results for input(s): GLUCAP  in the last 168 hours. Lipid Profile: No results for input(s): CHOL, HDL, LDLCALC, TRIG, CHOLHDL, LDLDIRECT in the last 72 hours. Thyroid Function Tests: No results for input(s): TSH, T4TOTAL, FREET4, T3FREE, THYROIDAB in the last 72 hours. Anemia Panel: No results for input(s): VITAMINB12, FOLATE, FERRITIN, TIBC, IRON, RETICCTPCT in the last 72 hours. Sepsis Labs: No results for input(s): PROCALCITON, LATICACIDVEN in the last 168 hours.  Recent Results (from the past 240 hour(s))  Resp Panel by RT-PCR (Flu A&B, Covid) Nasopharyngeal Swab     Status: None   Collection Time: 08/04/20  8:30 PM   Specimen: Nasopharyngeal Swab; Nasopharyngeal(NP) swabs in vial transport medium  Result Value Ref Range Status   SARS Coronavirus 2 by RT PCR NEGATIVE NEGATIVE Final  Comment: (NOTE) SARS-CoV-2 target nucleic acids are NOT DETECTED.  The SARS-CoV-2 RNA is generally detectable in upper respiratory specimens during the acute phase of infection. The lowest concentration of SARS-CoV-2 viral copies this assay can detect is 138 copies/mL. A negative result does not preclude SARS-Cov-2 infection and should not be used as the sole basis for treatment or other patient management decisions. A negative result may occur with  improper specimen collection/handling, submission of specimen other than nasopharyngeal swab, presence of viral mutation(s) within the areas targeted by this assay, and inadequate number of viral copies(<138 copies/mL). A negative result must be combined with clinical observations, patient history, and epidemiological information. The expected result is Negative.  Fact Sheet for Patients:  EntrepreneurPulse.com.au  Fact Sheet for Healthcare Providers:  IncredibleEmployment.be  This test is no t yet approved or cleared by the [place] FDA and  has been authorized for detection and/or diagnosis of SARS-CoV-2 by FDA under an Emergency Use  Authorization (EUA). This EUA will remain  in effect (meaning this test can be used) for the duration of the COVID-19 declaration under Section 564(b)(1) of the Act, 21 U.S.C.section 360bbb-3(b)(1), unless the authorization is terminated  or revoked sooner.       Influenza A by PCR NEGATIVE NEGATIVE Final   Influenza B by PCR NEGATIVE NEGATIVE Final    Comment: (NOTE) The Xpert Xpress SARS-CoV-2/FLU/RSV plus assay is intended as an aid in the diagnosis of influenza from Nasopharyngeal swab specimens and should not be used as a sole basis for treatment. Nasal washings and aspirates are unacceptable for Xpert Xpress SARS-CoV-2/FLU/RSV testing.  Fact Sheet for Patients: EntrepreneurPulse.com.au  Fact Sheet for Healthcare Providers: IncredibleEmployment.be  This test is not yet approved or cleared by the [place] FDA and has been authorized for detection and/or diagnosis of SARS-CoV-2 by FDA under an Emergency Use Authorization (EUA). This EUA will remain in effect (meaning this test can be used) for the duration of the COVID-19 declaration under Section 564(b)(1) of the Act, 21 U.S.C. section 360bbb-3(b)(1), unless the authorization is terminated or revoked.  Performed at [facility], [place] [address], [place], [place] [address]       Imaging Studies   CT Angio Chest PE W and/or Wo Contrast  Result Date: 08/04/2020 CLINICAL DATA:  Right lower extremity deep venous thrombosis with shortness of breath and chest pain EXAM: CT ANGIOGRAPHY CHEST WITH CONTRAST TECHNIQUE: Multidetector CT imaging of the chest was performed using the standard protocol during bolus administration of intravenous contrast. Multiplanar CT image reconstructions and MIPs were obtained to evaluate the vascular anatomy. CONTRAST:  136m OMNIPAQUE IOHEXOL 350 MG/ML SOLN COMPARISON:  01/04/2020 FINDINGS: Cardiovascular: Thoracic aorta and its branches are  within normal limits. No cardiac enlargement is seen. No coronary calcifications are noted. The pulmonary artery shows a normal branching pattern. A few filling defects are identified within the right pulmonary artery consistent with small pulmonary emboli. No right heart strain is noted. Mediastinum/Nodes: Thoracic inlet is within normal limits. No sizable hilar or mediastinal adenopathy is noted. The esophagus as visualized is within normal limits. Lungs/Pleura: Lungs are clear. No pleural effusion or pneumothorax. Upper Abdomen: Visualized upper abdomen is unremarkable. Musculoskeletal: Degenerative changes of the thoracic spine are noted. Review of the MIP images confirms the above findings. IMPRESSION: Scattered pulmonary emboli particularly on the right without evidence of right heart strain. No other focal abnormality is noted. Critical Value/emergent results were called by telephone at the time of interpretation on 08/04/2020  at 8:19 pm to Dr. Octaviano Glow , who verbally acknowledged these results. Electronically Signed   By: Inez Catalina M.D.   On: 08/04/2020 20:23   DG Knee Complete 4 Views Right  Result Date: 08/04/2020 CLINICAL DATA:  Right knee pain and swelling. EXAM: RIGHT KNEE - COMPLETE 4+ VIEW COMPARISON:  February 02, 2020. FINDINGS: No evidence of fracture or dislocation. Small suprapatellar joint effusion may be present. No evidence of arthropathy or other focal bone abnormality. Soft tissues are unremarkable. IMPRESSION: Probable small suprapatellar joint effusion. No fracture or dislocation is noted. Electronically Signed   By: Marijo Conception M.D.   On: 08/04/2020 16:26   VAS [place] LOWER EXTREMITY VENOUS (DVT) (ONLY [facility] & [facility] 7a-7p)  Result Date: 08/04/2020  Lower Venous DVT Study Patient Name:  ENDORA TERESI  Date of Exam:   08/04/2020 Medical Rec #: [id number]          Accession #:    [id number] Date of Birth: 1960/06/20          Patient Gender: F Patient Age:   59Y Exam Location:   [facility] Procedure:      VAS [place] LOWER EXTREMITY VENOUS (DVT) Referring Phys: [id number] HINA KHATRI --------------------------------------------------------------------------------  Indications: Swelling.  Risk Factors: None identified. Comparison Study: No prior studies. Performing Technologist: Oliver Hum RVT  Examination Guidelines: A complete evaluation includes B-mode imaging, spectral Doppler, color Doppler, and power Doppler as needed of all accessible portions of each vessel. Bilateral testing is considered an integral part of a complete examination. Limited examinations for reoccurring indications may be performed as noted. The reflux portion of the exam is performed with the patient in reverse Trendelenburg.  +---------+---------------+---------+-----------+----------+--------------+ RIGHT    CompressibilityPhasicitySpontaneityPropertiesThrombus Aging +---------+---------------+---------+-----------+----------+--------------+ CFV      Full           Yes      Yes                                 +---------+---------------+---------+-----------+----------+--------------+ SFJ      Full                                                        +---------+---------------+---------+-----------+----------+--------------+ FV Prox  Full                                                        +---------+---------------+---------+-----------+----------+--------------+ FV Mid   Full                                                        +---------+---------------+---------+-----------+----------+--------------+ FV DistalFull                                                        +---------+---------------+---------+-----------+----------+--------------+ PFV  Full                                                        +---------+---------------+---------+-----------+----------+--------------+ POP      None           No       No                   Acute           +---------+---------------+---------+-----------+----------+--------------+ PTV      Partial                                      Acute          +---------+---------------+---------+-----------+----------+--------------+ PERO     Partial                                      Acute          +---------+---------------+---------+-----------+----------+--------------+   +----+---------------+---------+-----------+----------+--------------+ LEFTCompressibilityPhasicitySpontaneityPropertiesThrombus Aging +----+---------------+---------+-----------+----------+--------------+ CFV Full           Yes      Yes                                 +----+---------------+---------+-----------+----------+--------------+     Summary: RIGHT: - Findings consistent with acute deep vein thrombosis involving the right popliteal vein, right posterior tibial veins, and right peroneal veins. - No cystic structure found in the popliteal fossa.  LEFT: - No evidence of common femoral vein obstruction.  *See table(s) above for measurements and observations. Electronically signed by Deitra Mayo MD on 08/04/2020 at 6:43:46 PM.    Final      Medications   Scheduled Meds: . escitalopram  15 mg Oral Daily  . traZODone  50-100 mg Oral QHS   Continuous Infusions: . heparin 1,200 Units/hr (08/05/20 0441)       LOS: 0 days    Time spent: 30 minutes    Ezekiel Slocumb, DO [facility]  08/05/2020, 8:57 AM      If 7PM-7AM, please contact night-coverage. How to contact the [facility] Attending or Consulting provider [place] or covering provider during after hours [place], for this patient?    1. Check the care team in [facility] and look for a) attending/consulting [facility] provider listed and b) the [facility] team listed 2. Log into [email or web address] and use [place]'s universal password to access. If you do not have the password, please contact the hospital operator. 3. Locate the [facility] provider you are  looking for under [facility] and page to a number that you can be directly reached. 4. If you still have difficulty reaching the provider, please page the [facility] (Director on Call) for the Hospitalists listed on amion for assistance.

## 2020-08-05 NOTE — Progress Notes (Signed)
  Echocardiogram 2D Echocardiogram with 3D and strain has been performed.  Darlina Sicilian M 08/05/2020, 2:05 PM

## 2020-08-05 NOTE — Progress Notes (Signed)
ANTICOAGULATION CONSULT NOTE - follow up  Pharmacy Consult for heparin Indication: pulmonary embolus  Allergies  Allergen Reactions  . Morphine Anaphylaxis, Hives, Shortness Of Breath and Rash  . Tramadol Anaphylaxis, Swelling and Rash    headache  . Gabapentin Rash and Swelling    Patient Measurements: Height: 5' 1"  (154.9 cm) Weight: 81.2 kg (179 lb 0.2 oz) IBW/kg (Calculated) : 47.8 Heparin Dosing Weight: 66 kg  Vital Signs: Temp: 98.1 F (36.7 C) (06/01 1019) Temp Source: Oral (06/01 1019) BP: 111/68 (06/01 1019) Pulse Rate: 70 (06/01 1019)  Labs: Recent Labs    08/04/20 1856 08/04/20 2211 08/05/20 0340 08/05/20 0349 08/05/20 1053  HGB 11.8*  --  11.1*  --   --   HCT 37.3  --  35.5*  --   --   PLT 361  --  337  --   --   APTT  --  32  --   --   --   LABPROT  --  13.2  --   --   --   INR  --  1.0  --   --   --   HEPARINUNFRC  --   --  0.73*  --  0.33  CREATININE 0.84  --  0.77  --   --   TROPONINIHS  --   --   --  <2  --     Estimated Creatinine Clearance: 73.2 mL/min (by C-G formula based on SCr of 0.77 mg/dL).   Medical History: Past Medical History:  Diagnosis Date  . Arthritis    Back  . Depression    no medication  . Dyspnea    with exertion    Assessment: Pt is a 48 yoF presenting with complaints of knee/calf pain and swelling and SOB. Pt is s/p right knee arthroscopy for partial meniscectomy several months ago. Reports recent admission to [facility] with rectal bleeding. No anticoagulants PTA.  5/31 Venous doppler: Acute RLE DVT 5/31 CTA: Scattered PE particularly on right side, no evidence right heart strain  Pharmacy consulted to dose/monitor heparin for VTE treatment.  08/05/2020  HL = 0.33 is therapeutic on heparin infusion of 1200 units/hr  CBC: Hgb slightly low but stable; Plt WNL  Confirmed with RN that heparin infusing at correct rate. No signs of bleeding. No interruptions.  Goal of Therapy:  Heparin level 0.3-0.7  units/ml Monitor platelets by anticoagulation protocol   Plan:   Continue heparin infusion at current rate of 1200 units/hr  Check confirmatory HL in 6 hours  HL, CBC daily while on heparin infusion  Monitor for signs of bleeding  Lenis Noon, PharmD 08/05/20 12:36 PM

## 2020-08-05 NOTE — Hospital Course (Signed)
RAMANDA PAULES is a 60 y.o. female with history of depression who has been having pain in the right knee since recent surgery has been having arthrocentesis by patient's orthopedist and last 1 was about a month ago has been having increasing pain in the right knee and in addition patient also complained of right calf pain.  Over the last 2 weeks the pain has been worsening along with patient having mild shortness of breath.  Denies any fever chills.   Patient states 3 weeks ago she was admitted at [facility] for rectal bleeding and was told that she had colitis and has outpatient follow-up with Dr. [name] of gastroenterology.   ED Course: In the ER x-rays revealed mild effusion in the right knee.  Dopplers done shows DVT of the right lower extremity and CT angiogram shows pulm embolism.  No strain pattern.  Given that patient had recent rectal bleeding admitted for further observation in the setting of patient being started on anticoagulation.  COVID test was negative.

## 2020-08-06 ENCOUNTER — Encounter (HOSPITAL_COMMUNITY): Payer: Self-pay | Admitting: Internal Medicine

## 2020-08-06 ENCOUNTER — Inpatient Hospital Stay (HOSPITAL_COMMUNITY): Payer: Medicare Other

## 2020-08-06 DIAGNOSIS — R079 Chest pain, unspecified: Secondary | ICD-10-CM | POA: Diagnosis not present

## 2020-08-06 DIAGNOSIS — D6489 Other specified anemias: Secondary | ICD-10-CM | POA: Diagnosis not present

## 2020-08-06 DIAGNOSIS — I82431 Acute embolism and thrombosis of right popliteal vein: Secondary | ICD-10-CM | POA: Diagnosis not present

## 2020-08-06 DIAGNOSIS — I82451 Acute embolism and thrombosis of right peroneal vein: Secondary | ICD-10-CM | POA: Diagnosis not present

## 2020-08-06 DIAGNOSIS — J9811 Atelectasis: Secondary | ICD-10-CM | POA: Diagnosis not present

## 2020-08-06 DIAGNOSIS — F32A Depression, unspecified: Secondary | ICD-10-CM | POA: Diagnosis not present

## 2020-08-06 DIAGNOSIS — G8929 Other chronic pain: Secondary | ICD-10-CM | POA: Diagnosis not present

## 2020-08-06 DIAGNOSIS — D72829 Elevated white blood cell count, unspecified: Secondary | ICD-10-CM | POA: Diagnosis not present

## 2020-08-06 DIAGNOSIS — Z79899 Other long term (current) drug therapy: Secondary | ICD-10-CM | POA: Diagnosis not present

## 2020-08-06 DIAGNOSIS — Z800 Family history of malignant neoplasm of digestive organs: Secondary | ICD-10-CM | POA: Diagnosis not present

## 2020-08-06 DIAGNOSIS — I2699 Other pulmonary embolism without acute cor pulmonale: Secondary | ICD-10-CM | POA: Diagnosis not present

## 2020-08-06 DIAGNOSIS — I824Z1 Acute embolism and thrombosis of unspecified deep veins of right distal lower extremity: Secondary | ICD-10-CM | POA: Diagnosis not present

## 2020-08-06 DIAGNOSIS — Z8616 Personal history of COVID-19: Secondary | ICD-10-CM | POA: Diagnosis not present

## 2020-08-06 DIAGNOSIS — I82441 Acute embolism and thrombosis of right tibial vein: Secondary | ICD-10-CM | POA: Diagnosis not present

## 2020-08-06 DIAGNOSIS — R519 Headache, unspecified: Secondary | ICD-10-CM | POA: Diagnosis not present

## 2020-08-06 DIAGNOSIS — M549 Dorsalgia, unspecified: Secondary | ICD-10-CM | POA: Diagnosis not present

## 2020-08-06 LAB — HEPARIN LEVEL (UNFRACTIONATED)
Heparin Unfractionated: 0.35 IU/mL (ref 0.30–0.70)
Heparin Unfractionated: 0.22 IU/mL — ABNORMAL LOW (ref 0.30–0.70)

## 2020-08-06 LAB — CBC
HCT: 36.6 % (ref 36.0–46.0)
Hemoglobin: 11.6 g/dL — ABNORMAL LOW (ref 12.0–15.0)
MCH: 26.5 pg (ref 26.0–34.0)
MCHC: 31.7 g/dL (ref 30.0–36.0)
MCV: 83.8 fL (ref 80.0–100.0)
Platelets: 307 10*3/uL (ref 150–400)
RBC: 4.37 MIL/uL (ref 3.87–5.11)
RDW: 15.2 % (ref 11.5–15.5)
WBC: 9.5 10*3/uL (ref 4.0–10.5)
nRBC: 0 % (ref 0.0–0.2)

## 2020-08-06 LAB — TROPONIN I (HIGH SENSITIVITY)
Troponin I (High Sensitivity): <2 ng/L (ref <18)
Troponin I (High Sensitivity): <2 ng/L (ref <18)

## 2020-08-06 MED ORDER — HEPARIN (PORCINE) 25000 UT/250ML-% IV SOLN
1200.0000 [IU]/h | INTRAVENOUS | Status: DC
  Administered 2020-08-06: 1200 [IU]/h via INTRAVENOUS
  Filled 2020-08-06: qty 250

## 2020-08-06 MED ORDER — OXYCODONE-ACETAMINOPHEN 5-325 MG PO TABS
1.0000 | ORAL_TABLET | Freq: Four times a day (QID) | ORAL | Status: DC
Start: 2020-08-06 — End: 2020-08-07
  Administered 2020-08-06 – 2020-08-07 (×4): 1 via ORAL
  Filled 2020-08-06 (×4): qty 1

## 2020-08-06 MED ORDER — HEPARIN (PORCINE) 25000 UT/250ML-% IV SOLN
1450.0000 [IU]/h | INTRAVENOUS | Status: DC
  Administered 2020-08-06: 1300 [IU]/h via INTRAVENOUS
  Filled 2020-08-06 (×2): qty 250

## 2020-08-06 NOTE — Plan of Care (Signed)

## 2020-08-06 NOTE — Progress Notes (Signed)
ANTICOAGULATION CONSULT NOTE - follow up  Pharmacy Consult for heparin Indication: pulmonary embolus, DVT  Allergies  Allergen Reactions  . Morphine Anaphylaxis, Hives, Shortness Of Breath and Rash  . Tramadol Anaphylaxis, Swelling and Rash    headache  . Gabapentin Rash and Swelling    Patient Measurements: Height: 5' 1"  (154.9 cm) Weight: 81.2 kg (179 lb 0.2 oz) IBW/kg (Calculated) : 47.8 Heparin Dosing Weight: 66 kg  Vital Signs: Temp: 98.6 F (37 C) (06/02 2006) Temp Source: Oral (06/02 2006) BP: 140/88 (06/02 2006) Pulse Rate: 78 (06/02 2006)  Labs: Recent Labs    08/04/20 1856 08/04/20 2211 08/05/20 0340 08/05/20 0349 08/05/20 1053 08/05/20 1653 08/06/20 0545 08/06/20 0756 08/06/20 1238 08/06/20 1357 08/06/20 1914  HGB 11.8*  --  11.1*  --   --   --  11.6*  --   --   --   --   HCT 37.3  --  35.5*  --   --   --  36.6  --   --   --   --   PLT 361  --  337  --   --   --  307  --   --   --   --   APTT  --  32  --   --   --   --   --   --   --   --   --   LABPROT  --  13.2  --   --   --   --   --   --   --   --   --   INR  --  1.0  --   --   --   --   --   --   --   --   --   HEPARINUNFRC  --   --  0.73*  --    < > 0.32  --  0.35  --   --  0.22*  CREATININE 0.84  --  0.77  --   --   --   --   --   --   --   --   TROPONINIHS  --   --   --  <2  --   --   --   --  <2 <2  --    < > = values in this interval not displayed.    Estimated Creatinine Clearance: 73.2 mL/min (by C-G formula based on SCr of 0.77 mg/dL).   Medical History: Past Medical History:  Diagnosis Date  . Arthritis    Back  . Depression    no medication  . Dyspnea    with exertion    Assessment: Pt is a 75 yoF presenting with complaints of knee/calf pain and swelling and SOB. Pt is s/p right knee arthroscopy for partial meniscectomy several months ago. Reports recent admission to [facility] with rectal bleeding. No anticoagulants PTA.  5/31 Venous doppler: Acute RLE DVT 5/31  CTA: Scattered PE particularly on right side, no evidence right heart strain  Pharmacy consulted to dose/monitor heparin for VTE treatment.  08/06/2020  Heparin drip was stopped earlier today d/t headache, then resume at 1239 at previously therapeutic rate of 1200 units/hr  Heparin level drawn ~ 7 hrs after drip resumed =  0.22 -slightly below goal  CBC: Hgb slightly low but stable; Plt WNL & stable  Confirmed with RN - no signs of bleeding   Goal of Therapy:  Heparin level  0.3-0.7 units/ml Monitor platelets by anticoagulation protocol   Plan:  Increase heparin drip to 1300 units/hr and check AM heparin level Daily CBC & heparin level while on heparin infusion  Eudelia Bunch, Pharm.D 08/06/2020 8:37 PM

## 2020-08-06 NOTE — Progress Notes (Addendum)
60 year old female with knee arthroscopy about 6 months ago by another provider  Never really recovered well from that surgery  Saw her in the office about 2 to 3 months ago and aspirated the knee which gave her some relief but it was short-term  Currently patient is hospitalized with DVT and pulmonary embolism.  On heparin drip at this time  On examination of the right knee she has mild effusion less than 20 cc with palpable pedal pulses and intact ankle dorsiflexion plantarflexion.  1+ pitting edema in that right lower extremity.  Most of her pain is behind the knee  I would not aspirate the knee at this time.  Probably as likely to cause some bleeding as remove fluid in terms of her being on IV heparin.  I  Have had better luck aspirating when the patients are on oral agents.  Okay for weightbearing as tolerated and knee CPM once medically cleared for those modalities in terms of the DVT.

## 2020-08-06 NOTE — Evaluation (Signed)
Physical Therapy Evaluation Patient Details Name: Brittney Carter MRN: [id number] DOB: 1960-07-15 Today's Date: 08/06/2020   History of Present Illness  Brittney Carter is a 60 y.o. female admitted 5/31 who has been having pain in the right knee since recent surgery.  Positive for DVT right LE and PE. Right LE spasms as well.  MD ordered head CT 6/2 as pt with incr headache since heparin initiated. PMH: depression  Clinical Impression  Pt admitted with above diagnosis. Pt was able to stand to RW but unable to take more than 2 steps due to right LE pain and spasms. Will progress pt as able.   Pt currently with functional limitations due to the deficits listed below (see PT Problem List). Pt will benefit from skilled PT to increase their independence and safety with mobility to allow discharge to the venue listed below.      Follow Up Recommendations SNF    Equipment Recommendations  Rolling walker with 5" wheels    Recommendations for Other Services       Precautions / Restrictions Precautions Precautions: Fall Restrictions Weight Bearing Restrictions: No      Mobility  Bed Mobility Overal bed mobility: Needs Assistance Bed Mobility: Supine to Sit     Supine to sit: Min assist     General bed mobility comments: assist to come to eOB with pt using her left LE under her right Le to move the LE to EOB.  Pt needed assist for trunk elevation as well.    Transfers Overall transfer level: Needs assistance Equipment used: Rolling walker (2 wheeled) Transfers: Sit to/from Stand Sit to Stand: Min assist         General transfer comment: Needed a little assist to power up.  Once up, pt c/o burning and spasms in right LE and was holding the right LE off floor for comfort.  Ambulation/Gait Ambulation/Gait assistance: Min assist Gait Distance (Feet): 2 Feet Assistive device: Rolling walker (2 wheeled) Gait Pattern/deviations: Step-to pattern;Decreased stance time -  right;Decreased weight shift to right;Antalgic;Trunk flexed   Gait velocity interpretation: <1.31 ft/sec, indicative of household ambulator General Gait Details: Pt needed cues and assist for taking 2 steps forward as pt could not weight bear on right LE due to pain. Had to hop backwards and needing assist to control descent back in bed. Pt not left in chair as she was going to CT soon.  Stairs            Wheelchair Mobility    Modified Rankin (Stroke Patients Only)       Balance Overall balance assessment: Needs assistance         Standing balance support: Bilateral upper extremity supported;During functional activity Standing balance-Leahy Scale: Poor Standing balance comment: relies on UE support                             Pertinent Vitals/Pain Pain Assessment: Faces Faces Pain Scale: Hurts even more Pain Location: right LE Pain Descriptors / Indicators: Aching;Grimacing;Guarding Pain Intervention(s): Limited activity within patient's tolerance;Monitored during session;Repositioned    Home Living Family/patient expects to be discharged to:: Private residence Living Arrangements: Alone Available Help at Discharge: Family;Available PRN/intermittently Type of Home: Mobile home Home Access: Stairs to enter Entrance Stairs-Rails: Right;Left;Can reach both Entrance Stairs-Number of Steps: 4 Home Layout: One level Home Equipment: Grab bars - tub/shower;Crutches      Prior Function Level of Independence: Independent  Comments: Pt was crawling or staying in bed about 2 1/2 weeks prior to coming in per pt     Hand Dominance        Extremity/Trunk Assessment   Upper Extremity Assessment Upper Extremity Assessment: Defer to OT evaluation    Lower Extremity Assessment Lower Extremity Assessment: RLE deficits/detail RLE: Unable to fully assess due to pain    Cervical / Trunk Assessment Cervical / Trunk Assessment: Normal   Communication   Communication: No difficulties  Cognition Arousal/Alertness: Awake/alert Behavior During Therapy: WFL for tasks assessed/performed Overall Cognitive Status: Within Functional Limits for tasks assessed                                        General Comments General comments (skin integrity, edema, etc.): right LE swollen    Exercises     Assessment/Plan    PT Assessment Patient needs continued PT services  PT Problem List Decreased balance;Decreased activity tolerance;Decreased mobility;Decreased safety awareness;Decreased knowledge of use of DME;Decreased knowledge of precautions;Pain       PT Treatment Interventions DME instruction;Gait training;Functional mobility training;Therapeutic activities;Therapeutic exercise;Balance training;Stair training;Patient/family education    PT Goals (Current goals can be found in the Care Plan section)  Acute Rehab PT Goals Patient Stated Goal: to go home PT Goal Formulation: With patient Time For Goal Achievement: 08/20/20 Potential to Achieve Goals: Good    Frequency Min 3X/week   Barriers to discharge Decreased caregiver support      Co-evaluation               AM-PAC PT "6 Clicks" Mobility  Outcome Measure Help needed turning from your back to your side while in a flat bed without using bedrails?: None Help needed moving from lying on your back to sitting on the side of a flat bed without using bedrails?: A Little Help needed moving to and from a bed to a chair (including a wheelchair)?: A Little Help needed standing up from a chair using your arms (e.g., wheelchair or bedside chair)?: A Little Help needed to walk in hospital room?: A Little Help needed climbing 3-5 steps with a railing? : A Little 6 Click Score: 19    End of Session Equipment Utilized During Treatment: Gait belt Activity Tolerance: Patient limited by fatigue;Patient limited by pain Patient left: with call bell/phone  within reach;in bed;with bed alarm set Nurse Communication: Mobility status PT Visit Diagnosis: Muscle weakness (generalized) (M62.81);Pain Pain - Right/Left: Right Pain - part of body: Leg    Time: 4650-3546 PT Time Calculation (min) (ACUTE ONLY): 19 min   Charges:   PT Evaluation $PT Eval Moderate Complexity: 1 Mod          Quinnlyn Hearns M,PT Acute Rehab Services [phone] (pager)  Brittney Carter 08/06/2020, 11:36 AM

## 2020-08-06 NOTE — Consult Note (Addendum)
Hospital Consult    Reason for Consult:  RLE dvt Referring Physician:  Dr. Arbutus Ped MRN #:  [id number]  History of Present Illness: This is a 60 y.o. female admitted with pain and swelling below her knee for approximately 2 weeks.  She had knee surgery in December states that she has had persistent pain since that time.  Over the last 2 weeks she has progressive pain she has been unable to walk.  She also has associated shortness of breath.  She does have a recent history of GI bleed.  Denies personal or family history of DVT.  He states that with her leg elevated in the hospital and on blood thinners she is feeling much better and denies any current swelling of her right leg.  She tells me that she has not had formal colonoscopy in 5 years at which time polyps were removed and she is also not up-to-date with mammography.  She does have one brother that died young from colon cancer.  Currently she is tolerating heparin drip without any further GI bleeding.  Past Medical History:  Diagnosis Date  . Arthritis    Back  . Depression    no medication  . Dyspnea    with exertion     Past Surgical History:  Procedure Laterality Date  . BACK SURGERY  2016   Laminectomy  . BACK SURGERY  1991   Disectomy  . BREAST SURGERY     Breast lift  . COLONOSCOPY W/ POLYPECTOMY     x 2  . Pain stimulator trail    . SPINAL CORD STIMULATOR INSERTION N/A 11/24/2016   Procedure: LUMBAR SPINAL CORD STIMULATOR INSERTION;  Surgeon: Melina Schools, MD;  Location: [facility];  Service: Orthopedics;  Laterality: N/A;  2.5 hrs  . SPINAL CORD STIMULATOR REMOVAL N/A 06/28/2017   Procedure: Spinal cord stimulator battery removal;  Surgeon: Melina Schools, MD;  Location: [place];  Service: Orthopedics;  Laterality: N/A;  . TONSILLECTOMY     age 73    Allergies  Allergen Reactions  . Morphine Anaphylaxis, Hives, Shortness Of Breath and Rash  . Tramadol Anaphylaxis, Swelling and Rash    headache  . Gabapentin Rash and  Swelling    Prior to Admission medications   Medication Sig Start Date End Date Taking? Authorizing Provider  Artificial Tear Solution (SYSTANE CONTACTS OP) Place 1 drop into both eyes daily as needed (dry eyes).   Yes [provider]  diclofenac Sodium (VOLTAREN) 1 % GEL Apply 2 g topically 4 (four) times daily as needed (right knee pain).   Yes [provider]  escitalopram (LEXAPRO) 10 MG tablet Take 15 mg by mouth daily. 06/30/20  Yes [provider]  naloxone (NARCAN) nasal spray 4 mg/0.1 mL Place 4 mg into the nose as needed (overdose). 04/04/19  Yes [provider]  traZODone (DESYREL) 50 MG tablet Take 50-100 mg by mouth at bedtime. 07/03/20  Yes [provider]    Social History   Socioeconomic History  . Marital status: Divorced    Spouse name: Not on file  . Number of children: Not on file  . Years of education: Not on file  . Highest education level: Not on file  Occupational History  . Not on file  Tobacco Use  . Smoking status: Never Smoker  . Smokeless tobacco: Never Used  Vaping Use  . Vaping Use: Never used  Substance and Sexual Activity  . Alcohol use: No  . Drug use: No  .  Sexual activity: Not on file  Other Topics Concern  . Not on file  Social History Narrative  . Not on file   Social Determinants of Health   Financial Resource Strain: Not on file  Food Insecurity: Not on file  Transportation Needs: Not on file  Physical Activity: Not on file  Stress: Not on file  Social Connections: Not on file  Intimate Partner Violence: Not on file    History reviewed. No pertinent family history.  ROS: Cardiovascular: []  chest pain/pressure []  palpitations []  SOB lying flat []  DOE [x]  pain in legs while walking [x]  pain in legs at rest []  pain in legs at night []  non-healing ulcers []  hx of DVT [x]  swelling in legs  Pulmonary: []  productive cough []  asthma/wheezing [x]  shortness of  breath  Neurologic: []  weakness in []  arms []  legs []  numbness in []  arms []  legs []  hx of CVA []  mini stroke [] difficulty speaking or slurred speech []  temporary loss of vision in one eye []  dizziness  Hematologic: []  hx of cancer []  bleeding problems []  problems with blood clotting easily  Endocrine:   []  diabetes []  thyroid disease  GI []  vomiting blood []  blood in stool  GU: []  CKD/renal failure []  HD--[]  M/W/F or []  T/T/S []  burning with urination []  blood in urine  Psychiatric: []  anxiety []  depression  Musculoskeletal: []  arthritis []  joint pain  Integumentary: []  rashes []  ulcers  Constitutional: []  fever []  chills   Physical Examination  Vitals:   08/06/20 1209 08/06/20 1446  BP: 110/82 127/72  Pulse: 73 81  Resp: 16 19  Temp: 98.7 F (37.1 C) 99.6 F (37.6 C)  SpO2: 100% 97%   Body mass index is 33.82 kg/m.  General:  nad HENT: WNL, normocephalic Pulmonary: Mildly labored breathing during prolonged conversation Cardiac: palpable pedal pulses bilaterally Abdomen: soft, NT/ND, no masses Extremities: No edema bilateral lower extremities Musculoskeletal: no muscle wasting or atrophy  Neurologic: A&O X 3   CBC    Component Value Date/Time   WBC 9.5 08/06/2020 0545   RBC 4.37 08/06/2020 0545   HGB 11.6 (L) 08/06/2020 0545   HCT 36.6 08/06/2020 0545   PLT 307 08/06/2020 0545   MCV 83.8 08/06/2020 0545   MCH 26.5 08/06/2020 0545   MCHC 31.7 08/06/2020 0545   RDW 15.2 08/06/2020 0545   LYMPHSABS 1.8 08/04/2020 1856   MONOABS 0.6 08/04/2020 1856   EOSABS 0.0 08/04/2020 1856   BASOSABS 0.1 08/04/2020 1856    BMET    Component Value Date/Time   NA 140 08/05/2020 0340   K 3.7 08/05/2020 0340   CL 104 08/05/2020 0340   CO2 31 08/05/2020 0340   GLUCOSE 105 (H) 08/05/2020 0340   BUN 13 08/05/2020 0340   CREATININE 0.77 08/05/2020 0340   CALCIUM 8.9 08/05/2020 0340   GFRNONAA >60 08/05/2020 0340   GFRAA >60 11/22/2016 1123     COAGS: Lab Results  Component Value Date   INR 1.0 08/04/2020     Non-Invasive Vascular Imaging:   Right lower extremity venous duplex Summary:  RIGHT:  - Findings consistent with acute deep vein thrombosis involving the right  popliteal vein, right posterior tibial veins, and right peroneal veins.  - No cystic structure found in the popliteal fossa.    LEFT:  - No evidence of common femoral vein obstruction.      ASSESSMENT/PLAN: This is a 60 y.o. female recent history of fairly extensive DVT from her popliteal extending into  her tibial veins on the right.  She has associated recent surgery that was now 6 months ago she has been less active since that time due to ongoing pain.  She has a recent GI bleed not up-to-date on colonoscopy or mammography.  She is currently tolerating heparin without any further bleeding.  I previously ordered IVC iliac duplex but I do not think this is necessary as patient currently is relatively symptom-free.  She should elevate her right lower extremity when she is recumbent and she can have gentle compression stockings since she is now anticoagulated.  If she does have an issue with anticoagulation she would need consideration of IVC filter.  She can see me on an as-needed basis.  Lavere Shinsky C. Donzetta Matters, MD [facility] Office: [phone] Pager: [phone]

## 2020-08-06 NOTE — Progress Notes (Addendum)
PROGRESS NOTE    Brittney Carter   ZOX:[id number]  DOB: 1960/04/16  PCP: [name], MD    DOA: 08/04/2020 LOS: 0   Brief Narrative   Brittney Carter is a 60 y.o. female with history of depression who has been having pain in the right knee since recent surgery has been having arthrocentesis by patient's orthopedist and last 1 was about a month ago has been having increasing pain in the right knee and in addition patient also complained of right calf pain.  Over the last 2 weeks the pain has been worsening along with patient having mild shortness of breath.  Denies any fever chills.   Patient states 3 weeks ago she was admitted at [facility] for rectal bleeding and was told that she had colitis and has outpatient follow-up with Dr. [name] of gastroenterology.   ED Course: In the ER x-rays revealed mild effusion in the right knee.  Dopplers done shows DVT of the right lower extremity and CT angiogram shows pulm embolism.  No strain pattern.  Given that patient had recent rectal bleeding admitted for further observation in the setting of patient being started on anticoagulation.  COVID test was negative.    Assessment & Plan   Principal Problem:   Acute pulmonary embolism (HCC) Active Problems:   Chronic pain   Acute pulmonary emboli Right lower extremity DVT Hemodynamically stable.  Likely provoked by immobility related to her recent right knee pain.  No sign of right heart strain on CTA chest. Echocardiogram showed LVEF of 81%, grade 1 diastolic dysfunction, no strain pattern. Patient having ongoing chest discomfort and right lower extremity pain, remains stable, on room air (6/2) --Continue on heparin infusion -- Monitor closely for bleeding, had recent rectal bleeding thought related to hemorrhoids requiring admission at [facility]. -- If tolerating anticoagulation without bleeding, likely transition to Eliquis in the next 24 to 48 hours -- Troponins negative --  Vascular surgery consulted  Right lower extremity spasms -suspect related to DVT.  Trial of Robaxin or oral magnesium.  Monitor electrolytes.  Expect improvement as clot burden clears.  Ambulatory dysfunction -due to above.   Initial PT recommendation is for SNF.  TOC consulted.  Headache - heparin held and CT head w/o contrast obtained - negative.  Heparin resumed.  Supportive care.  Monitor.  Recent rectal bleeding -reported admission at [facility].  Was treated with antibiotics for colitis.  Plans to follow-up with Dr. [name] of gastroenterology -- Monitor CBC and for signs of bleeding  History of depression -stable.  Continue Lexapro  Chronic anemia -stable.  Monitor CBC  Right knee pain with effusion -follows with Dr. Marlou Sa in orthopedics, consulted.  Has had a couple of outpatient aspirations.  Has been immobile due to knee pain.  Diastolic dysfunction, grade 1 -Per echo obtained this admission for evaluation of PE.  Appears euvolemic.  Monitor.  Patient BMI: Body mass index is 33.82 kg/m.   DVT prophylaxis:    Diet:  Diet Orders (From admission, onward)    Start     Ordered   08/04/20 2200  Diet Heart Room service appropriate? Yes; Fluid consistency: Thin  Diet effective now       Question Answer Comment  Room service appropriate? Yes   Fluid consistency: Thin      08/04/20 2200            Code Status: Full Code    Subjective 08/06/20    Patient seen this morning at  bedside.  Her nurse messaged me earlier this morning about patient having a new and worsening headache this morning.  Also patient was having increased pain and swelling of the right lower extremity.  We held heparin and got  a head CT that was normal.  Patient reassured.  Patient reports continuing to have chest discomfort much of the time.  Not worsening however.  No other acute complaints.  Physical therapist arrived to the room to work with patient who stated she was in too much pain to move  right now but was willing to try.   Disposition Plan & Communication   Status is: Inpatient  Remains inpatient appropriate because:IV treatments appropriate due to intensity of illness or inability to take PO   Dispo: The patient is from: Home              Anticipated d/c is to: Home              Patient currently is not medically stable to d/c.   Difficult to place patient No   Consults, Procedures, Significant Events   Consultants:   None  Procedures:   None  Antimicrobials:  Anti-infectives (From admission, onward)   None        Micro    Objective   Vitals:   08/05/20 1827 08/05/20 2210 08/06/20 0452 08/06/20 1209  BP: 114/69 107/67 120/79 110/82  Pulse: 88 72 66 73  Resp: 16 17 17 16   Temp: 98.3 F (36.8 C) 99.3 F (37.4 C) 98.1 F (36.7 C) 98.7 F (37.1 C)  TempSrc: Oral Oral Oral Oral  SpO2: 96% 95% 98% 100%  Weight:      Height:        Intake/Output Summary (Last 24 hours) at 08/06/2020 1351 Last data filed at 08/06/2020 1230 Gross per 24 hour  Intake 621.8 ml  Output 200 ml  Net 421.8 ml   Filed Weights   08/05/20 1019  Weight: 81.2 kg    Physical Exam:  General exam: awake, alert, no acute distress Respiratory system: CTAB, normal respiratory effort, on room air. Cardiovascular system: normal S1/S2, RRR   Central nervous system: A&O x3. no gross focal neurologic deficits, normal speech Extremities: Right calf tender on palpation, right knee with effusion appears stable, no warmth or erythema of the extremity, no edema Psychiatry: normal mood, congruent affect, judgement and insight appear normal  Labs   Data Reviewed: I have personally reviewed following labs and imaging studies  CBC: Recent Labs  Lab 08/04/20 1856 08/05/20 0340 08/06/20 0545  WBC 8.0 8.6 9.5  NEUTROABS 5.4  --   --   HGB 11.8* 11.1* 11.6*  HCT 37.3 35.5* 36.6  MCV 83.6 84.5 83.8  PLT 361 337 701   Basic Metabolic Panel: Recent Labs  Lab 08/04/20 1856  08/05/20 0340  NA 140 140  K 3.8 3.7  CL 104 104  CO2 28 31  GLUCOSE 108* 105*  BUN 10 13  CREATININE 0.84 0.77  CALCIUM 9.8 8.9   GFR: Estimated Creatinine Clearance: 73.2 mL/min (by C-G formula based on SCr of 0.77 mg/dL). Liver Function Tests: No results for input(s): AST, ALT, ALKPHOS, BILITOT, PROT, ALBUMIN in the last 168 hours. No results for input(s): LIPASE, AMYLASE in the last 168 hours. No results for input(s): AMMONIA in the last 168 hours. Coagulation Profile: Recent Labs  Lab 08/04/20 2211  INR 1.0   Cardiac Enzymes: No results for input(s): CKTOTAL, CKMB, CKMBINDEX, TROPONINI in the last 168  hours. BNP (last 3 results) No results for input(s): PROBNP in the last 8760 hours. HbA1C: No results for input(s): HGBA1C in the last 72 hours. CBG: No results for input(s): GLUCAP in the last 168 hours. Lipid Profile: No results for input(s): CHOL, HDL, LDLCALC, TRIG, CHOLHDL, LDLDIRECT in the last 72 hours. Thyroid Function Tests: No results for input(s): TSH, T4TOTAL, FREET4, T3FREE, THYROIDAB in the last 72 hours. Anemia Panel: No results for input(s): VITAMINB12, FOLATE, FERRITIN, TIBC, IRON, RETICCTPCT in the last 72 hours. Sepsis Labs: No results for input(s): PROCALCITON, LATICACIDVEN in the last 168 hours.  Recent Results (from the past 240 hour(s))  Resp Panel by RT-PCR (Flu A&B, Covid) Nasopharyngeal Swab     Status: None   Collection Time: 08/04/20  8:30 PM   Specimen: Nasopharyngeal Swab; Nasopharyngeal(NP) swabs in vial transport medium  Result Value Ref Range Status   SARS Coronavirus 2 by RT PCR NEGATIVE NEGATIVE Final    Comment: (NOTE) SARS-CoV-2 target nucleic acids are NOT DETECTED.  The SARS-CoV-2 RNA is generally detectable in upper respiratory specimens during the acute phase of infection. The lowest concentration of SARS-CoV-2 viral copies this assay can detect is 138 copies/mL. A negative result does not preclude SARS-Cov-2 infection  and should not be used as the sole basis for treatment or other patient management decisions. A negative result may occur with  improper specimen collection/handling, submission of specimen other than nasopharyngeal swab, presence of viral mutation(s) within the areas targeted by this assay, and inadequate number of viral copies(<138 copies/mL). A negative result must be combined with clinical observations, patient history, and epidemiological information. The expected result is Negative.  Fact Sheet for Patients:  EntrepreneurPulse.com.au  Fact Sheet for Healthcare Providers:  IncredibleEmployment.be  This test is no t yet approved or cleared by the [place] FDA and  has been authorized for detection and/or diagnosis of SARS-CoV-2 by FDA under an Emergency Use Authorization (EUA). This EUA will remain  in effect (meaning this test can be used) for the duration of the COVID-19 declaration under Section 564(b)(1) of the Act, 21 U.S.C.section 360bbb-3(b)(1), unless the authorization is terminated  or revoked sooner.       Influenza A by PCR NEGATIVE NEGATIVE Final   Influenza B by PCR NEGATIVE NEGATIVE Final    Comment: (NOTE) The Xpert Xpress SARS-CoV-2/FLU/RSV plus assay is intended as an aid in the diagnosis of influenza from Nasopharyngeal swab specimens and should not be used as a sole basis for treatment. Nasal washings and aspirates are unacceptable for Xpert Xpress SARS-CoV-2/FLU/RSV testing.  Fact Sheet for Patients: EntrepreneurPulse.com.au  Fact Sheet for Healthcare Providers: IncredibleEmployment.be  This test is not yet approved or cleared by the [place] FDA and has been authorized for detection and/or diagnosis of SARS-CoV-2 by FDA under an Emergency Use Authorization (EUA). This EUA will remain in effect (meaning this test can be used) for the duration of the COVID-19 declaration  under Section 564(b)(1) of the Act, 21 U.S.C. section 360bbb-3(b)(1), unless the authorization is terminated or revoked.  Performed at [facility], [place] [address], [place], [place] [address]       Imaging Studies   CT HEAD WO CONTRAST  Result Date: 08/06/2020 CLINICAL DATA:  Headache. EXAM: CT HEAD WITHOUT CONTRAST TECHNIQUE: Contiguous axial images were obtained from the base of the skull through the vertex without intravenous contrast. COMPARISON:  None. FINDINGS: Brain: No evidence of acute infarction, hemorrhage, hydrocephalus, extra-axial collection or mass lesion/mass effect. Vascular: No  hyperdense vessel or unexpected calcification. Skull: Normal. Negative for fracture or focal lesion. Sinuses/Orbits: No acute finding. Other: None. IMPRESSION: No acute intracranial abnormality seen. Electronically Signed   By: Marijo Conception M.D.   On: 08/06/2020 11:40   CT Angio Chest PE W and/or Wo Contrast  Result Date: 08/04/2020 CLINICAL DATA:  Right lower extremity deep venous thrombosis with shortness of breath and chest pain EXAM: CT ANGIOGRAPHY CHEST WITH CONTRAST TECHNIQUE: Multidetector CT imaging of the chest was performed using the standard protocol during bolus administration of intravenous contrast. Multiplanar CT image reconstructions and MIPs were obtained to evaluate the vascular anatomy. CONTRAST:  148m OMNIPAQUE IOHEXOL 350 MG/ML SOLN COMPARISON:  01/04/2020 FINDINGS: Cardiovascular: Thoracic aorta and its branches are within normal limits. No cardiac enlargement is seen. No coronary calcifications are noted. The pulmonary artery shows a normal branching pattern. A few filling defects are identified within the right pulmonary artery consistent with small pulmonary emboli. No right heart strain is noted. Mediastinum/Nodes: Thoracic inlet is within normal limits. No sizable hilar or mediastinal adenopathy is noted. The esophagus as visualized is within normal limits.  Lungs/Pleura: Lungs are clear. No pleural effusion or pneumothorax. Upper Abdomen: Visualized upper abdomen is unremarkable. Musculoskeletal: Degenerative changes of the thoracic spine are noted. Review of the MIP images confirms the above findings. IMPRESSION: Scattered pulmonary emboli particularly on the right without evidence of right heart strain. No other focal abnormality is noted. Critical Value/emergent results were called by telephone at the time of interpretation on 08/04/2020 at 8:19 pm to Dr. MOctaviano Glow, who verbally acknowledged these results. Electronically Signed   By: MInez CatalinaM.D.   On: 08/04/2020 20:23   DG Knee Complete 4 Views Right  Result Date: 08/04/2020 CLINICAL DATA:  Right knee pain and swelling. EXAM: RIGHT KNEE - COMPLETE 4+ VIEW COMPARISON:  February 02, 2020. FINDINGS: No evidence of fracture or dislocation. Small suprapatellar joint effusion may be present. No evidence of arthropathy or other focal bone abnormality. Soft tissues are unremarkable. IMPRESSION: Probable small suprapatellar joint effusion. No fracture or dislocation is noted. Electronically Signed   By: JMarijo ConceptionM.D.   On: 08/04/2020 16:26   ECHOCARDIOGRAM COMPLETE  Result Date: 08/05/2020    ECHOCARDIOGRAM REPORT   Patient Name:   Brittney SAKAMOTODate of Exam: 08/05/2020 Medical Rec #:  [id number]        Height:       61.0 in Accession #:    [id number]       Weight:       179.0 lb Date of Birth:  [birth year]        BSA:          1.802 m Patient Age:    89years          BP:           111/64 mmHg Patient Gender: F                 HR:           67 bpm. Exam Location:  Inpatient Procedure: 2D Echo, 3D Echo, Cardiac Doppler, Color Doppler and Strain Analysis Indications:    Chest Pain R07.9  History:        Patient has no prior history of Echocardiogram examinations.                 Signs/Symptoms:Shortness of Breath. Recent surgery of the right  knee. DVT. Pulmonary embolism.   Sonographer:    Darlina Sicilian RDCS Referring Phys: [place]  1. Left ventricular ejection fraction by 3D volume is 59 %. The left ventricle has normal function. The left ventricle has no regional wall motion abnormalities. Left ventricular diastolic parameters are consistent with Grade I diastolic dysfunction (impaired relaxation). The average left ventricular global longitudinal strain is -22.2 %. The global longitudinal strain is normal.  2. Right ventricular systolic function is normal. The right ventricular size is normal. Tricuspid regurgitation signal is inadequate for assessing PA pressure.  3. The mitral valve is normal in structure. Trivial mitral valve regurgitation. No evidence of mitral stenosis.  4. The aortic valve is grossly normal. Aortic valve regurgitation is not visualized. No aortic stenosis is present.  5. The inferior vena cava is normal in size with greater than 50% respiratory variability, suggesting right atrial pressure of 3 mmHg. FINDINGS  Left Ventricle: Left ventricular ejection fraction by 3D volume is 59 %. The left ventricle has normal function. The left ventricle has no regional wall motion abnormalities. The average left ventricular global longitudinal strain is -22.2 %. The global  longitudinal strain is normal. The left ventricular internal cavity size was normal in size. There is no left ventricular hypertrophy. Left ventricular diastolic parameters are consistent with Grade I diastolic dysfunction (impaired relaxation). Right Ventricle: The right ventricular size is normal. No increase in right ventricular wall thickness. Right ventricular systolic function is normal. Tricuspid regurgitation signal is inadequate for assessing PA pressure. Left Atrium: Left atrial size was normal in size. Right Atrium: Right atrial size was normal in size. Pericardium: There is no evidence of pericardial effusion. Mitral Valve: The mitral valve is normal in structure.  Trivial mitral valve regurgitation. No evidence of mitral valve stenosis. Tricuspid Valve: The tricuspid valve is normal in structure. Tricuspid valve regurgitation is not demonstrated. No evidence of tricuspid stenosis. Aortic Valve: The aortic valve is grossly normal. Aortic valve regurgitation is not visualized. No aortic stenosis is present. Pulmonic Valve: The pulmonic valve was not well visualized. Pulmonic valve regurgitation is not visualized. No evidence of pulmonic stenosis. Aorta: The aortic root is normal in size and structure. Venous: The inferior vena cava is normal in size with greater than 50% respiratory variability, suggesting right atrial pressure of 3 mmHg. IAS/Shunts: No atrial level shunt detected by color flow Doppler.  LEFT VENTRICLE PLAX 2D LVIDd:         4.90 cm         Diastology LVIDs:         3.90 cm         LV e' medial:    5.77 cm/s LV PW:         0.90 cm         LV E/e' medial:  9.6 LV IVS:        1.00 cm         LV e' lateral:   8.05 cm/s LVOT diam:     1.80 cm         LV E/e' lateral: 6.9 LV SV:         48 LV SV Index:   27              2D LVOT Area:     2.54 cm        Longitudinal  Strain                                2D Strain GLS  -22.2 %                                Avg:                                 3D Volume EF                                LV 3D EF:    Left                                             ventricular                                             ejection                                             fraction by                                             3D volume                                             is 59 %.                                 3D Volume EF:                                3D EF:        59 %                                LV EDV:       114 ml                                LV ESV:       47 ml                                LV SV:        67 ml RIGHT VENTRICLE RV S prime:     13.30 cm/s TAPSE (M-mode): 2.0 cm LEFT  ATRIUM  Index       RIGHT ATRIUM          Index LA diam:        2.90 cm 1.61 cm/m  RA Area:     8.95 cm LA Vol (A2C):   27.9 ml 15.48 ml/m RA Volume:   14.90 ml 8.27 ml/m LA Vol (A4C):   29.4 ml 16.32 ml/m LA Biplane Vol: 28.6 ml 15.87 ml/m  AORTIC VALVE LVOT Vmax:   92.50 cm/s LVOT Vmean:  59.100 cm/s LVOT VTI:    0.190 m  AORTA Ao Root diam: 3.20 cm Ao Asc diam:  2.90 cm MITRAL VALVE MV Area (PHT): 2.62 cm    SHUNTS MV Decel Time: 290 msec    Systemic VTI:  0.19 m MV E velocity: 55.30 cm/s  Systemic Diam: 1.80 cm MV A velocity: 71.60 cm/s MV E/A ratio:  0.77 Cherlynn Kaiser MD Electronically signed by Cherlynn Kaiser MD Signature Date/Time: 08/05/2020/4:28:25 PM    Final    VAS [place] LOWER EXTREMITY VENOUS (DVT) (ONLY [facility] & [facility] 7a-7p)  Result Date: 08/04/2020  Lower Venous DVT Study Patient Name:  Brittney Carter  Date of Exam:   08/04/2020 Medical Rec #: [id number]          Accession #:    [id number] Date of Birth: 1960-06-27          Patient Gender: F Patient Age:   75Y Exam Location:  [facility] Procedure:      VAS [place] LOWER EXTREMITY VENOUS (DVT) Referring Phys: [id number] HINA KHATRI --------------------------------------------------------------------------------  Indications: Swelling.  Risk Factors: None identified. Comparison Study: No prior studies. Performing Technologist: Oliver Hum RVT  Examination Guidelines: A complete evaluation includes B-mode imaging, spectral Doppler, color Doppler, and power Doppler as needed of all accessible portions of each vessel. Bilateral testing is considered an integral part of a complete examination. Limited examinations for reoccurring indications may be performed as noted. The reflux portion of the exam is performed with the patient in reverse Trendelenburg.  +---------+---------------+---------+-----------+----------+--------------+ RIGHT    CompressibilityPhasicitySpontaneityPropertiesThrombus Aging  +---------+---------------+---------+-----------+----------+--------------+ CFV      Full           Yes      Yes                                 +---------+---------------+---------+-----------+----------+--------------+ SFJ      Full                                                        +---------+---------------+---------+-----------+----------+--------------+ FV Prox  Full                                                        +---------+---------------+---------+-----------+----------+--------------+ FV Mid   Full                                                        +---------+---------------+---------+-----------+----------+--------------+ FV DistalFull                                                        +---------+---------------+---------+-----------+----------+--------------+  PFV      Full                                                        +---------+---------------+---------+-----------+----------+--------------+ POP      None           No       No                   Acute          +---------+---------------+---------+-----------+----------+--------------+ PTV      Partial                                      Acute          +---------+---------------+---------+-----------+----------+--------------+ PERO     Partial                                      Acute          +---------+---------------+---------+-----------+----------+--------------+   +----+---------------+---------+-----------+----------+--------------+ LEFTCompressibilityPhasicitySpontaneityPropertiesThrombus Aging +----+---------------+---------+-----------+----------+--------------+ CFV Full           Yes      Yes                                 +----+---------------+---------+-----------+----------+--------------+     Summary: RIGHT: - Findings consistent with acute deep vein thrombosis involving the right popliteal vein, right posterior tibial veins, and  right peroneal veins. - No cystic structure found in the popliteal fossa.  LEFT: - No evidence of common femoral vein obstruction.  *See table(s) above for measurements and observations. Electronically signed by Deitra Mayo MD on 08/04/2020 at 6:43:46 PM.    Final      Medications   Scheduled Meds: . escitalopram  15 mg Oral Daily  . oxyCODONE-acetaminophen  1 tablet Oral Q6H  . traZODone  50-100 mg Oral QHS   Continuous Infusions: . heparin 1,200 Units/hr (08/06/20 1239)       LOS: 0 days    Time spent: 30 minutes with . 50% spent at bedside and in coordination of care.    Ezekiel Slocumb, DO [facility]  08/06/2020, 1:51 PM      If 7PM-7AM, please contact night-coverage. How to contact the [facility] Attending or Consulting provider [place] or covering provider during after hours [place], for this patient?    1. Check the care team in [facility] and look for a) attending/consulting [facility] provider listed and b) the [facility] team listed 2. Log into [email or web address] and use [place]'s universal password to access. If you do not have the password, please contact the hospital operator. 3. Locate the [facility] provider you are looking for under [facility] and page to a number that you can be directly reached. 4. If you still have difficulty reaching the provider, please page the [facility] (Director on Call) for the Hospitalists listed on amion for assistance.

## 2020-08-06 NOTE — Progress Notes (Signed)
I received a consult for Brittney Carter who requested prayer.  I provided listening presence as she shared about her current health situation and her family relationships.  I provided prayer and ministry of presence as well.  Chaplain Janne Napoleon, [place] PAger, [phone]

## 2020-08-06 NOTE — Progress Notes (Addendum)
ANTICOAGULATION CONSULT NOTE - follow up  Pharmacy Consult for heparin Indication: pulmonary embolus, DVT  Allergies  Allergen Reactions  . Morphine Anaphylaxis, Hives, Shortness Of Breath and Rash  . Tramadol Anaphylaxis, Swelling and Rash    headache  . Gabapentin Rash and Swelling    Patient Measurements: Height: 5' 1"  (154.9 cm) Weight: 81.2 kg (179 lb 0.2 oz) IBW/kg (Calculated) : 47.8 Heparin Dosing Weight: 66 kg  Vital Signs: Temp: 98.1 F (36.7 C) (06/02 0452) Temp Source: Oral (06/02 0452) BP: 120/79 (06/02 0452) Pulse Rate: 66 (06/02 0452)  Labs: Recent Labs    08/04/20 1856 08/04/20 1856 08/04/20 2211 08/05/20 0340 08/05/20 0349 08/05/20 1053 08/05/20 1653 08/06/20 0545 08/06/20 0756  HGB 11.8*  --   --  11.1*  --   --   --  11.6*  --   HCT 37.3  --   --  35.5*  --   --   --  36.6  --   PLT 361  --   --  337  --   --   --  307  --   APTT  --   --  32  --   --   --   --   --   --   LABPROT  --   --  13.2  --   --   --   --   --   --   INR  --   --  1.0  --   --   --   --   --   --   HEPARINUNFRC  --    < >  --  0.73*  --  0.33 0.32  --  0.35  CREATININE 0.84  --   --  0.77  --   --   --   --   --   TROPONINIHS  --   --   --   --  <2  --   --   --   --    < > = values in this interval not displayed.    Estimated Creatinine Clearance: 73.2 mL/min (by C-G formula based on SCr of 0.77 mg/dL).   Medical History: Past Medical History:  Diagnosis Date  . Arthritis    Back  . Depression    no medication  . Dyspnea    with exertion    Assessment: Pt is a 11 yoF presenting with complaints of knee/calf pain and swelling and SOB. Pt is s/p right knee arthroscopy for partial meniscectomy several months ago. Reports recent admission to [facility] with rectal bleeding. No anticoagulants PTA.  5/31 Venous doppler: Acute RLE DVT 5/31 CTA: Scattered PE particularly on right side, no evidence right heart strain  Pharmacy consulted to dose/monitor  heparin for VTE treatment.  08/06/2020  HL = 0.35 remains therapeutic on heparin infusion of 1200 units/hr  CBC: Hgb slightly low but stable; Plt WNL & stable  Confirmed with RN - no signs of bleeding  Patient complaining of headache. Heparin drip on hold per MD and CT ordered.   Heparin level obtained @ 0756, heparin drip turned off @ 0801.   Goal of Therapy:  Heparin level 0.3-0.7 units/ml Monitor platelets by anticoagulation protocol   Plan:   Discontinue heparin drip order at this time  CT ordered  Await guidance from MD on if/when safe to resume heparin drip.   Lenis Noon, PharmD 08/06/20 10:03 AM  Addendum: Received verbal order from MD ok  to resume heparin. Will resume at previous rate of 1200 units/hr.  Lenis Noon, PharmD 08/06/20 12:20 PM

## 2020-08-06 NOTE — Telephone Encounter (Signed)
I called her.  She was splinted, and check out her knee.  I think would be potentially advised aspirate the knee unless she has a massive effusion.  Especially if she is on IV blood thinners.  We will see what it looks like on exam there in the day today

## 2020-08-07 ENCOUNTER — Inpatient Hospital Stay (HOSPITAL_COMMUNITY): Payer: Medicare Other

## 2020-08-07 ENCOUNTER — Encounter (HOSPITAL_COMMUNITY): Payer: Medicare Other

## 2020-08-07 ENCOUNTER — Ambulatory Visit: Payer: Medicare Other | Admitting: Surgical

## 2020-08-07 LAB — CBC
HCT: 35.8 % — ABNORMAL LOW (ref 36.0–46.0)
Hemoglobin: 11.4 g/dL — ABNORMAL LOW (ref 12.0–15.0)
MCH: 26.8 pg (ref 26.0–34.0)
MCHC: 31.8 g/dL (ref 30.0–36.0)
MCV: 84.2 fL (ref 80.0–100.0)
Platelets: 365 10*3/uL (ref 150–400)
RBC: 4.25 MIL/uL (ref 3.87–5.11)
RDW: 15.3 % (ref 11.5–15.5)
WBC: 12.1 10*3/uL — ABNORMAL HIGH (ref 4.0–10.5)
nRBC: 0 % (ref 0.0–0.2)

## 2020-08-07 LAB — TROPONIN I (HIGH SENSITIVITY): Troponin I (High Sensitivity): <2 ng/L (ref <18)

## 2020-08-07 LAB — HEPARIN LEVEL (UNFRACTIONATED)
Heparin Unfractionated: 0.25 IU/mL — ABNORMAL LOW (ref 0.30–0.70)
Heparin Unfractionated: 0.72 IU/mL — ABNORMAL HIGH (ref 0.30–0.70)

## 2020-08-07 MED ORDER — HYDROMORPHONE HCL 1 MG/ML IJ SOLN
0.5000 mg | INTRAMUSCULAR | Status: DC | PRN
  Administered 2020-08-07 – 2020-08-09 (×9): 0.5 mg via INTRAVENOUS
  Filled 2020-08-07 (×9): qty 0.5

## 2020-08-07 MED ORDER — APIXABAN 5 MG PO TABS: 5.0000 mg | ORAL_TABLET | Freq: Two times a day (BID) | ORAL | Status: DC

## 2020-08-07 MED ORDER — OXYCODONE HCL 5 MG PO TABS
10.0000 mg | ORAL_TABLET | Freq: Four times a day (QID) | ORAL | Status: DC
  Administered 2020-08-07 – 2020-08-08 (×4): 10 mg via ORAL
  Filled 2020-08-07 (×4): qty 2

## 2020-08-07 MED ORDER — ACETAMINOPHEN 325 MG PO TABS
650.0000 mg | ORAL_TABLET | Freq: Four times a day (QID) | ORAL | Status: DC
  Administered 2020-08-07 – 2020-08-08 (×4): 650 mg via ORAL
  Filled 2020-08-07 (×4): qty 2

## 2020-08-07 MED ORDER — APIXABAN 5 MG PO TABS
10.0000 mg | ORAL_TABLET | Freq: Two times a day (BID) | ORAL | Status: DC
  Administered 2020-08-07: 10 mg via ORAL
  Filled 2020-08-07: qty 2

## 2020-08-07 MED ORDER — ALPRAZOLAM 0.25 MG PO TABS: 0.2500 mg | ORAL_TABLET | Freq: Three times a day (TID) | ORAL | Status: DC | PRN

## 2020-08-07 MED ORDER — ACETAMINOPHEN 650 MG RE SUPP: 650.0000 mg | Freq: Four times a day (QID) | RECTAL | Status: DC

## 2020-08-07 MED ORDER — HEPARIN (PORCINE) 25000 UT/250ML-% IV SOLN
1200.0000 [IU]/h | INTRAVENOUS | Status: DC
  Administered 2020-08-07: 1200 [IU]/h via INTRAVENOUS
  Filled 2020-08-07 (×2): qty 250

## 2020-08-07 NOTE — Plan of Care (Signed)

## 2020-08-07 NOTE — Evaluation (Signed)
Occupational Therapy Evaluation Patient Details Name: Brittney Carter MRN: [id number] DOB: [birth year] Today's Date: 08/07/2020    History of Present Illness Brittney Carter is a 60 y.o. female admitted 5/31 who has been having pain in the right knee since recent surgery.  Positive for DVT right LE and PE. Right LE spasms as well.  MD ordered head CT 6/2 as pt with incr headache since heparin initiated. PMH: depression   Clinical Impression   Pt admitted with the above diagnoses and presents with below problem list. Pt will benefit from continued acute OT to address the below listed deficits and maximize independence with basic ADLs prior to d/c home. At baseline, pt is independent with ADLs. She does endorse struggling significantly with ADLs in the 2 weeks leading up to this admission. Pt currently needs min guard-min A with LB ADLs, min guard assist to walk bathroom distances and complete toilet transfers. Utilized rw this session. Pt up to recliner at end of session.     Follow Up Recommendations  Home health OT;Supervision - Intermittent    Equipment Recommendations  3 in 1 bedside commode    Recommendations for Other Services       Precautions / Restrictions Precautions Precautions: Fall Restrictions Weight Bearing Restrictions: No      Mobility Bed Mobility Overal bed mobility: Needs Assistance Bed Mobility: Supine to Sit     Supine to sit: Min guard;HOB elevated;Min assist     General bed mobility comments: Pt able to advance BLE across bed and onto floor. steady assist during trunk elevation. Pt able to pivot hips to full EOB with no physical assist    Transfers Overall transfer level: Needs assistance Equipment used: Rolling walker (2 wheeled) Transfers: Sit to/from Stand Sit to Stand: Min guard         General transfer comment: min guard for safety. from recliner, to/from comfort height toilet, to recliner.    Balance Overall balance assessment: Needs  assistance         Standing balance support: Bilateral upper extremity supported;During functional activity Standing balance-Leahy Scale: Poor Standing balance comment: relies on UE support                           ADL either performed or assessed with clinical judgement   ADL Overall ADL's : Needs assistance/impaired Eating/Feeding: Set up;Sitting   Grooming: Set up;Sitting   Upper Body Bathing: Set up;Sitting   Lower Body Bathing: Minimal assistance;Sit to/from stand;Sitting/lateral leans;Min guard   Upper Body Dressing : Set up;Sitting   Lower Body Dressing: Min guard;Minimal assistance;Sitting/lateral leans;Sit to/from stand   Toilet Transfer: Min guard;Ambulation;RW;Comfort height toilet;Grab bars   Toileting- Clothing Manipulation and Hygiene: Min guard;Sitting/lateral lean;Sit to/from stand   Tub/ Shower Transfer: Tub transfer;Minimal assistance;Ambulation;3 in 1;Rolling walker Tub/Shower Transfer Details (indicate cue type and reason): per clinical judgement Functional mobility during ADLs: Min guard;Rolling walker General ADL Comments: Pt completed bed mobility, walked to/from bathroom, toilet transfer, and pericare as detailed above. Up to recliner at end of session.     Vision Baseline Vision/History: Wears glasses       Perception     Praxis      Pertinent Vitals/Pain Pain Assessment: Faces Faces Pain Scale: Hurts even more Pain Location: right LE Pain Descriptors / Indicators: Aching;Grimacing;Guarding Pain Intervention(s): Limited activity within patient's tolerance;Monitored during session;Repositioned     Hand Dominance     Extremity/Trunk Assessment Upper Extremity Assessment Upper Extremity  Assessment: Overall WFL for tasks assessed;Generalized weakness   Lower Extremity Assessment Lower Extremity Assessment: Defer to PT evaluation       Communication Communication Communication: No difficulties   Cognition  Arousal/Alertness: Awake/alert Behavior During Therapy: Anxious;WFL for tasks assessed/performed Overall Cognitive Status: Within Functional Limits for tasks assessed                                     General Comments       Exercises     Shoulder Instructions      Home Living Family/patient expects to be discharged to:: Private residence Living Arrangements: Alone Available Help at Discharge: Family;Available PRN/intermittently Type of Home: Mobile home Home Access: Stairs to enter Entrance Stairs-Number of Steps: 4 Entrance Stairs-Rails: Right;Left;Can reach both Home Layout: One level     Bathroom Shower/Tub: Teacher, early years/pre: Standard Bathroom Accessibility: Yes   Home Equipment: Grab bars - tub/shower;Crutches          Prior Functioning/Environment Level of Independence: Independent        Comments: Pt was crawling or staying in bed about 2 1/2 weeks prior to coming in per pt        OT Problem List:        OT Treatment/Interventions: Self-care/ADL training;DME and/or AE instruction;Therapeutic activities;Balance training;Patient/family education    OT Goals(Current goals can be found in the care plan section) Acute Rehab OT Goals Patient Stated Goal: reduce pain, return home OT Goal Formulation: With patient Time For Goal Achievement: 08/21/20 Potential to Achieve Goals: Good ADL Goals Pt Will Perform Lower Body Bathing: with modified independence;sit to/from stand Pt Will Perform Lower Body Dressing: with modified independence;sit to/from stand Pt Will Transfer to Toilet: with modified independence;ambulating Pt Will Perform Toileting - Clothing Manipulation and hygiene: with modified independence;sit to/from stand Pt Will Perform Tub/Shower Transfer: Tub transfer;ambulating;3 in 1;rolling walker;with supervision  OT Frequency: Min 2X/week   Barriers to D/C:            Co-evaluation              AM-PAC  OT "6 Clicks" Daily Activity     Outcome Measure Help from another person eating meals?: None Help from another person taking care of personal grooming?: None Help from another person toileting, which includes using toliet, bedpan, or urinal?: A Little Help from another person bathing (including washing, rinsing, drying)?: A Little Help from another person to put on and taking off regular upper body clothing?: None Help from another person to put on and taking off regular lower body clothing?: A Little 6 Click Score: 21   End of Session Equipment Utilized During Treatment: Rolling walker Nurse Communication: Other (comment) (pt c/o increased L chest pain, per nurse all tests done and ok to work with therapy)  Activity Tolerance: Patient tolerated treatment well Patient left: in chair;with call bell/phone within reach;with chair alarm set  OT Visit Diagnosis: Pain;Unsteadiness on feet (R26.81);Muscle weakness (generalized) (M62.81)                Time: 3762-8315 OT Time Calculation (min): 20 min Charges:  OT General Charges $OT Visit: 1 Visit OT Evaluation $OT Eval Low Complexity: [place], OT Acute Rehabilitation Services Pager: [phone] Office: [phone]   Hortencia Pilar 08/07/2020, 12:20 PM

## 2020-08-07 NOTE — Progress Notes (Signed)
OT Cancellation Note  Patient Details Name: Brittney Carter MRN: [id number] DOB: 03-Aug-1960   Cancelled Treatment:    Reason Eval/Treat Not Completed: Pain limiting ability to participate. Per pt, she has spoken with nursing regarding current pain and is due for her next pain med at [facility] to reattempt later this morning.   Tyrone Schimke, OT Acute Rehabilitation Services Pager: [phone] Office: [phone]  08/07/2020, 9:21 AM

## 2020-08-07 NOTE — Progress Notes (Signed)
ANTICOAGULATION CONSULT NOTE - follow up  Pharmacy Consult for heparin Indication: pulmonary embolus, DVT  Allergies  Allergen Reactions  . Morphine Anaphylaxis, Hives, Shortness Of Breath and Rash  . Tramadol Anaphylaxis, Swelling and Rash    headache  . Gabapentin Rash and Swelling    Patient Measurements: Height: 5' 1"  (154.9 cm) Weight: 81.2 kg (179 lb 0.2 oz) IBW/kg (Calculated) : 47.8 Heparin Dosing Weight: 66 kg  Vital Signs: Temp: 98.6 F (37 C) (06/02 2006) Temp Source: Oral (06/02 2006) BP: 140/88 (06/02 2006) Pulse Rate: 78 (06/02 2006)  Labs: Recent Labs    08/04/20 1856 08/04/20 2211 08/05/20 0340 08/05/20 0349 08/05/20 1053 08/06/20 0545 08/06/20 0756 08/06/20 1238 08/06/20 1357 08/06/20 1914 08/07/20 0515  HGB 11.8*  --  11.1*  --   --  11.6*  --   --   --   --  11.4*  HCT 37.3  --  35.5*  --   --  36.6  --   --   --   --  35.8*  PLT 361  --  337  --   --  307  --   --   --   --  365  APTT  --  32  --   --   --   --   --   --   --   --   --   LABPROT  --  13.2  --   --   --   --   --   --   --   --   --   INR  --  1.0  --   --   --   --   --   --   --   --   --   HEPARINUNFRC  --   --  0.73*  --    < >  --  0.35  --   --  0.22* 0.25*  CREATININE 0.84  --  0.77  --   --   --   --   --   --   --   --   TROPONINIHS  --   --   --  <2  --   --   --  <2 <2  --   --    < > = values in this interval not displayed.    Estimated Creatinine Clearance: 73.2 mL/min (by C-G formula based on SCr of 0.77 mg/dL).   Medical History: Past Medical History:  Diagnosis Date  . Arthritis    Back  . Depression    no medication  . Dyspnea    with exertion    Assessment: Pt is a 58 yoF presenting with complaints of knee/calf pain and swelling and SOB. Pt is s/p right knee arthroscopy for partial meniscectomy several months ago. Reports recent admission to [facility] with rectal bleeding. No anticoagulants PTA.  5/31 Venous doppler: Acute RLE DVT 5/31  CTA: Scattered PE particularly on right side, no evidence right heart strain  Pharmacy consulted to dose/monitor heparin for VTE treatment. Significant Events: 6/2: Heparin drip was stopped earlier today d/t headache, then resume at 1239 at previously therapeutic rate of 1200 units/hr  08/07/2020  HL  0.25 -slightly below goal on 1300 units/hr  CBC: Hgb slightly low but stable; Plt WNL & stable  Confirmed with RN - no signs of bleeding, no interruptions  Goal of Therapy:  Heparin level 0.3-0.7 units/ml Monitor platelets by anticoagulation protocol  Plan:  Increase heparin to 1450 units/hr Heparin level in 6 hours Daily CBC & heparin level while on heparin infusion  Dolly Rias RPh 08/07/2020, 6:01 AM

## 2020-08-07 NOTE — Discharge Instructions (Addendum)
Information on my medicine - ELIQUIS (apixaban)  This medication education was reviewed with me or my healthcare representative as part of my discharge preparation.  The pharmacist that spoke with me during my hospital stay was:  Napoleon Form [facility]  Why was Eliquis prescribed for you? Eliquis was prescribed to treat blood clots that may have been found in the veins of your legs (deep vein thrombosis) or in your lungs (pulmonary embolism) and to reduce the risk of them occurring again.  What do You need to know about Eliquis ? The starting dose is 10 mg (two 5 mg tablets) taken TWICE daily for the FIRST SEVEN (7) DAYS, then on 6/10  the dose is reduced to ONE 5 mg tablet taken TWICE daily.  Eliquis may be taken with or without food.   Try to take the dose about the same time in the morning and in the evening. If you have difficulty swallowing the tablet whole please discuss with your pharmacist how to take the medication safely.  Take Eliquis exactly as prescribed and DO NOT stop taking Eliquis without talking to the doctor who prescribed the medication.  Stopping may increase your risk of developing a new blood clot.  Refill your prescription before you run out.  After discharge, you should have regular check-up appointments with your healthcare provider that is prescribing your Eliquis.    What do you do if you miss a dose? If a dose of ELIQUIS is not taken at the scheduled time, take it as soon as possible on the same day and twice-daily administration should be resumed. The dose should not be doubled to make up for a missed dose.  Important Safety Information A possible side effect of Eliquis is bleeding. You should call your healthcare provider right away if you experience any of the following: ? Bleeding from an injury or your nose that does not stop. ? Unusual colored urine (red or dark brown) or unusual colored stools (red or black). ? Unusual bruising for unknown  reasons. ? A serious fall or if you hit your head (even if there is no bleeding).  Some medicines may interact with Eliquis and might increase your risk of bleeding or clotting while on Eliquis. To help avoid this, consult your healthcare provider or pharmacist prior to using any new prescription or non-prescription medications, including herbals, vitamins, non-steroidal anti-inflammatory drugs (NSAIDs) and supplements.  This website has more information on Eliquis (apixaban): http://[email or web address].com/eliquis/home

## 2020-08-07 NOTE — Progress Notes (Signed)
PROGRESS NOTE    Brittney Carter   IOM:[id number]  DOB: 08/16/1960  PCP: [name], MD    DOA: 08/04/2020 LOS: 1   Brief Narrative   Brittney Carter is a 60 y.o. female with history of depression who has been having pain in the right knee since recent surgery has been having arthrocentesis by patient's orthopedist and last 1 was about a month ago has been having increasing pain in the right knee and in addition patient also complained of right calf pain.  Over the last 2 weeks the pain has been worsening along with patient having mild shortness of breath.  Denies any fever chills.   Patient states 3 weeks ago she was admitted at [facility] for rectal bleeding and was told that she had colitis and has outpatient follow-up with Dr. [name] of gastroenterology.   ED Course: In the ER x-rays revealed mild effusion in the right knee.  Dopplers done shows DVT of the right lower extremity and CT angiogram shows pulm embolism.  No strain pattern.  Given that patient had recent rectal bleeding admitted for further observation in the setting of patient being started on anticoagulation.  COVID test was negative.    Assessment & Plan   Principal Problem:   Acute pulmonary embolism (HCC) Active Problems:   Chronic pain   Acute pulmonary emboli Right lower extremity DVT Hemodynamically stable.  Likely provoked by immobility related to her recent right knee pain.  No sign of right heart strain on CTA chest. Echocardiogram showed LVEF of 84%, grade 1 diastolic dysfunction, no strain pattern. Patient having ongoing chest discomfort and right lower extremity pain, remains stable, on room air (6/2) Has been stable wihtout bleeding on Heparin drip --Transition heparin >> Eliquis -- Monitor closely for bleeding, had recent rectal bleeding thought related to hemorrhoids requiring admission at [facility]. -- If tolerating anticoagulation without bleeding, likely transition to Eliquis in the  next 24 to 48 hours -- Troponins negative -- Vascular surgery consulted, no intervention needed 6/3 - repeat trop and CXR for L-sided CP.  Pain is atypical and seems musculoskeletal in nature.  Possible due to PE.  Right lower extremity spasms -suspect related to DVT.  Trial of Robaxin or oral magnesium.  Monitor electrolytes.  Expect improvement as clot burden clears.  Ambulatory dysfunction -due to above.   Initial PT recommendation is for SNF.  TOC consulted.  Headache - heparin held and CT head w/o contrast obtained - negative.  Heparin resumed.  Supportive care.  Monitor.  Recent rectal bleeding -reported admission at [facility].  Was treated with antibiotics for colitis.  Plans to follow-up with Dr. [name] of gastroenterology -- Monitor CBC and for signs of bleeding  History of depression -stable.  Continue Lexapro  Chronic anemia -stable.  Monitor CBC  Right knee pain with effusion -follows with Dr. Marlou Sa in orthopedics, consulted.  Has had a couple of outpatient aspirations.  Has been immobile due to knee pain.  Diastolic dysfunction, grade 1 -Per echo obtained this admission for evaluation of PE.  Appears euvolemic.  Monitor.  Patient BMI: Body mass index is 33.82 kg/m.   DVT prophylaxis:    Diet:  Diet Orders (From admission, onward)    Start     Ordered   08/04/20 2200  Diet Heart Room service appropriate? Yes; Fluid consistency: Thin  Diet effective now       Question Answer Comment  Room service appropriate? Yes   Fluid consistency: Thin  08/04/20 2200            Code Status: Full Code    Subjective 08/07/20    Patient up in recliner when seen today.  Having episodes of significant left-sided chest pain this AM.  Complains about delays in pain medications, and overnight med not given despite being due per orders.  We discussed prior home pain med regimen.  Her hx of 5 back surgeries contributes to pain, not controlled.  She has upcoming pain clinic  appointment.  No other acute complaints.  No SOB at rest.   Disposition Plan & Communication   Status is: Inpatient  Remains inpatient appropriate because:IV treatments appropriate due to intensity of illness or inability to take PO   Dispo: The patient is from: Home              Anticipated d/c is to: Home              Patient currently is not medically stable to d/c.   Difficult to place patient No   Consults, Procedures, Significant Events   Consultants:   None  Procedures:   None  Antimicrobials:  Anti-infectives (From admission, onward)   None        Micro    Objective   Vitals:   08/06/20 1446 08/06/20 2006 08/07/20 0654 08/07/20 1552  BP: 127/72 140/88 111/79 113/80  Pulse: 81 78 81 72  Resp: 19 16  18   Temp: 99.6 F (37.6 C) 98.6 F (37 C) 98.3 F (36.8 C) 98.7 F (37.1 C)  TempSrc: Oral Oral Oral Oral  SpO2: 97% 100% 90% 91%  Weight:      Height:        Intake/Output Summary (Last 24 hours) at 08/07/2020 1722 Last data filed at 08/07/2020 0200 Gross per 24 hour  Intake 324.5 ml  Output --  Net 324.5 ml   Filed Weights   08/05/20 1019  Weight: 81.2 kg    Physical Exam:  General exam: awake, alert, no acute distress, in recliner Respiratory system: CTAB diminished bases due to poor inspiratory effort, normal respiratory effort, on room air. Cardiovascular system: normal S1/S2, RRR   Central nervous system: A&O x3. no gross focal neurologic deficits, normal speech Extremities: no edema, moves all, no cyanosis Psychiatry: depressed mood, congruent affect  Labs   Data Reviewed: I have personally reviewed following labs and imaging studies  CBC: Recent Labs  Lab 08/04/20 1856 08/05/20 0340 08/06/20 0545 08/07/20 0515  WBC 8.0 8.6 9.5 12.1*  NEUTROABS 5.4  --   --   --   HGB 11.8* 11.1* 11.6* 11.4*  HCT 37.3 35.5* 36.6 35.8*  MCV 83.6 84.5 83.8 84.2  PLT 361 337 307 732   Basic Metabolic Panel: Recent Labs  Lab 08/04/20 1856  08/05/20 0340  NA 140 140  K 3.8 3.7  CL 104 104  CO2 28 31  GLUCOSE 108* 105*  BUN 10 13  CREATININE 0.84 0.77  CALCIUM 9.8 8.9   GFR: Estimated Creatinine Clearance: 73.2 mL/min (by C-G formula based on SCr of 0.77 mg/dL). Liver Function Tests: No results for input(s): AST, ALT, ALKPHOS, BILITOT, PROT, ALBUMIN in the last 168 hours. No results for input(s): LIPASE, AMYLASE in the last 168 hours. No results for input(s): AMMONIA in the last 168 hours. Coagulation Profile: Recent Labs  Lab 08/04/20 2211  INR 1.0   Cardiac Enzymes: No results for input(s): CKTOTAL, CKMB, CKMBINDEX, TROPONINI in the last 168 hours. BNP (last  3 results) No results for input(s): PROBNP in the last 8760 hours. HbA1C: No results for input(s): HGBA1C in the last 72 hours. CBG: No results for input(s): GLUCAP in the last 168 hours. Lipid Profile: No results for input(s): CHOL, HDL, LDLCALC, TRIG, CHOLHDL, LDLDIRECT in the last 72 hours. Thyroid Function Tests: No results for input(s): TSH, T4TOTAL, FREET4, T3FREE, THYROIDAB in the last 72 hours. Anemia Panel: No results for input(s): VITAMINB12, FOLATE, FERRITIN, TIBC, IRON, RETICCTPCT in the last 72 hours. Sepsis Labs: No results for input(s): PROCALCITON, LATICACIDVEN in the last 168 hours.  Recent Results (from the past 240 hour(s))  Resp Panel by RT-PCR (Flu A&B, Covid) Nasopharyngeal Swab     Status: None   Collection Time: 08/04/20  8:30 PM   Specimen: Nasopharyngeal Swab; Nasopharyngeal(NP) swabs in vial transport medium  Result Value Ref Range Status   SARS Coronavirus 2 by RT PCR NEGATIVE NEGATIVE Final    Comment: (NOTE) SARS-CoV-2 target nucleic acids are NOT DETECTED.  The SARS-CoV-2 RNA is generally detectable in upper respiratory specimens during the acute phase of infection. The lowest concentration of SARS-CoV-2 viral copies this assay can detect is 138 copies/mL. A negative result does not preclude SARS-Cov-2 infection  and should not be used as the sole basis for treatment or other patient management decisions. A negative result may occur with  improper specimen collection/handling, submission of specimen other than nasopharyngeal swab, presence of viral mutation(s) within the areas targeted by this assay, and inadequate number of viral copies(<138 copies/mL). A negative result must be combined with clinical observations, patient history, and epidemiological information. The expected result is Negative.  Fact Sheet for Patients:  EntrepreneurPulse.com.au  Fact Sheet for Healthcare Providers:  IncredibleEmployment.be  This test is no t yet approved or cleared by the [place] FDA and  has been authorized for detection and/or diagnosis of SARS-CoV-2 by FDA under an Emergency Use Authorization (EUA). This EUA will remain  in effect (meaning this test can be used) for the duration of the COVID-19 declaration under Section 564(b)(1) of the Act, 21 U.S.C.section 360bbb-3(b)(1), unless the authorization is terminated  or revoked sooner.       Influenza A by PCR NEGATIVE NEGATIVE Final   Influenza B by PCR NEGATIVE NEGATIVE Final    Comment: (NOTE) The Xpert Xpress SARS-CoV-2/FLU/RSV plus assay is intended as an aid in the diagnosis of influenza from Nasopharyngeal swab specimens and should not be used as a sole basis for treatment. Nasal washings and aspirates are unacceptable for Xpert Xpress SARS-CoV-2/FLU/RSV testing.  Fact Sheet for Patients: EntrepreneurPulse.com.au  Fact Sheet for Healthcare Providers: IncredibleEmployment.be  This test is not yet approved or cleared by the [place] FDA and has been authorized for detection and/or diagnosis of SARS-CoV-2 by FDA under an Emergency Use Authorization (EUA). This EUA will remain in effect (meaning this test can be used) for the duration of the COVID-19 declaration  under Section 564(b)(1) of the Act, 21 U.S.C. section 360bbb-3(b)(1), unless the authorization is terminated or revoked.  Performed at [facility], [place] [address], [place], [place] [address]       Imaging Studies   CT HEAD WO CONTRAST  Result Date: 08/06/2020 CLINICAL DATA:  Headache. EXAM: CT HEAD WITHOUT CONTRAST TECHNIQUE: Contiguous axial images were obtained from the base of the skull through the vertex without intravenous contrast. COMPARISON:  None. FINDINGS: Brain: No evidence of acute infarction, hemorrhage, hydrocephalus, extra-axial collection or mass lesion/mass effect. Vascular: No hyperdense vessel or  unexpected calcification. Skull: Normal. Negative for fracture or focal lesion. Sinuses/Orbits: No acute finding. Other: None. IMPRESSION: No acute intracranial abnormality seen. Electronically Signed   By: Marijo Conception M.D.   On: 08/06/2020 11:40   DG CHEST PORT 1 VIEW  Result Date: 08/07/2020 CLINICAL DATA:  Leukocytosis, LEFT chest pain since last night EXAM: PORTABLE CHEST 1 VIEW COMPARISON:  Portable exam 1304 hours compared to 02/10/2020 FINDINGS: Normal heart size, mediastinal contours, and pulmonary vascularity. Decreased lung volumes with bibasilar atelectasis greater on LEFT. Remaining lungs clear. No pleural effusion or pneumothorax. Bones unremarkable. IMPRESSION: Decreased lung volumes with bibasilar atelectasis, greater on LEFT. Electronically Signed   By: Lavonia Dana M.D.   On: 08/07/2020 14:46     Medications   Scheduled Meds: . acetaminophen  650 mg Oral Q6H   Or  . acetaminophen  650 mg Rectal Q6H  . escitalopram  15 mg Oral Daily  . oxyCODONE  10 mg Oral Q6H  . traZODone  50-100 mg Oral QHS   Continuous Infusions: . heparin 1,200 Units/hr (08/07/20 1337)       LOS: 1 day    Time spent: 25 minutes with . 50% spent at bedside and in coordination of care.    Ezekiel Slocumb, DO [facility]  08/07/2020, 5:22 PM       If 7PM-7AM, please contact night-coverage. How to contact the [facility] Attending or Consulting provider [place] or covering provider during after hours [place], for this patient?    1. Check the care team in [facility] and look for a) attending/consulting [facility] provider listed and b) the [facility] team listed 2. Log into [email or web address] and use [place]'s universal password to access. If you do not have the password, please contact the hospital operator. 3. Locate the [facility] provider you are looking for under [facility] and page to a number that you can be directly reached. 4. If you still have difficulty reaching the provider, please page the [facility] (Director on Call) for the Hospitalists listed on amion for assistance.

## 2020-08-07 NOTE — Progress Notes (Signed)
ANTICOAGULATION CONSULT NOTE - follow up  Pharmacy Consult for heparin Indication: pulmonary embolus, DVT  Allergies  Allergen Reactions  . Morphine Anaphylaxis, Hives, Shortness Of Breath and Rash  . Tramadol Anaphylaxis, Swelling and Rash    headache  . Gabapentin Rash and Swelling    Patient Measurements: Height: 5' 1"  (154.9 cm) Weight: 81.2 kg (179 lb 0.2 oz) IBW/kg (Calculated) : 47.8 Heparin Dosing Weight: 66 kg  Vital Signs: Temp: 98.3 F (36.8 C) (06/03 0654) Temp Source: Oral (06/03 0654) BP: 111/79 (06/03 0654) Pulse Rate: 81 (06/03 0654)  Labs: Recent Labs    08/04/20 1856 08/04/20 2211 08/05/20 0340 08/05/20 0349 08/06/20 0545 08/06/20 0756 08/06/20 1238 08/06/20 1357 08/06/20 1914 08/07/20 0515 08/07/20 1210  HGB 11.8*  --  11.1*  --  11.6*  --   --   --   --  11.4*  --   HCT 37.3  --  35.5*  --  36.6  --   --   --   --  35.8*  --   PLT 361  --  337  --  307  --   --   --   --  365  --   APTT  --  32  --   --   --   --   --   --   --   --   --   LABPROT  --  13.2  --   --   --   --   --   --   --   --   --   INR  --  1.0  --   --   --   --   --   --   --   --   --   HEPARINUNFRC  --   --  0.73*   < >  --    < >  --   --  0.22* 0.25* 0.72*  CREATININE 0.84  --  0.77  --   --   --   --   --   --   --   --   TROPONINIHS  --   --   --    < >  --   --  <2 <2  --   --  <2   < > = values in this interval not displayed.    Estimated Creatinine Clearance: 73.2 mL/min (by C-G formula based on SCr of 0.77 mg/dL).   Medical History: Past Medical History:  Diagnosis Date  . Arthritis    Back  . Depression    no medication  . Dyspnea    with exertion    Assessment: Pt is a 16 yoF presenting with complaints of knee/calf pain and swelling and SOB. Pt is s/p right knee arthroscopy for partial meniscectomy several months ago. Reports recent admission to [facility] with rectal bleeding. No anticoagulants PTA.  5/31 Venous doppler: Acute RLE  DVT 5/31 CTA: Scattered PE particularly on right side, no evidence right heart strain  Pharmacy consulted to dose/monitor heparin for VTE treatment. Significant Events: 6/2: Heparin drip was stopped earlier today d/t headache, then resume at 1239 at previously therapeutic rate of 1200 units/hr  08/07/2020  Heparin level now SUPRAtherapeutic on current heparin IV rate of 1450 units/hr  CBC: Hgb slightly low but stable; Plt WNL & stable  Confirmed with RN - no signs of bleeding, no interruptions  Goal of Therapy:  Heparin level 0.3-0.7 units/ml Monitor platelets  by anticoagulation protocol   Plan:   Decrease IV Heparin from 1450 units/hr to 1200 units/hr  Recheck heparin level 6 hours after rate decrease  Daily CBC & heparin level while on heparin infusion   Adrian Saran, PharmD, BCPS Secure Chat if ?s 08/07/2020 1:19 PM

## 2020-08-08 DIAGNOSIS — G8929 Other chronic pain: Secondary | ICD-10-CM

## 2020-08-08 LAB — MAGNESIUM: Magnesium: 2.1 mg/dL (ref 1.7–2.4)

## 2020-08-08 LAB — BASIC METABOLIC PANEL
Anion gap: 8 (ref 5–15)
BUN: 14 mg/dL (ref 6–20)
CO2: 26 mmol/L (ref 22–32)
Calcium: 9.2 mg/dL (ref 8.9–10.3)
Chloride: 101 mmol/L (ref 98–111)
Creatinine, Ser: 0.79 mg/dL (ref 0.44–1.00)
GFR, Estimated: >60 mL/min (ref >60)
Glucose, Bld: 96 mg/dL (ref 70–99)
Potassium: 4 mmol/L (ref 3.5–5.1)
Sodium: 135 mmol/L (ref 135–145)

## 2020-08-08 LAB — CBC
HCT: 35.7 % — ABNORMAL LOW (ref 36.0–46.0)
Hemoglobin: 11.2 g/dL — ABNORMAL LOW (ref 12.0–15.0)
MCH: 26.8 pg (ref 26.0–34.0)
MCHC: 31.4 g/dL (ref 30.0–36.0)
MCV: 85.4 fL (ref 80.0–100.0)
Platelets: 360 10*3/uL (ref 150–400)
RBC: 4.18 MIL/uL (ref 3.87–5.11)
RDW: 15.2 % (ref 11.5–15.5)
WBC: 8.6 10*3/uL (ref 4.0–10.5)
nRBC: 0 % (ref 0.0–0.2)

## 2020-08-08 MED ORDER — HYDROMORPHONE HCL 4 MG PO TABS
4.0000 mg | ORAL_TABLET | Freq: Four times a day (QID) | ORAL | Status: DC
  Administered 2020-08-08: 4 mg via ORAL
  Filled 2020-08-08: qty 1

## 2020-08-08 MED ORDER — APIXABAN 5 MG PO TABS: 5.0000 mg | ORAL_TABLET | Freq: Two times a day (BID) | ORAL | Status: DC

## 2020-08-08 MED ORDER — OXYCODONE HCL 5 MG PO TABS
5.0000 mg | ORAL_TABLET | Freq: Four times a day (QID) | ORAL | Status: DC
  Administered 2020-08-08 – 2020-08-09 (×3): 5 mg via ORAL
  Filled 2020-08-08 (×4): qty 1

## 2020-08-08 MED ORDER — ACETAMINOPHEN 650 MG RE SUPP: 650.0000 mg | Freq: Four times a day (QID) | RECTAL | Status: DC | PRN

## 2020-08-08 MED ORDER — APIXABAN 5 MG PO TABS
10.0000 mg | ORAL_TABLET | Freq: Two times a day (BID) | ORAL | Status: DC
  Administered 2020-08-08 – 2020-08-09 (×3): 10 mg via ORAL
  Filled 2020-08-08 (×4): qty 2

## 2020-08-08 MED ORDER — OXYCODONE-ACETAMINOPHEN 5-325 MG PO TABS
1.0000 | ORAL_TABLET | Freq: Four times a day (QID) | ORAL | Status: DC
  Administered 2020-08-08 – 2020-08-09 (×3): 1 via ORAL
  Filled 2020-08-08 (×4): qty 1

## 2020-08-08 MED ORDER — ACETAMINOPHEN 325 MG PO TABS: 650.0000 mg | ORAL_TABLET | Freq: Four times a day (QID) | ORAL | Status: DC | PRN

## 2020-08-08 NOTE — Progress Notes (Signed)
Physical Therapy Treatment Patient Details Name: Brittney Carter MRN: [id number] DOB: Nov 27, 1960 Today's Date: 08/08/2020    History of Present Illness Brittney Carter is a 60 y.o. female admitted 5/31 who has been having pain in the right knee since recent surgery.  Positive for DVT right LE and PE. Right LE spasms as well.  MD ordered head CT 6/2 as pt with incr headache since heparin initiated. PMH: depression    PT Comments    Pt able to increase ambulation distance today.  States she is going to go home and not rehab.  Verbally instructed on stair management, but didn't practice due to pain. Will need HHPT and a RW.   Follow Up Recommendations  Home health PT;Supervision - Intermittent     Equipment Recommendations  Rolling walker with 5" wheels    Recommendations for Other Services       Precautions / Restrictions Precautions Precautions: Fall Restrictions Weight Bearing Restrictions: No Other Position/Activity Restrictions: move more and go home and get rid of pain    Mobility  Bed Mobility Overal bed mobility: Needs Assistance Bed Mobility: Supine to Sit     Supine to sit: Min guard     General bed mobility comments: Uses L LE to assist R LE    Transfers Overall transfer level: Needs assistance Equipment used: Rolling walker (2 wheeled) Transfers: Sit to/from Stand Sit to Stand: Min guard         General transfer comment: cues for hand placement with both sit <> stand  Ambulation/Gait Ambulation/Gait assistance: Min guard Gait Distance (Feet): 70 Feet Assistive device: Rolling walker (2 wheeled) Gait Pattern/deviations: Step-to pattern;Decreased stance time - right;Decreased weight shift to right;Antalgic;Trunk flexed     General Gait Details: cues to stay within RW and not step past front of it for increased safety. HR 110. standing rest break at window. WB ~50% through R LE   Stairs             Wheelchair Mobility    Modified  [place] (Stroke Patients Only)       Balance Overall balance assessment: Needs assistance         Standing balance support: Bilateral upper extremity supported;During functional activity Standing balance-Leahy Scale: Poor Standing balance comment: requires UE support                            Cognition Arousal/Alertness: Awake/alert Behavior During Therapy: WFL for tasks assessed/performed;Anxious                                          Exercises      General Comments General comments (skin integrity, edema, etc.): c/o itchiness on back. She has shown nurse      Pertinent Vitals/Pain Pain Assessment: 0-10 Pain Score: 8  Pain Location: right LE Pain Descriptors / Indicators: Aching Pain Intervention(s): Premedicated before session;Limited activity within patient's tolerance;Monitored during session    Home Living                      Prior Function            PT Goals (current goals can now be found in the care plan section) Acute Rehab PT Goals PT Goal Formulation: With patient Time For Goal Achievement: 08/20/20 Potential to Achieve Goals: Good Progress towards PT  goals: Progressing toward goals    Frequency    Min 3X/week      PT Plan Discharge plan needs to be updated    Co-evaluation              AM-PAC PT "6 Clicks" Mobility   Outcome Measure  Help needed turning from your back to your side while in a flat bed without using bedrails?: None Help needed moving from lying on your back to sitting on the side of a flat bed without using bedrails?: A Little Help needed moving to and from a bed to a chair (including a wheelchair)?: A Little Help needed standing up from a chair using your arms (e.g., wheelchair or bedside chair)?: A Little Help needed to walk in hospital room?: A Little Help needed climbing 3-5 steps with a railing? : A Little 6 Click Score: 19    End of Session Equipment Utilized During  Treatment: Gait belt Activity Tolerance: Patient limited by pain Patient left: in bed;with call bell/phone within reach Nurse Communication: Mobility status PT Visit Diagnosis: Muscle weakness (generalized) (M62.81);Pain Pain - Right/Left: Right Pain - part of body: Leg     Time: 2725-3664 PT Time Calculation (min) (ACUTE ONLY): 28 min  Charges:  $Gait Training: 8-22 mins $Therapeutic Activity: 8-22 mins                     Jenan Ellegood L. Tamala Julian, [place] Pager [phone] 08/08/2020    Galen Manila 08/08/2020, 1:05 PM

## 2020-08-08 NOTE — Progress Notes (Signed)
PROGRESS NOTE    Brittney Carter   RWE:[id number]  DOB: [birth year]  PCP: [name], MD    DOA: 08/04/2020 LOS: 2   Brief Narrative   Brittney Carter is a 60 y.o. female with history of depression who has been having pain in the right knee since recent surgery has been having arthrocentesis by patient's orthopedist and last 1 was about a month ago has been having increasing pain in the right knee and in addition patient also complained of right calf pain.  Over the last 2 weeks the pain has been worsening along with patient having mild shortness of breath.  Denies any fever chills.   Patient states 3 weeks ago she was admitted at [facility] for rectal bleeding and was told that she had colitis and has outpatient follow-up with Dr. [name] of gastroenterology.   ED Course: In the ER x-rays revealed mild effusion in the right knee.  Dopplers done shows DVT of the right lower extremity and CT angiogram shows pulm embolism.  No strain pattern.  Given that patient had recent rectal bleeding admitted for further observation in the setting of patient being started on anticoagulation.  COVID test was negative.    Assessment & Plan   Principal Problem:   Acute pulmonary embolism (HCC) Active Problems:   Chronic pain   Acute pulmonary emboli Right lower extremity DVT Hemodynamically stable.  Likely provoked by immobility related to her recent right knee pain.  No sign of right heart strain on CTA chest. Echocardiogram showed LVEF of 61%, grade 1 diastolic dysfunction, no strain pattern. Patient having ongoing chest discomfort and right lower extremity pain, remains stable, on room air (6/2) Has been stable on anticoagulation wihtout bleeding. --Transitioned heparin >> Eliquis -- Monitor closely for bleeding, had recent rectal bleeding thought related to hemorrhoids requiring admission at [facility] -- Troponins negative -- Vascular surgery consulted, no intervention needed 6/3 -  repeat trop and CXR for L-sided CP.  Pain is atypical and seems musculoskeletal in nature.  Possible due to PE.  Right lower extremity spasms -suspect related to DVT.  Trial of Robaxin or oral magnesium.  Monitor electrolytes.  Expect improvement as clot burden clears.  Ambulatory dysfunction -due to above.   Initial PT recommendation is for SNF.  TOC consulted.  Chronic back pain - hx of 5 back surgeries. Has upcoming pain clinic appt to establish for long term pain meds.  We have struggled to control pain on oral meds without side effects. --Pain control per orders, titrate PO meds so no need for IV   Headache - CT head w/o contrast negative.   Supportive care.  Monitor.  Recent rectal bleeding -reported admission at [facility].  Was treated with antibiotics for colitis.   Plans to follow-up with Dr. [name] of gastroenterology -- Monitor CBC and for signs of bleeding  History of depression -stable.  Continue Lexapro  Chronic anemia -stable.  Monitor CBC  Right knee pain with effusion -follows with Dr. Marlou Sa in orthopedics, consulted.  Has had a couple of outpatient aspirations.  Has been immobile due to knee pain.  Diastolic dysfunction, grade 1 -Per echo obtained this admission for evaluation of PE.  Appears euvolemic.  Monitor.  Patient BMI: Body mass index is 33.82 kg/m.   DVT prophylaxis:    Diet:  Diet Orders (From admission, onward)    Start     Ordered   08/04/20 2200  Diet Heart Room service appropriate? Yes; Fluid consistency: Thin  Diet effective now       Question Answer Comment  Room service appropriate? Yes   Fluid consistency: Thin      08/04/20 2200            Code Status: Full Code    Subjective 08/08/20    Patient still has uncontrolled pain, and not tolerating Tylenol and oxycodone due to upset stomach.  Talks about IV dilaudid only thing we've given that helps.  Discussed trying oral Dilaudid, pt agreeable. Later RN messaged that PO dilaudid  did nothing to help and patient requests again to use Percocet.    She has been unable to tolerate even ambulating to the bathroom, per her nurse.   Disposition Plan & Communication   Status is: Inpatient  Remains inpatient appropriate because:IV treatments appropriate due to intensity of illness or inability to take PO   Dispo: The patient is from: Home              Anticipated d/c is to: Home              Patient currently is not medically stable to d/c.   Difficult to place patient No   Consults, Procedures, Significant Events   Consultants:   None  Procedures:   None  Antimicrobials:  Anti-infectives (From admission, onward)   None        Micro    Objective   Vitals:   08/07/20 1552 08/07/20 2109 08/08/20 0527 08/08/20 1330  BP: 113/80 107/71 109/73 133/83  Pulse: 72 78 73 93  Resp: 18 16 16 19   Temp: 98.7 F (37.1 C) 98.8 F (37.1 C) 98.7 F (37.1 C) 99 F (37.2 C)  TempSrc: Oral Oral Oral Oral  SpO2: 91% 91% 93% 98%  Weight:      Height:        Intake/Output Summary (Last 24 hours) at 08/08/2020 1730 Last data filed at 08/08/2020 1300 Gross per 24 hour  Intake 580 ml  Output --  Net 580 ml   Filed Weights   08/05/20 1019  Weight: 81.2 kg    Physical Exam:  General exam: awake, alert, no acute distress, sitting in bed Respiratory system: normal respiratory effort, on room air. Cardiovascular system: RRR, no edema Central nervous system: A&O x3. No gross focal neurologic deficits, normal speech Extremities: no edema, moves all, no cyanosis Psychiatry: depressed mood, congruent affect  Labs   Data Reviewed: I have personally reviewed following labs and imaging studies  CBC: Recent Labs  Lab 08/04/20 1856 08/05/20 0340 08/06/20 0545 08/07/20 0515 08/08/20 0659  WBC 8.0 8.6 9.5 12.1* 8.6  NEUTROABS 5.4  --   --   --   --   HGB 11.8* 11.1* 11.6* 11.4* 11.2*  HCT 37.3 35.5* 36.6 35.8* 35.7*  MCV 83.6 84.5 83.8 84.2 85.4  PLT 361  337 307 365 323   Basic Metabolic Panel: Recent Labs  Lab 08/04/20 1856 08/05/20 0340 08/08/20 0659  NA 140 140 135  K 3.8 3.7 4.0  CL 104 104 101  CO2 28 31 26   GLUCOSE 108* 105* 96  BUN 10 13 14   CREATININE 0.84 0.77 0.79  CALCIUM 9.8 8.9 9.2  MG  --   --  2.1   GFR: Estimated Creatinine Clearance: 73.2 mL/min (by C-G formula based on SCr of 0.79 mg/dL). Liver Function Tests: No results for input(s): AST, ALT, ALKPHOS, BILITOT, PROT, ALBUMIN in the last 168 hours. No results for input(s): LIPASE, AMYLASE in the  last 168 hours. No results for input(s): AMMONIA in the last 168 hours. Coagulation Profile: Recent Labs  Lab 08/04/20 2211  INR 1.0   Cardiac Enzymes: No results for input(s): CKTOTAL, CKMB, CKMBINDEX, TROPONINI in the last 168 hours. BNP (last 3 results) No results for input(s): PROBNP in the last 8760 hours. HbA1C: No results for input(s): HGBA1C in the last 72 hours. CBG: No results for input(s): GLUCAP in the last 168 hours. Lipid Profile: No results for input(s): CHOL, HDL, LDLCALC, TRIG, CHOLHDL, LDLDIRECT in the last 72 hours. Thyroid Function Tests: No results for input(s): TSH, T4TOTAL, FREET4, T3FREE, THYROIDAB in the last 72 hours. Anemia Panel: No results for input(s): VITAMINB12, FOLATE, FERRITIN, TIBC, IRON, RETICCTPCT in the last 72 hours. Sepsis Labs: No results for input(s): PROCALCITON, LATICACIDVEN in the last 168 hours.  Recent Results (from the past 240 hour(s))  Resp Panel by RT-PCR (Flu A&B, Covid) Nasopharyngeal Swab     Status: None   Collection Time: 08/04/20  8:30 PM   Specimen: Nasopharyngeal Swab; Nasopharyngeal(NP) swabs in vial transport medium  Result Value Ref Range Status   SARS Coronavirus 2 by RT PCR NEGATIVE NEGATIVE Final    Comment: (NOTE) SARS-CoV-2 target nucleic acids are NOT DETECTED.  The SARS-CoV-2 RNA is generally detectable in upper respiratory specimens during the acute phase of infection. The  lowest concentration of SARS-CoV-2 viral copies this assay can detect is 138 copies/mL. A negative result does not preclude SARS-Cov-2 infection and should not be used as the sole basis for treatment or other patient management decisions. A negative result may occur with  improper specimen collection/handling, submission of specimen other than nasopharyngeal swab, presence of viral mutation(s) within the areas targeted by this assay, and inadequate number of viral copies(<138 copies/mL). A negative result must be combined with clinical observations, patient history, and epidemiological information. The expected result is Negative.  Fact Sheet for Patients:  EntrepreneurPulse.com.au  Fact Sheet for Healthcare Providers:  IncredibleEmployment.be  This test is no t yet approved or cleared by the [place] FDA and  has been authorized for detection and/or diagnosis of SARS-CoV-2 by FDA under an Emergency Use Authorization (EUA). This EUA will remain  in effect (meaning this test can be used) for the duration of the COVID-19 declaration under Section 564(b)(1) of the Act, 21 U.S.C.section 360bbb-3(b)(1), unless the authorization is terminated  or revoked sooner.       Influenza A by PCR NEGATIVE NEGATIVE Final   Influenza B by PCR NEGATIVE NEGATIVE Final    Comment: (NOTE) The Xpert Xpress SARS-CoV-2/FLU/RSV plus assay is intended as an aid in the diagnosis of influenza from Nasopharyngeal swab specimens and should not be used as a sole basis for treatment. Nasal washings and aspirates are unacceptable for Xpert Xpress SARS-CoV-2/FLU/RSV testing.  Fact Sheet for Patients: EntrepreneurPulse.com.au  Fact Sheet for Healthcare Providers: IncredibleEmployment.be  This test is not yet approved or cleared by the [place] FDA and has been authorized for detection and/or diagnosis of SARS-CoV-2 by FDA under  an Emergency Use Authorization (EUA). This EUA will remain in effect (meaning this test can be used) for the duration of the COVID-19 declaration under Section 564(b)(1) of the Act, 21 U.S.C. section 360bbb-3(b)(1), unless the authorization is terminated or revoked.  Performed at [facility], [place] [address], [place], [place] [address]       Imaging Studies   DG CHEST PORT 1 VIEW  Result Date: 08/07/2020 CLINICAL DATA:  Leukocytosis, LEFT chest  pain since last night EXAM: PORTABLE CHEST 1 VIEW COMPARISON:  Portable exam 1304 hours compared to 02/10/2020 FINDINGS: Normal heart size, mediastinal contours, and pulmonary vascularity. Decreased lung volumes with bibasilar atelectasis greater on LEFT. Remaining lungs clear. No pleural effusion or pneumothorax. Bones unremarkable. IMPRESSION: Decreased lung volumes with bibasilar atelectasis, greater on LEFT. Electronically Signed   By: Lavonia Dana M.D.   On: 08/07/2020 14:46     Medications   Scheduled Meds: . apixaban  10 mg Oral BID   Followed by  . [START ON 08/14/2020] apixaban  5 mg Oral BID  . escitalopram  15 mg Oral Daily  . oxyCODONE  5 mg Oral Q6H  . oxyCODONE-acetaminophen  1 tablet Oral Q6H  . traZODone  50-100 mg Oral QHS   Continuous Infusions:      LOS: 2 days    Time spent: 30 minutes with . 50% spent at bedside and in coordination of care.    Ezekiel Slocumb, DO [facility]  08/08/2020, 5:30 PM      If 7PM-7AM, please contact night-coverage. How to contact the [facility] Attending or Consulting provider [place] or covering provider during after hours [place], for this patient?    1. Check the care team in [facility] and look for a) attending/consulting [facility] provider listed and b) the [facility] team listed 2. Log into [email or web address] and use [place]'s universal password to access. If you do not have the password, please contact the hospital operator. 3. Locate the [facility] provider you are looking for  under [facility] and page to a number that you can be directly reached. 4. If you still have difficulty reaching the provider, please page the [facility] (Director on Call) for the Hospitalists listed on amion for assistance.

## 2020-08-09 DIAGNOSIS — I824Z1 Acute embolism and thrombosis of unspecified deep veins of right distal lower extremity: Secondary | ICD-10-CM

## 2020-08-09 MED ORDER — HYDROCORTISONE 1 % EX CREA
TOPICAL_CREAM | Freq: Three times a day (TID) | CUTANEOUS | Status: DC
  Filled 2020-08-09: qty 28

## 2020-08-09 MED ORDER — HYDROCORTISONE 1 % EX CREA
TOPICAL_CREAM | Freq: Three times a day (TID) | CUTANEOUS | 0 refills | Status: DC
Start: 2020-08-09 — End: 2021-03-20

## 2020-08-09 MED ORDER — OXYCODONE-ACETAMINOPHEN 10-325 MG PO TABS: 1.0000 | ORAL_TABLET | ORAL | 0 refills | Status: AC | PRN

## 2020-08-09 MED ORDER — MAGNESIUM OXIDE -MG SUPPLEMENT 400 (240 MG) MG PO TABS: 400.0000 mg | ORAL_TABLET | Freq: Two times a day (BID) | ORAL | Status: DC | PRN

## 2020-08-09 MED ORDER — ELIQUIS 5 MG PO TABS: ORAL_TABLET | ORAL | 0 refills | Status: DC

## 2020-08-09 NOTE — TOC Transition Note (Signed)
Transition of Care [facility]) - CM/SW Discharge Note   Patient Details  Name: Brittney Carter MRN: [id number] Date of Birth: 09/15/1960  Transition of [facility]) CM/SW Contact:  Lynnell Catalan, RN Phone Number: 08/09/2020, 12:43 PM   Clinical Narrative:     Spoke with pt for dc needs. Offered choice and set up with [facility] for HHPT/OT/Aide. Alerted pt that shower stool ordered by MD would be private pay. Pt states she will get one at [facility]. Pt does request [facility]W and [employer] to deliver to room. Pt states she is not driving right now. She states that she is working with her [facility] [employer] for transportation through them. She states she will continue working with them as other transportation resources are limited.

## 2020-08-09 NOTE — Progress Notes (Signed)
Patient discharges to home with daughter. Discharge instructions reviewed with patient who verbalized understanding.

## 2020-08-10 ENCOUNTER — Ambulatory Visit: Payer: Medicare Other | Admitting: Orthopedic Surgery

## 2020-08-11 ENCOUNTER — Other Ambulatory Visit: Payer: Self-pay | Admitting: *Deleted

## 2020-08-11 ENCOUNTER — Encounter: Payer: Self-pay | Admitting: Gastroenterology

## 2020-08-11 ENCOUNTER — Encounter: Payer: Self-pay | Admitting: Physician Assistant

## 2020-08-11 DIAGNOSIS — Z7901 Long term (current) use of anticoagulants: Secondary | ICD-10-CM | POA: Diagnosis not present

## 2020-08-11 DIAGNOSIS — M1711 Unilateral primary osteoarthritis, right knee: Secondary | ICD-10-CM | POA: Diagnosis not present

## 2020-08-11 DIAGNOSIS — F32A Depression, unspecified: Secondary | ICD-10-CM | POA: Diagnosis not present

## 2020-08-11 DIAGNOSIS — Z9181 History of falling: Secondary | ICD-10-CM | POA: Diagnosis not present

## 2020-08-12 ENCOUNTER — Encounter: Payer: Self-pay | Admitting: *Deleted

## 2020-08-12 ENCOUNTER — Other Ambulatory Visit: Payer: Self-pay | Admitting: *Deleted

## 2020-08-12 DIAGNOSIS — I824Z1 Acute embolism and thrombosis of unspecified deep veins of right distal lower extremity: Secondary | ICD-10-CM

## 2020-08-12 NOTE — Discharge Summary (Addendum)
Physician Discharge Summary  JAMYAH FOLK CWC:[id number] DOB: [birth year] DOA: 08/04/2020  PCP: [name], MD  Admit date: 08/04/2020 Discharge date: 08/09/2020  Admitted From: home Disposition:  Home  Recommendations for Outpatient Follow-up:  Follow up with PCP in 1-2 weeks Please obtain BMP/CBC in one week Please follow up with orthopedic surgery for right knee pain Follow up with gastroenterology as scheduled  Home Health: PT, OT, aide  Equipment/Devices: rolling walker   Discharge Condition: stable  CODE STATUS: full  Diet recommendation: Heart Healthy      Discharge Diagnoses: Principal Problem:   Acute pulmonary embolism ([place]) Active Problems:   Chronic pain    Summary of HPI and Hospital Course:  KAZARIA GAERTNER is a 60 y.o. female with history of depression who has been having pain in the right knee since recent surgery has been having arthrocentesis by patient's orthopedist and last 1 was about a month ago has been having increasing pain in the right knee and in addition patient also complained of right calf pain.  Over the last 2 weeks the pain has been worsening along with patient having mild shortness of breath.  Denies any fever chills.   Patient states 3 weeks ago she was admitted at [facility] for rectal bleeding and was told that she had colitis and has outpatient follow-up with Dr. [name] of gastroenterology.   ED Course: In the ER x-rays revealed mild effusion in the right knee.  Dopplers done shows DVT of the right lower extremity and CT angiogram shows pulm embolism.  No strain pattern.  Given that patient had recent rectal bleeding admitted for further observation in the setting of patient being started on anticoagulation.  COVID test was negative.     Acute pulmonary emboli Right lower extremity DVT Hemodynamically stable.  Likely provoked by immobility related to her recent right knee pain.  No sign of right heart strain on CTA  chest. Echocardiogram showed LVEF of 76%, grade 1 diastolic dysfunction, no strain pattern. Has been stable on anticoagulation wihtout bleeding. --Transitioned heparin >> Eliquis -- Troponins negative -- Vascular surgery consulted, no intervention needed   Right lower extremity spasms -Resolved.   Suspect related to DVT.   Trial of Robaxin or oral magnesium. Electrolytes okay.   Expect improvement as clot burden clears.   Ambulatory dysfunction -due to above.   Initial PT recommendation is for SNF, updated to [facility] as pt improved.  TOC consulted and set up [facility] services.   Chronic back pain - hx of 5 back surgeries. Has upcoming pain clinic appt to establish for long term pain meds.  Some issues controlling pain during admission due to side effects.  She did best on Percocet 10-325.     Headache - CT head w/o contrast negative.   Supportive care.  Resolved.   Recent rectal bleeding -reported admission at [facility].  Was treated with antibiotics for colitis.   Plans to follow-up with Dr. [name] of gastroenterology. Patient instructed to stop Eliquis if she develops bleeding.  History of depression -stable.  Continue Lexapro   Chronic anemia -stable.  Monitor CBC   Right knee pain with effusion -follows with Dr. Marlou Sa in orthopedics, consulted.  Has had a couple of outpatient aspirations.  Has been immobile due to knee pain.   Diastolic dysfunction, grade 1 -Per echo obtained this admission for evaluation of PE.  Appears euvolemic.  Monitor.   Discharge Instructions   Discharge Instructions     Call MD for:  extreme fatigue   Complete by: As directed    Call MD for:  persistant dizziness or light-headedness   Complete by: As directed    Call MD for:  persistant nausea and vomiting   Complete by: As directed    Call MD for:  severe uncontrolled pain   Complete by: As directed    Call MD for:  temperature >100.4   Complete by: As directed    Diet - low sodium heart healthy    Complete by: As directed    Discharge instructions   Complete by: As directed    Please take Eliquis (apixaban) as prescribed. You start on a higher "loading" dose of 10 mg twice for the first week (you have 6 days left). Then reduce to 5 mg twice daily and continue on that dose until your doctor tells you okay to stop taking.   Increase activity slowly   Complete by: As directed       Allergies as of 08/09/2020       Reactions   Morphine Anaphylaxis, Hives, Shortness Of Breath, Rash   Tramadol Anaphylaxis, Swelling, Rash   headache   Gabapentin Rash, Swelling        Medication List     TAKE these medications    diclofenac Sodium 1 % Gel Commonly known as: VOLTAREN Apply 2 g topically 4 (four) times daily as needed (right knee pain).   Eliquis 5 MG Tabs tablet Generic drug: apixaban Take 2 tablets (10 mg total) by mouth 2 (two) times daily for 6 days, THEN 1 tablet (5 mg total) 2 (two) times daily for 24 days. Start taking on: August 09, 2020   escitalopram 10 MG tablet Commonly known as: LEXAPRO Take 15 mg by mouth daily.   hydrocortisone cream 1 % Apply topically 3 (three) times daily.   magnesium oxide 400 (240 Mg) MG tablet Commonly known as: MAG-OX Take 1 tablet (400 mg total) by mouth 2 (two) times daily as needed (leg cramp/spasms).   naloxone 4 MG/0.1ML Liqd nasal spray kit Commonly known as: NARCAN Place 4 mg into the nose as needed (overdose).   oxyCODONE-acetaminophen 10-325 MG tablet Commonly known as: Percocet Take 1 tablet by mouth every 4 (four) hours as needed for up to 5 days for pain.   SYSTANE CONTACTS OP Place 1 drop into both eyes daily as needed (dry eyes).   traZODone 50 MG tablet Commonly known as: DESYREL Take 50-100 mg by mouth at bedtime.        Follow-up Information     [facility], [facility] Follow up.   Specialty: Home Health Services Contact information: [address] [place]  [address] [phone]                Allergies  Allergen Reactions   Morphine Anaphylaxis, Hives, Shortness Of Breath and Rash   Tramadol Anaphylaxis, Swelling and Rash    headache   Gabapentin Rash and Swelling     If you experience worsening of your admission symptoms, develop shortness of breath, life threatening emergency, suicidal or homicidal thoughts you must seek medical attention immediately by calling 911 or calling your MD immediately  if symptoms less severe.    Please note   You were cared for by a hospitalist during your hospital stay. If you have any questions about your discharge medications or the care you received while you were in the hospital after you are discharged, you can call the unit and asked  to speak with the hospitalist on call if the hospitalist that took care of you is not available. Once you are discharged, your primary care physician will handle any further medical issues. Please note that NO REFILLS for any discharge medications will be authorized once you are discharged, as it is imperative that you return to your primary care physician (or establish a relationship with a primary care physician if you do not have one) for your aftercare needs so that they can reassess your need for medications and monitor your lab values.   Consultations: Orthopedic surgery Vascular surgery    Procedures/Studies: CT HEAD WO CONTRAST  Result Date: 08/06/2020 CLINICAL DATA:  Headache. EXAM: CT HEAD WITHOUT CONTRAST TECHNIQUE: Contiguous axial images were obtained from the base of the skull through the vertex without intravenous contrast. COMPARISON:  None. FINDINGS: Brain: No evidence of acute infarction, hemorrhage, hydrocephalus, extra-axial collection or mass lesion/mass effect. Vascular: No hyperdense vessel or unexpected calcification. Skull: Normal. Negative for fracture or focal lesion. Sinuses/Orbits: No acute finding. Other: None. IMPRESSION: No acute  intracranial abnormality seen. Electronically Signed   By: Marijo Conception M.D.   On: 08/06/2020 11:40   CT Angio Chest PE W and/or Wo Contrast  Result Date: 08/04/2020 CLINICAL DATA:  Right lower extremity deep venous thrombosis with shortness of breath and chest pain EXAM: CT ANGIOGRAPHY CHEST WITH CONTRAST TECHNIQUE: Multidetector CT imaging of the chest was performed using the standard protocol during bolus administration of intravenous contrast. Multiplanar CT image reconstructions and MIPs were obtained to evaluate the vascular anatomy. CONTRAST:  131m OMNIPAQUE IOHEXOL 350 MG/ML SOLN COMPARISON:  01/04/2020 FINDINGS: Cardiovascular: Thoracic aorta and its branches are within normal limits. No cardiac enlargement is seen. No coronary calcifications are noted. The pulmonary artery shows a normal branching pattern. A few filling defects are identified within the right pulmonary artery consistent with small pulmonary emboli. No right heart strain is noted. Mediastinum/Nodes: Thoracic inlet is within normal limits. No sizable hilar or mediastinal adenopathy is noted. The esophagus as visualized is within normal limits. Lungs/Pleura: Lungs are clear. No pleural effusion or pneumothorax. Upper Abdomen: Visualized upper abdomen is unremarkable. Musculoskeletal: Degenerative changes of the thoracic spine are noted. Review of the MIP images confirms the above findings. IMPRESSION: Scattered pulmonary emboli particularly on the right without evidence of right heart strain. No other focal abnormality is noted. Critical Value/emergent results were called by telephone at the time of interpretation on 08/04/2020 at 8:19 pm to Dr. MOctaviano Glow, who verbally acknowledged these results. Electronically Signed   By: MInez CatalinaM.D.   On: 08/04/2020 20:23   DG CHEST PORT 1 VIEW  Result Date: 08/07/2020 CLINICAL DATA:  Leukocytosis, LEFT chest pain since last night EXAM: PORTABLE CHEST 1 VIEW COMPARISON:  Portable exam  1304 hours compared to 02/10/2020 FINDINGS: Normal heart size, mediastinal contours, and pulmonary vascularity. Decreased lung volumes with bibasilar atelectasis greater on LEFT. Remaining lungs clear. No pleural effusion or pneumothorax. Bones unremarkable. IMPRESSION: Decreased lung volumes with bibasilar atelectasis, greater on LEFT. Electronically Signed   By: MLavonia DanaM.D.   On: 08/07/2020 14:46   DG Knee Complete 4 Views Right  Result Date: 08/04/2020 CLINICAL DATA:  Right knee pain and swelling. EXAM: RIGHT KNEE - COMPLETE 4+ VIEW COMPARISON:  February 02, 2020. FINDINGS: No evidence of fracture or dislocation. Small suprapatellar joint effusion may be present. No evidence of arthropathy or other focal bone abnormality. Soft tissues are unremarkable. IMPRESSION: Probable small suprapatellar joint  effusion. No fracture or dislocation is noted. Electronically Signed   By: Marijo Conception M.D.   On: 08/04/2020 16:26   ECHOCARDIOGRAM COMPLETE  Result Date: 08/05/2020    ECHOCARDIOGRAM REPORT   Patient Name:   TALIA HOHEISEL Date of Exam: 08/05/2020 Medical Rec #:  [id number]         Height:       61.0 in Accession #:    [id number]        Weight:       179.0 lb Date of Birth:  [birth year]         BSA:          1.802 m Patient Age:    74 years          BP:           111/64 mmHg Patient Gender: F                 HR:           67 bpm. Exam Location:  Inpatient Procedure: 2D Echo, 3D Echo, Cardiac Doppler, Color Doppler and Strain Analysis Indications:    Chest Pain R07.9  History:        Patient has no prior history of Echocardiogram examinations.                 Signs/Symptoms:Shortness of Breath. Recent surgery of the right                 knee. DVT. Pulmonary embolism.  Sonographer:    Darlina Sicilian RDCS Referring Phys: [place]  1. Left ventricular ejection fraction by 3D volume is 59 %. The left ventricle has normal function. The left ventricle has no regional wall motion  abnormalities. Left ventricular diastolic parameters are consistent with Grade I diastolic dysfunction (impaired relaxation). The average left ventricular global longitudinal strain is -22.2 %. The global longitudinal strain is normal.  2. Right ventricular systolic function is normal. The right ventricular size is normal. Tricuspid regurgitation signal is inadequate for assessing PA pressure.  3. The mitral valve is normal in structure. Trivial mitral valve regurgitation. No evidence of mitral stenosis.  4. The aortic valve is grossly normal. Aortic valve regurgitation is not visualized. No aortic stenosis is present.  5. The inferior vena cava is normal in size with greater than 50% respiratory variability, suggesting right atrial pressure of 3 mmHg. FINDINGS  Left Ventricle: Left ventricular ejection fraction by 3D volume is 59 %. The left ventricle has normal function. The left ventricle has no regional wall motion abnormalities. The average left ventricular global longitudinal strain is -22.2 %. The global  longitudinal strain is normal. The left ventricular internal cavity size was normal in size. There is no left ventricular hypertrophy. Left ventricular diastolic parameters are consistent with Grade I diastolic dysfunction (impaired relaxation). Right Ventricle: The right ventricular size is normal. No increase in right ventricular wall thickness. Right ventricular systolic function is normal. Tricuspid regurgitation signal is inadequate for assessing PA pressure. Left Atrium: Left atrial size was normal in size. Right Atrium: Right atrial size was normal in size. Pericardium: There is no evidence of pericardial effusion. Mitral Valve: The mitral valve is normal in structure. Trivial mitral valve regurgitation. No evidence of mitral valve stenosis. Tricuspid Valve: The tricuspid valve is normal in structure. Tricuspid valve regurgitation is not demonstrated. No evidence of tricuspid stenosis. Aortic Valve:  The aortic valve is grossly normal. Aortic valve regurgitation is  not visualized. No aortic stenosis is present. Pulmonic Valve: The pulmonic valve was not well visualized. Pulmonic valve regurgitation is not visualized. No evidence of pulmonic stenosis. Aorta: The aortic root is normal in size and structure. Venous: The inferior vena cava is normal in size with greater than 50% respiratory variability, suggesting right atrial pressure of 3 mmHg. IAS/Shunts: No atrial level shunt detected by color flow Doppler.  LEFT VENTRICLE PLAX 2D LVIDd:         4.90 cm         Diastology LVIDs:         3.90 cm         LV e' medial:    5.77 cm/s LV PW:         0.90 cm         LV E/e' medial:  9.6 LV IVS:        1.00 cm         LV e' lateral:   8.05 cm/s LVOT diam:     1.80 cm         LV E/e' lateral: 6.9 LV SV:         48 LV SV Index:   27              2D LVOT Area:     2.54 cm        Longitudinal                                Strain                                2D Strain GLS  -22.2 %                                Avg:                                 3D Volume EF                                LV 3D EF:    Left                                             ventricular                                             ejection                                             fraction by                                             3D volume  is 59 %.                                 3D Volume EF:                                3D EF:        59 %                                LV EDV:       114 ml                                LV ESV:       47 ml                                LV SV:        67 ml RIGHT VENTRICLE RV S prime:     13.30 cm/s TAPSE (M-mode): 2.0 cm LEFT ATRIUM             Index       RIGHT ATRIUM          Index LA diam:        2.90 cm 1.61 cm/m  RA Area:     8.95 cm LA Vol (A2C):   27.9 ml 15.48 ml/m RA Volume:   14.90 ml 8.27 ml/m LA Vol (A4C):   29.4 ml 16.32 ml/m LA Biplane  Vol: 28.6 ml 15.87 ml/m  AORTIC VALVE LVOT Vmax:   92.50 cm/s LVOT Vmean:  59.100 cm/s LVOT VTI:    0.190 m  AORTA Ao Root diam: 3.20 cm Ao Asc diam:  2.90 cm MITRAL VALVE MV Area (PHT): 2.62 cm    SHUNTS MV Decel Time: 290 msec    Systemic VTI:  0.19 m MV E velocity: 55.30 cm/s  Systemic Diam: 1.80 cm MV A velocity: 71.60 cm/s MV E/A ratio:  0.77 Cherlynn Kaiser MD Electronically signed by Cherlynn Kaiser MD Signature Date/Time: 08/05/2020/4:28:25 PM    Final    VAS [place] LOWER EXTREMITY VENOUS (DVT) (ONLY [facility] & [facility] 7a-7p)  Result Date: 08/04/2020  Lower Venous DVT Study Patient Name:  JAMAYAH MYSZKA  Date of Exam:   08/04/2020 Medical Rec #: [id number]          Accession #:    [id number] Date of Birth: 22-Jun-1960          Patient Gender: F Patient Age:   23Y Exam Location:  [facility] Procedure:      VAS [place] LOWER EXTREMITY VENOUS (DVT) Referring Phys: [id number] HINA KHATRI --------------------------------------------------------------------------------  Indications: Swelling.  Risk Factors: None identified. Comparison Study: No prior studies. Performing Technologist: Oliver Hum RVT  Examination Guidelines: A complete evaluation includes B-mode imaging, spectral Doppler, color Doppler, and power Doppler as needed of all accessible portions of each vessel. Bilateral testing is considered an integral part of a complete examination. Limited examinations for reoccurring indications may be performed as noted. The reflux portion of the exam is performed with the patient in reverse Trendelenburg.  +---------+---------------+---------+-----------+----------+--------------+ RIGHT    CompressibilityPhasicitySpontaneityPropertiesThrombus Aging +---------+---------------+---------+-----------+----------+--------------+ CFV      Full  Yes      Yes                                 +---------+---------------+---------+-----------+----------+--------------+ SFJ      Full                                                         +---------+---------------+---------+-----------+----------+--------------+ FV Prox  Full                                                        +---------+---------------+---------+-----------+----------+--------------+ FV Mid   Full                                                        +---------+---------------+---------+-----------+----------+--------------+ FV DistalFull                                                        +---------+---------------+---------+-----------+----------+--------------+ PFV      Full                                                        +---------+---------------+---------+-----------+----------+--------------+ POP      None           No       No                   Acute          +---------+---------------+---------+-----------+----------+--------------+ PTV      Partial                                      Acute          +---------+---------------+---------+-----------+----------+--------------+ PERO     Partial                                      Acute          +---------+---------------+---------+-----------+----------+--------------+   +----+---------------+---------+-----------+----------+--------------+ LEFTCompressibilityPhasicitySpontaneityPropertiesThrombus Aging +----+---------------+---------+-----------+----------+--------------+ CFV Full           Yes      Yes                                 +----+---------------+---------+-----------+----------+--------------+     Summary: RIGHT: - Findings consistent with acute deep vein thrombosis involving the right popliteal vein, right posterior tibial veins, and right peroneal veins. - No cystic structure found in  the popliteal fossa.  LEFT: - No evidence of common femoral vein obstruction.  *See table(s) above for measurements and observations. Electronically signed by Deitra Mayo MD on 08/04/2020 at 6:43:46 PM.    Final     CT Angio Abd/Pel W and/or Wo Contrast  Result Date: 07/22/2020 CLINICAL DATA:  GI bleed Mesenteric ischemia, acute EXAM: CTA ABDOMEN AND PELVIS WITHOUT AND WITH CONTRAST TECHNIQUE: Multidetector CT imaging of the abdomen and pelvis was performed using the standard protocol during bolus administration of intravenous contrast. Multiplanar reconstructed images and MIPs were obtained and reviewed to evaluate the vascular anatomy. CONTRAST:  33m OMNIPAQUE IOHEXOL 350 MG/ML SOLN COMPARISON:  Jul 16, 2020 FINDINGS: VASCULAR Aorta: Normal caliber aorta without aneurysm, dissection, vasculitis or significant stenosis. Celiac: Patent without evidence of aneurysm, dissection, vasculitis or significant stenosis. SMA: Patent without evidence of aneurysm, dissection, vasculitis or significant stenosis. Renals: Both renal arteries are patent without evidence of aneurysm, dissection, vasculitis, fibromuscular dysplasia or significant stenosis. IMA: Patent without evidence of aneurysm, dissection, vasculitis or significant stenosis. Inflow: Patent without evidence of aneurysm, dissection, vasculitis or significant stenosis. Proximal Outflow: Bilateral common femoral and visualized portions of the superficial and profunda femoral arteries are patent without evidence of aneurysm, dissection, vasculitis or significant stenosis. Veins: No obvious venous abnormality within the limitations of this arterial phase study. Review of the MIP images confirms the above findings. NON-VASCULAR Lower chest: Bibasilar atelectasis. Normal size heart. No significant pericardial effusion/thickening. Hepatobiliary: No suspicious hepatic lesion. Gallbladder is predominantly decompressed otherwise unremarkable. No biliary ductal dilation. Pancreas: Unremarkable. No pancreatic ductal dilatation or surrounding inflammatory changes. Spleen: Normal in size without focal abnormality. Adrenals/Urinary Tract: Adrenal glands are unremarkable. Kidneys are  normal, without renal calculi, focal lesion, or hydronephrosis. Bladder is unremarkable for degree of distension. Stomach/Bowel: Stomach is within normal limits. Normal positioning of the duodenum/ligament of [place]. No pathologic dilation of small bowel. The appendix and terminal ileum are grossly unremarkable. There is decreased but persistent wall thickening of the sigmoid colon and rectum. No pneumatosis or signs of perforation. Colonic diverticulosis without findings of acute diverticulitis. Lymphatic: No gastrohepatic or hepatoduodenal ligament lymphadenopathy. No retroperitoneal or mesenteric lymphadenopathy. No pelvic sidewall lymphadenopathy. No groin lymphadenopathy. Reproductive: Uterus and bilateral adnexa are unremarkable. Other: No abdominal wall hernia or abnormality. No abdominopelvic ascites. Musculoskeletal: L4-L5 discogenic disease. No acute osseous abnormality. IMPRESSION: 1. Improved but persistent wall thickening of the sigmoid colon and rectum, suggestive of infectious or inflammatory colitis/proctitis. No pneumatosis or signs of perforation. 2. Mesenteric vasculature is patent. No evidence of active gastrointestinal bleed. If continued clinical concern for active gastrointestinal bleed consider further evaluation with nuclear medicine tagged red blood cell scan. 3. Colonic diverticulosis without findings of acute diverticulitis. Electronically Signed   By: JDahlia BailiffMD   On: 07/22/2020 02:35      Subjective: Pt feels well today. Pain better controlled.  Now feeling more confident about going home since she is moving around better with pain contorlled.   Discharge Exam: Vitals:   08/08/20 2014 08/09/20 0629  BP: 119/77 104/72  Pulse: 80 70  Resp: 16 16  Temp: 98.9 F (37.2 C) 98.3 F (36.8 C)  SpO2: 95% 92%   Vitals:   08/08/20 0527 08/08/20 1330 08/08/20 2014 08/09/20 0629  BP: 109/73 133/83 119/77 104/72  Pulse: 73 93 80 70  Resp: 16 19 16 16   Temp: 98.7 F (37.1  C) 99 F (37.2 C) 98.9 F (37.2 C) 98.3 F (36.8 C)  TempSrc: Oral  Oral Oral Oral  SpO2: 93% 98% 95% 92%  Weight:      Height:        General: Pt is alert, awake, not in acute distress Cardiovascular: RRR, S1/S2 +, no rubs, no gallops Respiratory: CTA bilaterally, no wheezing, no rhonchi Abdominal: Soft, NT, ND, bowel sounds + Extremities: no edema, no cyanosis    The results of significant diagnostics from this hospitalization (including imaging, microbiology, ancillary and laboratory) are listed below for reference.     Microbiology: Recent Results (from the past 240 hour(s))  Resp Panel by RT-PCR (Flu A&B, Covid) Nasopharyngeal Swab     Status: None   Collection Time: 08/04/20  8:30 PM   Specimen: Nasopharyngeal Swab; Nasopharyngeal(NP) swabs in vial transport medium  Result Value Ref Range Status   SARS Coronavirus 2 by RT PCR NEGATIVE NEGATIVE Final    Comment: (NOTE) SARS-CoV-2 target nucleic acids are NOT DETECTED.  The SARS-CoV-2 RNA is generally detectable in upper respiratory specimens during the acute phase of infection. The lowest concentration of SARS-CoV-2 viral copies this assay can detect is 138 copies/mL. A negative result does not preclude SARS-Cov-2 infection and should not be used as the sole basis for treatment or other patient management decisions. A negative result may occur with  improper specimen collection/handling, submission of specimen other than nasopharyngeal swab, presence of viral mutation(s) within the areas targeted by this assay, and inadequate number of viral copies(<138 copies/mL). A negative result must be combined with clinical observations, patient history, and epidemiological information. The expected result is Negative.  Fact Sheet for Patients:  EntrepreneurPulse.com.au  Fact Sheet for Healthcare Providers:  IncredibleEmployment.be  This test is no t yet approved or cleared by the [place] FDA and  has been authorized for detection and/or diagnosis of SARS-CoV-2 by FDA under an Emergency Use Authorization (EUA). This EUA will remain  in effect (meaning this test can be used) for the duration of the COVID-19 declaration under Section 564(b)(1) of the Act, 21 U.S.C.section 360bbb-3(b)(1), unless the authorization is terminated  or revoked sooner.       Influenza A by PCR NEGATIVE NEGATIVE Final   Influenza B by PCR NEGATIVE NEGATIVE Final    Comment: (NOTE) The Xpert Xpress SARS-CoV-2/FLU/RSV plus assay is intended as an aid in the diagnosis of influenza from Nasopharyngeal swab specimens and should not be used as a sole basis for treatment. Nasal washings and aspirates are unacceptable for Xpert Xpress SARS-CoV-2/FLU/RSV testing.  Fact Sheet for Patients: EntrepreneurPulse.com.au  Fact Sheet for Healthcare Providers: IncredibleEmployment.be  This test is not yet approved or cleared by the [place] FDA and has been authorized for detection and/or diagnosis of SARS-CoV-2 by FDA under an Emergency Use Authorization (EUA). This EUA will remain in effect (meaning this test can be used) for the duration of the COVID-19 declaration under Section 564(b)(1) of the Act, 21 U.S.C. section 360bbb-3(b)(1), unless the authorization is terminated or revoked.  Performed at [facility], [place] [address], [place], [place] [address]      Labs: BNP (last 3 results) No results for input(s): BNP in the last 8760 hours. Basic Metabolic Panel: Recent Labs  Lab 08/08/20 0659  NA 135  K 4.0  CL 101  CO2 26  GLUCOSE 96  BUN 14  CREATININE 0.79  CALCIUM 9.2  MG 2.1   Liver Function Tests: No results for input(s): AST, ALT, ALKPHOS, BILITOT, PROT, ALBUMIN in the last 168 hours. No results for input(s): LIPASE,  AMYLASE in the last 168 hours. No results for input(s): AMMONIA in the last 168 hours. CBC: Recent  Labs  Lab 08/06/20 0545 08/07/20 0515 08/08/20 0659  WBC 9.5 12.1* 8.6  HGB 11.6* 11.4* 11.2*  HCT 36.6 35.8* 35.7*  MCV 83.8 84.2 85.4  PLT 307 365 360   Cardiac Enzymes: No results for input(s): CKTOTAL, CKMB, CKMBINDEX, TROPONINI in the last 168 hours. BNP: Invalid input(s): POCBNP CBG: No results for input(s): GLUCAP in the last 168 hours. D-Dimer No results for input(s): DDIMER in the last 72 hours. Hgb A1c No results for input(s): HGBA1C in the last 72 hours. Lipid Profile No results for input(s): CHOL, HDL, LDLCALC, TRIG, CHOLHDL, LDLDIRECT in the last 72 hours. Thyroid function studies No results for input(s): TSH, T4TOTAL, T3FREE, THYROIDAB in the last 72 hours.  Invalid input(s): FREET3 Anemia work up No results for input(s): VITAMINB12, FOLATE, FERRITIN, TIBC, IRON, RETICCTPCT in the last 72 hours. Urinalysis    Component Value Date/Time   COLORURINE YELLOW 07/22/2020 0401   APPEARANCEUR CLEAR 07/22/2020 0401   LABSPEC 1.020 07/22/2020 0401   PHURINE 6.0 07/22/2020 0401   GLUCOSEU NEGATIVE 07/22/2020 0401   HGBUR NEGATIVE 07/22/2020 0401   BILIRUBINUR NEGATIVE 07/22/2020 0401   KETONESUR NEGATIVE 07/22/2020 0401   PROTEINUR NEGATIVE 07/22/2020 0401   NITRITE NEGATIVE 07/22/2020 0401   LEUKOCYTESUR TRACE (A) 07/22/2020 0401   Sepsis Labs Invalid input(s): PROCALCITONIN,  WBC,  LACTICIDVEN Microbiology Recent Results (from the past 240 hour(s))  Resp Panel by RT-PCR (Flu A&B, Covid) Nasopharyngeal Swab     Status: None   Collection Time: 08/04/20  8:30 PM   Specimen: Nasopharyngeal Swab; Nasopharyngeal(NP) swabs in vial transport medium  Result Value Ref Range Status   SARS Coronavirus 2 by RT PCR NEGATIVE NEGATIVE Final    Comment: (NOTE) SARS-CoV-2 target nucleic acids are NOT DETECTED.  The SARS-CoV-2 RNA is generally detectable in upper respiratory specimens during the acute phase of infection. The lowest concentration of SARS-CoV-2 viral copies  this assay can detect is 138 copies/mL. A negative result does not preclude SARS-Cov-2 infection and should not be used as the sole basis for treatment or other patient management decisions. A negative result may occur with  improper specimen collection/handling, submission of specimen other than nasopharyngeal swab, presence of viral mutation(s) within the areas targeted by this assay, and inadequate number of viral copies(<138 copies/mL). A negative result must be combined with clinical observations, patient history, and epidemiological information. The expected result is Negative.  Fact Sheet for Patients:  EntrepreneurPulse.com.au  Fact Sheet for Healthcare Providers:  IncredibleEmployment.be  This test is no t yet approved or cleared by the [place] FDA and  has been authorized for detection and/or diagnosis of SARS-CoV-2 by FDA under an Emergency Use Authorization (EUA). This EUA will remain  in effect (meaning this test can be used) for the duration of the COVID-19 declaration under Section 564(b)(1) of the Act, 21 U.S.C.section 360bbb-3(b)(1), unless the authorization is terminated  or revoked sooner.       Influenza A by PCR NEGATIVE NEGATIVE Final   Influenza B by PCR NEGATIVE NEGATIVE Final    Comment: (NOTE) The Xpert Xpress SARS-CoV-2/FLU/RSV plus assay is intended as an aid in the diagnosis of influenza from Nasopharyngeal swab specimens and should not be used as a sole basis for treatment. Nasal washings and aspirates are unacceptable for Xpert Xpress SARS-CoV-2/FLU/RSV testing.  Fact Sheet for Patients: EntrepreneurPulse.com.au  Fact Sheet for Healthcare Providers: IncredibleEmployment.be  This test is not yet approved or cleared by the [place] and has been authorized for detection and/or diagnosis of SARS-CoV-2 by FDA under an Emergency Use Authorization (EUA). This EUA  will remain in effect (meaning this test can be used) for the duration of the COVID-19 declaration under Section 564(b)(1) of the Act, 21 U.S.C. section 360bbb-3(b)(1), unless the authorization is terminated or revoked.  Performed at [facility], [place] [address], [place], [place] [address]      Time coordinating discharge: Over 30 minutes  SIGNED:   Ezekiel Slocumb, DO [facility] 08/12/2020, 11:18 AM   If 7PM-7AM, please contact night-coverage [email or web address]

## 2020-08-12 NOTE — Patient Outreach (Signed)
[facility]) Care Management  08/12/2020  Brittney Carter 08-13-1960 [id number]  Telephone Assessment-Enrollment-Other (PE recent hospitalization) Outreach #4 Initial referral received on 5/16 (d/c 5/14) however pt was readmitted 5/31-6/5   RN spoke with pt and introduced the [facility] program and available services. Explained the purpose for today's call and further engaged in pt's needs. Pt receptive as she explained her medical history and current issues related to her recently pulmonary embolism. Pt reports she has a pending appointment with her primary provider next Tuesday and Tamms has been involved with PT/RN this week. States she remains weak but continues to utilize [facility] for transportation services and has a very good support system from her son and daughter Brittney Carter.  Generated a plan of care that was discussed in detail and all notations within the plan were documented. Pt with clear understanding of the goals and all interventions along with possible barriers. Will follow up next month with pt as discussed. Pt did not wish to have a weekly transition of care call at this time. Will send all letters to pt and provider concerning pt's disposition with [facility] services. All questions and inquires address with no additional needs presented or to address at this time.  Goals Addressed            This Visit's Progress   . [place] My Quality of Life       Timeframe:  Long-Range Goal Priority:  Medium Start Date:   08/12/2020                          Expected End Date:   12/04/2020                   Follow Up Date 09/11/2020   - discuss my treatment options with the doctor or nurse - do one enjoyable thing every day - do something different, like talking to a new person or going to a new place, every day - make shared treatment decisions with doctor - spend time outdoors at least 3 times a week   Barriers: Health Behaviors  Why is this important?    Having a  long-term illness can be scary.   It can also be stressful for you and your caregiver.   These steps may help.    Notes:  6/8- Pt discussed her medical history with several back surgeries and her support system with her children. Pt also enjoys her two grandchildren. Pt lives in a mobile house and has supportive neighbors to assist when needed with her groceries and other needed items. Discussed the above goals and interventions with much encouraged for pt to participate in increasing her mobility if possible and cleared with her provider.    [place] and Keep All Appointments       Timeframe:  Short-Term Goal Priority:  Medium Start Date:  08/12/2020                           Expected End Date:   10/02/2020                    Follow Up Date 09/11/2020   - arrange a ride through an agency 1 week before appointment - ask family or friend for a ride - call to cancel if needed - keep a calendar with prescription refill dates - keep a calendar with appointment dates   Barriers:  Health Behaviors  Why is this important?    Part of staying healthy is seeing the doctor for follow-up care.   If you forget your appointments, there are some things you can do to stay on track.    Notes:  6/8-Verified pt uses [employer] for transportation and needs to call 3 days prior to her appointment date. Discussed options for a backup on transportation however mentioned she has a daughter Brittney Carter and a son who are able to assist if she needs additional assistance.        [name], RN Care Management Coordinator [place] Office [phone]

## 2020-08-12 NOTE — Patient Outreach (Signed)
[facility]) Care Management  08/11/2020  Brittney Carter [birth year] [id number]  Transition of care Outreach #3  RN attempted outreach call today however unsuccessful. RN able to leave a HIPAA approved voice message requesting a call back.  Will rescheduled another outreach call over the next month.   [name], RN Care Management Coordinator [place] Office [phone]

## 2020-08-13 ENCOUNTER — Telehealth: Payer: Self-pay

## 2020-08-13 DIAGNOSIS — F32A Depression, unspecified: Secondary | ICD-10-CM | POA: Diagnosis not present

## 2020-08-13 DIAGNOSIS — M1711 Unilateral primary osteoarthritis, right knee: Secondary | ICD-10-CM | POA: Diagnosis not present

## 2020-08-13 DIAGNOSIS — Z9181 History of falling: Secondary | ICD-10-CM | POA: Diagnosis not present

## 2020-08-13 DIAGNOSIS — Z7901 Long term (current) use of anticoagulants: Secondary | ICD-10-CM | POA: Diagnosis not present

## 2020-08-13 NOTE — Telephone Encounter (Signed)
Lana called requesting verbal orders for pt  1 time a week for 1 week  3 times a week for 2 weeks  2 times a week for 1 week   Call back # [phone]

## 2020-08-13 NOTE — Telephone Encounter (Signed)
IC verbal given.  

## 2020-08-17 ENCOUNTER — Other Ambulatory Visit: Payer: Self-pay | Admitting: *Deleted

## 2020-08-17 DIAGNOSIS — Z7901 Long term (current) use of anticoagulants: Secondary | ICD-10-CM | POA: Diagnosis not present

## 2020-08-17 DIAGNOSIS — M79604 Pain in right leg: Secondary | ICD-10-CM | POA: Diagnosis not present

## 2020-08-17 DIAGNOSIS — M1711 Unilateral primary osteoarthritis, right knee: Secondary | ICD-10-CM | POA: Diagnosis not present

## 2020-08-17 DIAGNOSIS — M25561 Pain in right knee: Secondary | ICD-10-CM | POA: Diagnosis not present

## 2020-08-17 DIAGNOSIS — Z743 Need for continuous supervision: Secondary | ICD-10-CM | POA: Diagnosis not present

## 2020-08-17 DIAGNOSIS — M961 Postlaminectomy syndrome, not elsewhere classified: Secondary | ICD-10-CM | POA: Diagnosis not present

## 2020-08-17 DIAGNOSIS — M7989 Other specified soft tissue disorders: Secondary | ICD-10-CM | POA: Diagnosis not present

## 2020-08-17 DIAGNOSIS — Z86711 Personal history of pulmonary embolism: Secondary | ICD-10-CM | POA: Diagnosis not present

## 2020-08-17 DIAGNOSIS — Z9181 History of falling: Secondary | ICD-10-CM | POA: Diagnosis not present

## 2020-08-17 DIAGNOSIS — I2699 Other pulmonary embolism without acute cor pulmonale: Secondary | ICD-10-CM | POA: Diagnosis not present

## 2020-08-17 DIAGNOSIS — R079 Chest pain, unspecified: Secondary | ICD-10-CM | POA: Diagnosis not present

## 2020-08-17 DIAGNOSIS — G8929 Other chronic pain: Secondary | ICD-10-CM | POA: Diagnosis not present

## 2020-08-17 DIAGNOSIS — F32A Depression, unspecified: Secondary | ICD-10-CM | POA: Diagnosis not present

## 2020-08-17 DIAGNOSIS — J90 Pleural effusion, not elsewhere classified: Secondary | ICD-10-CM | POA: Diagnosis not present

## 2020-08-17 DIAGNOSIS — R6889 Other general symptoms and signs: Secondary | ICD-10-CM | POA: Diagnosis not present

## 2020-08-17 DIAGNOSIS — J9811 Atelectasis: Secondary | ICD-10-CM | POA: Diagnosis not present

## 2020-08-17 DIAGNOSIS — R0789 Other chest pain: Secondary | ICD-10-CM | POA: Diagnosis not present

## 2020-08-17 DIAGNOSIS — Z79891 Long term (current) use of opiate analgesic: Secondary | ICD-10-CM | POA: Diagnosis not present

## 2020-08-17 DIAGNOSIS — M545 Low back pain, unspecified: Secondary | ICD-10-CM | POA: Diagnosis not present

## 2020-08-17 DIAGNOSIS — R0902 Hypoxemia: Secondary | ICD-10-CM | POA: Diagnosis not present

## 2020-08-17 DIAGNOSIS — R0602 Shortness of breath: Secondary | ICD-10-CM | POA: Diagnosis not present

## 2020-08-17 DIAGNOSIS — G894 Chronic pain syndrome: Secondary | ICD-10-CM | POA: Diagnosis not present

## 2020-08-17 NOTE — Patient Outreach (Signed)
[facility]) Care Management  08/17/2020  JANNETTE COTHAM 1960-10-03 [id number]    EMMI-GENERAL DISCHARGE-UNSUCCESSFUL RED ON EMMI ALERT Day #4 Date:08/14/2020 Red Alert Reason: LOST OF INTEREST, SAD, HOPELESS, ANXIOUS OR EMPTY.  Will rescheduled another outreach to verified EMMI and offered needed assistance over the next week.  [name], RN Care Management Coordinator [place] Office [phone]

## 2020-08-18 ENCOUNTER — Other Ambulatory Visit: Payer: Self-pay | Admitting: *Deleted

## 2020-08-18 DIAGNOSIS — R9431 Abnormal electrocardiogram [ECG] [EKG]: Secondary | ICD-10-CM | POA: Diagnosis not present

## 2020-08-18 DIAGNOSIS — M1711 Unilateral primary osteoarthritis, right knee: Secondary | ICD-10-CM | POA: Diagnosis not present

## 2020-08-18 DIAGNOSIS — Z7901 Long term (current) use of anticoagulants: Secondary | ICD-10-CM | POA: Diagnosis not present

## 2020-08-18 DIAGNOSIS — Z9181 History of falling: Secondary | ICD-10-CM | POA: Diagnosis not present

## 2020-08-18 DIAGNOSIS — F32A Depression, unspecified: Secondary | ICD-10-CM | POA: Diagnosis not present

## 2020-08-18 NOTE — Patient Outreach (Signed)
[facility]) Care Management  08/18/2020  Brittney Carter 1961/02/13 [id number]   EMMI-GENERAL DISCHARGE-SUCCESSFUL (pending counseling) RED ON EMMI ALERT Day #4 Date:08/14/2020 Red Alert Reason: LOST OF INTEREST, SAD, HOPELESS, EMPTY  OUTREACH #2 RN spoke with pt concerning the above emmi. Pt states she is taken antidepressant medication however feels like she need counseling. RN verified pt is not having sucidial thoughts and aware when to take her medications. Pt is not in a crises and had a visiting RN via [place] visit on yesterday with recommendations to call "[facility]" in [place] and make an appointment for individualized counseling. Pt awaiting [facility] center to call back for services. RN also offered CSW via [facility] if her appointments is scheduled out far if immediate counseling is needed. Pt is already aware of [facility] if needed.   No additional issues and RN will remain available if additional assistance is needed. Will follow up next month with ongoing [facility] services for management of care.  [name], RN Care Management Coordinator [place] Office [phone]

## 2020-08-19 ENCOUNTER — Ambulatory Visit: Payer: Self-pay | Admitting: *Deleted

## 2020-08-19 DIAGNOSIS — Z9181 History of falling: Secondary | ICD-10-CM | POA: Diagnosis not present

## 2020-08-19 DIAGNOSIS — Z7901 Long term (current) use of anticoagulants: Secondary | ICD-10-CM | POA: Diagnosis not present

## 2020-08-19 DIAGNOSIS — M1711 Unilateral primary osteoarthritis, right knee: Secondary | ICD-10-CM | POA: Diagnosis not present

## 2020-08-19 DIAGNOSIS — F32A Depression, unspecified: Secondary | ICD-10-CM | POA: Diagnosis not present

## 2020-08-20 DIAGNOSIS — M25461 Effusion, right knee: Secondary | ICD-10-CM | POA: Diagnosis not present

## 2020-08-20 DIAGNOSIS — R0602 Shortness of breath: Secondary | ICD-10-CM | POA: Diagnosis not present

## 2020-08-20 DIAGNOSIS — M79604 Pain in right leg: Secondary | ICD-10-CM | POA: Diagnosis not present

## 2020-08-20 DIAGNOSIS — M79661 Pain in right lower leg: Secondary | ICD-10-CM | POA: Diagnosis not present

## 2020-08-20 DIAGNOSIS — R600 Localized edema: Secondary | ICD-10-CM | POA: Diagnosis not present

## 2020-08-20 DIAGNOSIS — M25561 Pain in right knee: Secondary | ICD-10-CM | POA: Diagnosis not present

## 2020-08-20 DIAGNOSIS — Z7689 Persons encountering health services in other specified circumstances: Secondary | ICD-10-CM | POA: Diagnosis not present

## 2020-08-21 ENCOUNTER — Ambulatory Visit: Payer: Medicare Other | Admitting: Orthopedic Surgery

## 2020-08-21 ENCOUNTER — Telehealth: Payer: Self-pay | Admitting: Orthopedic Surgery

## 2020-08-21 NOTE — Telephone Encounter (Signed)
scheduled

## 2020-08-21 NOTE — Telephone Encounter (Signed)
Pt called asking about making an appt to drain knee fluid. The soonest we had for [facility] or Marlou Sa was 6/27 so she made an appt for that day but wanted to ask did she need to get worked in sooner if its related to getting fluid drained (pt called yesterday and stated she was transferred to triage but was told she would be fine until the 27th). The best call back number is [phone].

## 2020-08-24 ENCOUNTER — Telehealth: Payer: Self-pay | Admitting: Surgical

## 2020-08-24 ENCOUNTER — Telehealth: Payer: Self-pay

## 2020-08-24 DIAGNOSIS — M1711 Unilateral primary osteoarthritis, right knee: Secondary | ICD-10-CM | POA: Diagnosis not present

## 2020-08-24 DIAGNOSIS — Z9181 History of falling: Secondary | ICD-10-CM | POA: Diagnosis not present

## 2020-08-24 DIAGNOSIS — Z7901 Long term (current) use of anticoagulants: Secondary | ICD-10-CM | POA: Diagnosis not present

## 2020-08-24 DIAGNOSIS — F32A Depression, unspecified: Secondary | ICD-10-CM | POA: Diagnosis not present

## 2020-08-24 NOTE — Telephone Encounter (Signed)
Patient called office on-call number after hours.  Called patient back to discuss her complaint.  She complains of increased pain in the right lower extremity consistent with the same pain she has been dealing with over the last several weeks.  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  She is still on Eliquis and is compliant with taking the medication.  She notes some wheezing but denies any chest pain or shortness of breath.  She is talking in full sentences without difficulty.  She did send some pictures of her leg through text that were reviewed and do show some swelling of the right lower extremity compared to the left.  She is concerned that the right knee needs to be aspirated.  She was seen in the ER about a week ago with a new CT scan that demonstrated resolution of the pulmonary emboli.  Given her history as well as her currently stable state, recommended either going to the emergency department today for likely repeat ultrasound to see how the DVT has progressed versus clinical evaluation by Dr. Junius Roads at her scheduled appointment tomorrow.  She does not want to go to the ER today and would rather follow-up with Dr. Junius Roads tomorrow for decision point on next steps.  Recommended she call 911 if she develops any chest pain or acute difficulty breathing.  She understands and agrees with this plan.

## 2020-08-24 NOTE — Telephone Encounter (Signed)
IC no answer. LMVM advising worked in to see Dr Junius Roads tomorrow as Dr Marlou Sa is in surgery

## 2020-08-24 NOTE — Telephone Encounter (Signed)
Patient called she stated her right foot and leg is swelling up again, patient stated she is in pain and stated this happened previously and found out she had a blood clot patient is requesting to be worked into Dr.Deans schedule if possible call back:[phone]

## 2020-08-25 ENCOUNTER — Emergency Department (HOSPITAL_COMMUNITY)
Admission: EM | Admit: 2020-08-25 | Discharge: 2020-08-25 | Disposition: A | Discharge status: Home / Self Care | Payer: Medicare Other | Attending: Emergency Medicine | Admitting: Emergency Medicine

## 2020-08-25 ENCOUNTER — Other Ambulatory Visit: Payer: Self-pay

## 2020-08-25 ENCOUNTER — Encounter (HOSPITAL_COMMUNITY): Payer: Self-pay | Admitting: *Deleted

## 2020-08-25 ENCOUNTER — Emergency Department (HOSPITAL_COMMUNITY)
Admit: 2020-08-25 | Discharge: 2020-08-25 | Disposition: A | Discharge status: Home / Self Care | Payer: Medicare Other | Attending: Emergency Medicine | Admitting: Emergency Medicine

## 2020-08-25 ENCOUNTER — Ambulatory Visit: Payer: Medicare Other | Admitting: Family Medicine

## 2020-08-25 DIAGNOSIS — Z7901 Long term (current) use of anticoagulants: Secondary | ICD-10-CM | POA: Diagnosis not present

## 2020-08-25 DIAGNOSIS — Z86718 Personal history of other venous thrombosis and embolism: Secondary | ICD-10-CM | POA: Insufficient documentation

## 2020-08-25 DIAGNOSIS — R609 Edema, unspecified: Secondary | ICD-10-CM | POA: Diagnosis not present

## 2020-08-25 DIAGNOSIS — M7989 Other specified soft tissue disorders: Secondary | ICD-10-CM | POA: Insufficient documentation

## 2020-08-25 MED ORDER — HYDROCODONE-ACETAMINOPHEN 10-325 MG PO TABS: 1.0000 | ORAL_TABLET | Freq: Four times a day (QID) | ORAL | 0 refills | Status: DC | PRN

## 2020-08-25 MED ORDER — OXYCODONE-ACETAMINOPHEN 10-325 MG PO TABS: 1.0000 | ORAL_TABLET | Freq: Four times a day (QID) | ORAL | 0 refills | Status: DC | PRN

## 2020-08-25 NOTE — Discharge Instructions (Addendum)
Please take the Vicodin as needed for severe breakthrough pain, only if discomfort not well controlled with leg elevation and Tylenol.  Continue taking her Eliquis, as directed.  Go to your appointment with orthopedics in 2 days, as scheduled.  Your DVT study was without any progression or worsening in your known blood clot.  Return to the ER seek immediate medical attention should you experience any new or worsening symptoms.  Specifically, any chest pain, shortness of breath, difficulty breathing, or syncope.

## 2020-08-25 NOTE — ED Triage Notes (Signed)
Pt complains of right lower leg and foot swelling x 3 days. She has hx of blood clots, is taking eliquis since being discharged.

## 2020-08-25 NOTE — ED Provider Notes (Signed)
[facility] DEPT Provider Note   CSN: [id number] Arrival date & time: 08/25/20  1259     History Chief Complaint  Patient presents with   Leg Swelling    Brittney Carter is a 60 y.o. female with past medical history significant for acute pulmonary emboli and right lower extremity DVT admitted to the hospital 08/04/2020 who presents the ED with complaints of right lower leg and foot swelling x3 days.  Patient notes that 2 days ago she went shopping with her daughter and when she came home she had experienced significant right lower leg swelling and edema.  She states this is relatively new.  She also has ongoing knee discomfort for which she sees Dr. Marlou Sa.  She reports that she likely needs therapeutic arthrocentesis.  However, given her new right lower extremity swelling, she was advised to come to the ED for repeat ultrasound to ensure that there is no progression.  She reports that she has been taking her Eliquis, as directed.  She also denies any chest pain, palpitations, shortness of breath symptoms.  She is simply scared given the new swelling.   HPI     Past Medical History:  Diagnosis Date   Arthritis    Back   Depression    no medication   Dyspnea    with exertion     Patient Active Problem List   Diagnosis Date Noted   Acute deep vein thrombosis (DVT) of distal vein of right lower extremity (HCC)    Acute pulmonary embolism (HCC) 08/04/2020   Back pain of thoracolumbar region 10/17/2017   Long-term current use of opiate analgesic 03/28/2017   Chronic pain 11/24/2016   Cervical radiculopathy at C7 12/03/2015   Sacroiliac joint pain 12/03/2015   Chronic low back pain 11/25/2014   Segmental and somatic dysfunction of pelvic region 10/28/2014   Disc degeneration, lumbar 08/12/2014   Lumbar radiculopathy 08/12/2014   Lumbar stenosis 08/12/2014   Osteoarthritis of spine with radiculopathy, lumbar region 08/12/2014    Past Surgical  History:  Procedure Laterality Date   BACK SURGERY  2016   Laminectomy   BACK SURGERY  1991   Disectomy   BREAST SURGERY     Breast lift   COLONOSCOPY W/ POLYPECTOMY     x 2   Pain stimulator trail     SPINAL CORD STIMULATOR INSERTION N/A 11/24/2016   Procedure: LUMBAR SPINAL CORD STIMULATOR INSERTION;  Surgeon: Melina Schools, MD;  Location: [facility];  Service: Orthopedics;  Laterality: N/A;  2.5 hrs   SPINAL CORD STIMULATOR REMOVAL N/A 06/28/2017   Procedure: Spinal cord stimulator battery removal;  Surgeon: Melina Schools, MD;  Location: [facility];  Service: Orthopedics;  Laterality: N/A;   TONSILLECTOMY     age 89     OB History   No obstetric history on file.     No family history on file.  Social History   Tobacco Use   Smoking status: Never   Smokeless tobacco: Never  Vaping Use   Vaping Use: Never used  Substance Use Topics   Alcohol use: No   Drug use: No    Home Medications Prior to Admission medications   Medication Sig Start Date End Date Taking? Authorizing Provider  HYDROcodone-acetaminophen (NORCO) 10-325 MG tablet Take 1 tablet by mouth every 6 (six) hours as needed for severe pain. 08/25/20  Yes Corena Herter, PA-C  apixaban (ELIQUIS) 5 MG TABS tablet Take 2 tablets (10 mg total) by mouth  2 (two) times daily for 6 days, THEN 1 tablet (5 mg total) 2 (two) times daily for 24 days. 08/09/20 09/08/20  Ezekiel Slocumb, DO  Artificial Tear Solution (SYSTANE CONTACTS OP) Place 1 drop into both eyes daily as needed (dry eyes).    [provider]  diclofenac Sodium (VOLTAREN) 1 % GEL Apply 2 g topically 4 (four) times daily as needed (right knee pain).    [provider]  escitalopram (LEXAPRO) 10 MG tablet Take 15 mg by mouth daily. 06/30/20   [provider]  hydrocortisone cream 1 % Apply topically 3 (three) times daily. 08/09/20   Nicole Kindred A, DO  magnesium oxide (MAG-OX) 400 (240 Mg) MG tablet Take 1 tablet (400 mg total) by mouth 2  (two) times daily as needed (leg cramp/spasms). 08/09/20   Ezekiel Slocumb, DO  naloxone [facility] nasal spray 4 mg/0.1 mL Place 4 mg into the nose as needed (overdose). 04/04/19   [provider]  traZODone (DESYREL) 50 MG tablet Take 50-100 mg by mouth at bedtime. 07/03/20   [provider]    Allergies    Morphine, Tramadol, and Gabapentin  Review of Systems   Review of Systems  All other systems reviewed and are negative.  Physical Exam Updated Vital Signs BP 129/78   Pulse 61   Temp 98.7 F (37.1 C) (Oral)   Resp 15   SpO2 95%   Physical Exam Vitals and nursing note reviewed. Exam conducted with a chaperone present.  Constitutional:      Appearance: Normal appearance.  HENT:     Head: Normocephalic and atraumatic.  Eyes:     General: No scleral icterus.    Conjunctiva/sclera: Conjunctivae normal.  Cardiovascular:     Rate and Rhythm: Normal rate.     Pulses: Normal pulses.  Pulmonary:     Effort: Pulmonary effort is normal. No respiratory distress.  Musculoskeletal:     Right lower leg: Edema present.     Comments: Right lower extremity swelling and edema relative to the left leg.  No erythema, warmth, or other cellulitic changes.  Pulse intact and symmetric.  Sensation intact throughout.  Likely right knee effusion (she reports chronic).  Skin:    General: Skin is dry.  Neurological:     Mental Status: She is alert.     GCS: GCS eye subscore is 4. GCS verbal subscore is 5. GCS motor subscore is 6.  Psychiatric:        Mood and Affect: Mood normal.        Behavior: Behavior normal.        Thought Content: Thought content normal.    ED Results / Procedures / Treatments   Labs (all labs ordered are listed, but only abnormal results are displayed) Labs Reviewed - No data to display  EKG None  Radiology VAS [place] LOWER EXTREMITY VENOUS (DVT) (ONLY [facility] & [facility] 7a-7p)  Result Date: 08/25/2020  Lower Venous DVT Study Patient Name:  Brittney Carter   Date of Exam:   08/25/2020 Medical Rec #: [id number]          Accession #:    [id number] Date of Birth: 1960-04-23          Patient Gender: F Patient Age:   23Y Exam Location:  [facility] Procedure:      VAS [place] LOWER EXTREMITY VENOUS (DVT) Referring Phys: [id number] WHITNEY PLUNKETT --------------------------------------------------------------------------------  Indications: Swelling, and Pain.  Risk Factors: DVT. Limitations: Body habitus  and poor ultrasound/tissue interface. Comparison Study: 08/04/2020 - RIGHT:                   - Findings consistent with acute deep vein thrombosis                   involving the right popliteal vein, right posterior tibial                   veins, and right peroneal veins.                   - No cystic structure found in the popliteal fossa.                    LEFT:                   - No evidence of common femoral vein obstruction. Performing Technologist: Oliver Hum RVT  Examination Guidelines: A complete evaluation includes B-mode imaging, spectral Doppler, color Doppler, and power Doppler as needed of all accessible portions of each vessel. Bilateral testing is considered an integral part of a complete examination. Limited examinations for reoccurring indications may be performed as noted. The reflux portion of the exam is performed with the patient in reverse Trendelenburg.  +---------+---------------+---------+-----------+----------+-----------------+ RIGHT    CompressibilityPhasicitySpontaneityPropertiesThrombus Aging    +---------+---------------+---------+-----------+----------+-----------------+ CFV      Full           Yes      Yes                                    +---------+---------------+---------+-----------+----------+-----------------+ SFJ      Full                                                           +---------+---------------+---------+-----------+----------+-----------------+ FV Prox  Full                                                            +---------+---------------+---------+-----------+----------+-----------------+ FV Mid   Full                                                           +---------+---------------+---------+-----------+----------+-----------------+ FV Distal               Yes      Yes                                    +---------+---------------+---------+-----------+----------+-----------------+ PFV      Full                                                           +---------+---------------+---------+-----------+----------+-----------------+  POP      Partial        Yes      Yes                  Age Indeterminate +---------+---------------+---------+-----------+----------+-----------------+ PTV      Full                                                           +---------+---------------+---------+-----------+----------+-----------------+ PERO     Full                                                           +---------+---------------+---------+-----------+----------+-----------------+ Duplicate popliteal vein with occlusive thrombus.  +----+---------------+---------+-----------+----------+--------------+ LEFTCompressibilityPhasicitySpontaneityPropertiesThrombus Aging +----+---------------+---------+-----------+----------+--------------+ CFV Full           Yes      Yes                                 +----+---------------+---------+-----------+----------+--------------+    Summary: RIGHT: - Findings consistent with age indeterminate deep vein thrombosis involving the right popliteal vein. - No cystic structure found in the popliteal fossa. - Duplicate popliteal vein with occlusive thrombus.  LEFT: - No evidence of common femoral vein obstruction.  *See table(s) above for measurements and observations.    Preliminary     Procedures Procedures   Medications Ordered in ED Medications - No data to display  ED Course  I have reviewed the triage  vital signs and the nursing notes.  Pertinent labs & imaging results that were available during my care of the patient were reviewed by me and considered in my medical decision making (see chart for details).    MDM Rules/Calculators/A&P                          Brittney Carter was evaluated in Emergency Department on 08/25/2020 for the symptoms described in the history of present illness. She was evaluated in the context of the global COVID-19 pandemic, which necessitated consideration that the patient might be at risk for infection with the SARS-CoV-2 virus that causes COVID-19. Institutional protocols and algorithms that pertain to the evaluation of patients at risk for COVID-19 are in a state of rapid change based on information released by regulatory bodies including the CDC and federal and state organizations. These policies and algorithms were followed during the patient's care in the ED.  I personally reviewed patient's medical chart and all notes from triage and staff during today's encounter. I have also ordered and reviewed all labs and imaging that I felt to be medically necessary in the evaluation of this patient's complaints and with consideration of their physical exam. If needed, translation services were available and utilized.   DVT study shows that DVT is unchanged.  No extension.  Continue with at home Eliquis, as directed.  Likely venous insufficiency subsequent to the deep venous thrombosis.  Compartments are soft.  No cellulitic findings.  Discussed case with Dr. Maryan Rued who had personally evaluated patient.  Will recommend compression stockings and continued outpatient follow-up.  She states that she has been taking her 10-325 [place], as directed.  She reports that she is all out.  She has an appointment with orthopedist on 08/27/2020 and simply needs continued pain medications in the interim.  ER return precautions discussed.  Patient voices understanding is agreeable to the  plan.   Final Clinical Impression(s) / ED Diagnoses Final diagnoses:  Leg swelling    Rx / DC Orders ED Discharge Orders          Ordered    HYDROcodone-acetaminophen (NORCO) 10-325 MG tablet  Every 6 hours PRN        08/25/20 1538    Compression stockings        08/25/20 1538             Corena Herter, PA-C 08/25/20 1539    Carmin Muskrat, MD 08/25/20 [phone]

## 2020-08-27 ENCOUNTER — Ambulatory Visit (INDEPENDENT_AMBULATORY_CARE_PROVIDER_SITE_OTHER): Payer: Medicare Other | Admitting: Orthopedic Surgery

## 2020-08-27 ENCOUNTER — Encounter: Payer: Self-pay | Admitting: Orthopedic Surgery

## 2020-08-27 ENCOUNTER — Other Ambulatory Visit: Payer: Self-pay

## 2020-08-27 DIAGNOSIS — M25461 Effusion, right knee: Secondary | ICD-10-CM | POA: Diagnosis not present

## 2020-08-27 MED ORDER — OXYCODONE HCL 5 MG PO CAPS: 5.0000 mg | ORAL_CAPSULE | Freq: Two times a day (BID) | ORAL | 0 refills | Status: DC | PRN

## 2020-08-27 NOTE — Progress Notes (Signed)
Office Visit Note   Patient: Brittney Carter           Date of Birth: 1960/12/08           MRN: [id number] Visit Date: 08/27/2020 Requested by: [name], MD [address] [place],  [place] [address] PCP: [name], MD  Subjective: Chief Complaint  Patient presents with   Right Leg - Pain, Edema    HPI: Brittney Carter is a 60 y.o. female who presents to the office complaining of right leg pain.  Patient returns complaining of continued right knee pain and right leg pain.  She has history of recent DVT with increased swelling in the right leg over the last several days since she went shopping for an hour with her daughter.  She has been using compression stockings but does not have them on today.  She has 8-15 compression stockings that go up to her knee.  She denies any chest pain, shortness of breath, difficulty breathing while talking.  She is seen back in the emergency department on 6/21 where a new ultrasound was taken that demonstrated deep vein thrombosis in the right lower extremity..                ROS: All systems reviewed are negative as they relate to the chief complaint within the history of present illness.  Patient denies fevers or chills.  Assessment & Plan: Visit Diagnoses:  1. Effusion, right knee     Plan: Patient is a 60 year old female with history of recent DVT who returns for evaluation of right lower extremity pain.  She has had increasing swelling in her right lower extremity since she was up and walking while shopping with her daughter.  She has been compliant with taking her Eliquis.  With increased pain and swelling over the last several days, plan to continue with compression stockings but gave her a new prescription for 20-30 thigh-high compression stockings instead of the 8-15 knee-high stockings that she is using.  Plan to have her follow-up with her primary care physician next week as she is running low on Eliquis.  Follow-up with  Dr. Marlou Sa in 6 weeks for clinical recheck.  May consider knee aspiration at that time.  Follow-Up Instructions: No follow-ups on file.   Orders:  No orders of the defined types were placed in this encounter.  Meds ordered this encounter  Medications   oxycodone (OXY-IR) 5 MG capsule    Sig: Take 1 capsule (5 mg total) by mouth every 12 (twelve) hours as needed.    Dispense:  12 capsule    Refill:  0      Procedures: No procedures performed   Clinical Data: No additional findings.  Objective: Vital Signs: There were no vitals taken for this visit.  Physical Exam:  Constitutional: Patient appears well-developed HEENT:  Head: Normocephalic Eyes:EOM are normal Neck: Normal range of motion Cardiovascular: Normal rate Pulmonary/chest: Effort normal Neurologic: Patient is alert Skin: Skin is warm Psychiatric: Patient has normal mood and affect  Ortho Exam: Ortho exam demonstrates diffuse swelling through the right lower extremity that is asymmetric compared with the left.  Diffuse tenderness throughout the right calf.  Moderate knee effusion on the right.  1+ DP pulse.  No cyanosis noted.  No evidence of cellulitis.  Specialty Comments:  No specialty comments available.  Imaging: No results found.   PMFS History: Patient Active Problem List   Diagnosis Date Noted   Acute deep  vein thrombosis (DVT) of distal vein of right lower extremity (HCC)    Acute pulmonary embolism ([place]) 08/04/2020   Back pain of thoracolumbar region 10/17/2017   Long-term current use of opiate analgesic 03/28/2017   Chronic pain 11/24/2016   Cervical radiculopathy at C7 12/03/2015   Sacroiliac joint pain 12/03/2015   Chronic low back pain 11/25/2014   Segmental and somatic dysfunction of pelvic region 10/28/2014   Disc degeneration, lumbar 08/12/2014   Lumbar radiculopathy 08/12/2014   Lumbar stenosis 08/12/2014   Osteoarthritis of spine with radiculopathy, lumbar region 08/12/2014   Past  Medical History:  Diagnosis Date   Arthritis    Back   Depression    no medication   Dyspnea    with exertion     No family history on file.  Past Surgical History:  Procedure Laterality Date   BACK SURGERY  2016   Laminectomy   BACK SURGERY  1991   Disectomy   BREAST SURGERY     Breast lift   COLONOSCOPY W/ POLYPECTOMY     x 2   Pain stimulator trail     SPINAL CORD STIMULATOR INSERTION N/A 11/24/2016   Procedure: LUMBAR SPINAL CORD STIMULATOR INSERTION;  Surgeon: Melina Schools, MD;  Location: [facility];  Service: Orthopedics;  Laterality: N/A;  2.5 hrs   SPINAL CORD STIMULATOR REMOVAL N/A 06/28/2017   Procedure: Spinal cord stimulator battery removal;  Surgeon: Melina Schools, MD;  Location: [facility];  Service: Orthopedics;  Laterality: N/A;   TONSILLECTOMY     age 10   Social History   Occupational History   Not on file  Tobacco Use   Smoking status: Never   Smokeless tobacco: Never  Vaping Use   Vaping Use: Never used  Substance and Sexual Activity   Alcohol use: No   Drug use: No   Sexual activity: Not on file

## 2020-08-31 ENCOUNTER — Other Ambulatory Visit: Payer: Self-pay

## 2020-08-31 ENCOUNTER — Emergency Department (HOSPITAL_COMMUNITY)
Admission: EM | Admit: 2020-08-31 | Discharge: 2020-09-01 | Disposition: A | Discharge status: Home / Self Care | Payer: Medicare Other | Attending: Emergency Medicine | Admitting: Emergency Medicine

## 2020-08-31 ENCOUNTER — Ambulatory Visit: Payer: Medicare Other | Admitting: Orthopedic Surgery

## 2020-08-31 ENCOUNTER — Emergency Department (HOSPITAL_COMMUNITY): Payer: Medicare Other

## 2020-08-31 ENCOUNTER — Encounter (HOSPITAL_COMMUNITY): Payer: Self-pay

## 2020-08-31 DIAGNOSIS — Z7901 Long term (current) use of anticoagulants: Secondary | ICD-10-CM | POA: Diagnosis not present

## 2020-08-31 DIAGNOSIS — R0602 Shortness of breath: Secondary | ICD-10-CM | POA: Diagnosis not present

## 2020-08-31 DIAGNOSIS — R509 Fever, unspecified: Secondary | ICD-10-CM | POA: Insufficient documentation

## 2020-08-31 DIAGNOSIS — R059 Cough, unspecified: Secondary | ICD-10-CM | POA: Insufficient documentation

## 2020-08-31 DIAGNOSIS — R079 Chest pain, unspecified: Secondary | ICD-10-CM | POA: Diagnosis not present

## 2020-08-31 DIAGNOSIS — F332 Major depressive disorder, recurrent severe without psychotic features: Secondary | ICD-10-CM | POA: Diagnosis present

## 2020-08-31 DIAGNOSIS — F32A Depression, unspecified: Secondary | ICD-10-CM

## 2020-08-31 DIAGNOSIS — R42 Dizziness and giddiness: Secondary | ICD-10-CM | POA: Insufficient documentation

## 2020-08-31 DIAGNOSIS — M791 Myalgia, unspecified site: Secondary | ICD-10-CM | POA: Insufficient documentation

## 2020-08-31 DIAGNOSIS — R5383 Other fatigue: Secondary | ICD-10-CM | POA: Diagnosis not present

## 2020-08-31 DIAGNOSIS — R45851 Suicidal ideations: Secondary | ICD-10-CM | POA: Diagnosis not present

## 2020-08-31 DIAGNOSIS — R069 Unspecified abnormalities of breathing: Secondary | ICD-10-CM | POA: Diagnosis not present

## 2020-08-31 DIAGNOSIS — R0789 Other chest pain: Secondary | ICD-10-CM | POA: Diagnosis not present

## 2020-08-31 DIAGNOSIS — Z20822 Contact with and (suspected) exposure to covid-19: Secondary | ICD-10-CM | POA: Insufficient documentation

## 2020-08-31 DIAGNOSIS — Y900 Blood alcohol level of less than 20 mg/100 ml: Secondary | ICD-10-CM | POA: Diagnosis not present

## 2020-08-31 DIAGNOSIS — M79605 Pain in left leg: Secondary | ICD-10-CM | POA: Insufficient documentation

## 2020-08-31 DIAGNOSIS — G8929 Other chronic pain: Secondary | ICD-10-CM | POA: Diagnosis not present

## 2020-08-31 DIAGNOSIS — R404 Transient alteration of awareness: Secondary | ICD-10-CM | POA: Diagnosis not present

## 2020-08-31 DIAGNOSIS — Z743 Need for continuous supervision: Secondary | ICD-10-CM | POA: Diagnosis not present

## 2020-08-31 LAB — COMPREHENSIVE METABOLIC PANEL
ALT: 17 U/L (ref 0–44)
AST: 22 U/L (ref 15–41)
Albumin: 4 g/dL (ref 3.5–5.0)
Alkaline Phosphatase: 51 U/L (ref 38–126)
Anion gap: 8 (ref 5–15)
BUN: 12 mg/dL (ref 6–20)
CO2: 25 mmol/L (ref 22–32)
Calcium: 9.6 mg/dL (ref 8.9–10.3)
Chloride: 107 mmol/L (ref 98–111)
Creatinine, Ser: 0.74 mg/dL (ref 0.44–1.00)
GFR, Estimated: >60 mL/min (ref >60)
Glucose, Bld: 115 mg/dL — ABNORMAL HIGH (ref 70–99)
Potassium: 3.8 mmol/L (ref 3.5–5.1)
Sodium: 140 mmol/L (ref 135–145)
Total Bilirubin: 0.7 mg/dL (ref 0.3–1.2)
Total Protein: 7.4 g/dL (ref 6.5–8.1)

## 2020-08-31 LAB — CBC WITH DIFFERENTIAL/PLATELET
Abs Immature Granulocytes: 0.02 10*3/uL (ref 0.00–0.07)
Basophils Absolute: 0.1 10*3/uL (ref 0.0–0.1)
Basophils Relative: 1 %
Eosinophils Absolute: 0 10*3/uL (ref 0.0–0.5)
Eosinophils Relative: 0 %
HCT: 36.6 % (ref 36.0–46.0)
Hemoglobin: 11.6 g/dL — ABNORMAL LOW (ref 12.0–15.0)
Immature Granulocytes: 0 %
Lymphocytes Relative: 22 %
Lymphs Abs: 1.4 10*3/uL (ref 0.7–4.0)
MCH: 26.2 pg (ref 26.0–34.0)
MCHC: 31.7 g/dL (ref 30.0–36.0)
MCV: 82.8 fL (ref 80.0–100.0)
Monocytes Absolute: 0.4 10*3/uL (ref 0.1–1.0)
Monocytes Relative: 7 %
Neutro Abs: 4.4 10*3/uL (ref 1.7–7.7)
Neutrophils Relative %: 70 %
Platelets: 314 10*3/uL (ref 150–400)
RBC: 4.42 MIL/uL (ref 3.87–5.11)
RDW: 15.2 % (ref 11.5–15.5)
WBC: 6.4 10*3/uL (ref 4.0–10.5)
nRBC: 0 % (ref 0.0–0.2)

## 2020-08-31 LAB — ACETAMINOPHEN LEVEL: Acetaminophen (Tylenol), Serum: <10 ug/mL — ABNORMAL LOW (ref 10–30)

## 2020-08-31 LAB — ETHANOL: Alcohol, Ethyl (B): <10 mg/dL (ref <10)

## 2020-08-31 LAB — RAPID URINE DRUG SCREEN, HOSP PERFORMED
Amphetamines: NOT DETECTED
Barbiturates: NOT DETECTED
Benzodiazepines: NOT DETECTED
Cocaine: NOT DETECTED
Opiates: NOT DETECTED
Tetrahydrocannabinol: NOT DETECTED

## 2020-08-31 LAB — URINALYSIS, ROUTINE W REFLEX MICROSCOPIC
Bilirubin Urine: NEGATIVE
Glucose, UA: NEGATIVE mg/dL
Hgb urine dipstick: NEGATIVE
Ketones, ur: NEGATIVE mg/dL
Leukocytes,Ua: NEGATIVE
Nitrite: NEGATIVE
Protein, ur: NEGATIVE mg/dL
Specific Gravity, Urine: 1.016 (ref 1.005–1.030)
pH: 8 (ref 5.0–8.0)

## 2020-08-31 LAB — SALICYLATE LEVEL: Salicylate Lvl: <7 mg/dL — ABNORMAL LOW (ref 7.0–30.0)

## 2020-08-31 LAB — RESP PANEL BY RT-PCR (FLU A&B, COVID) ARPGX2
Influenza A by PCR: NEGATIVE
Influenza B by PCR: NEGATIVE
SARS Coronavirus 2 by RT PCR: NEGATIVE

## 2020-08-31 LAB — TROPONIN I (HIGH SENSITIVITY): Troponin I (High Sensitivity): <2 ng/L (ref <18)

## 2020-08-31 MED ORDER — ACETAMINOPHEN 500 MG PO TABS
1000.0000 mg | ORAL_TABLET | Freq: Once | ORAL | Status: AC
  Administered 2020-08-31: 1000 mg via ORAL
  Filled 2020-08-31: qty 2

## 2020-08-31 MED ORDER — DICLOFENAC SODIUM 1 % EX GEL
2.0000 g | Freq: Four times a day (QID) | CUTANEOUS | Status: DC
  Administered 2020-08-31 – 2020-09-01 (×2): 2 g via TOPICAL
  Filled 2020-08-31 (×2): qty 100

## 2020-08-31 MED ORDER — ESCITALOPRAM OXALATE 10 MG PO TABS
15.0000 mg | ORAL_TABLET | Freq: Every day | ORAL | Status: DC
  Administered 2020-08-31 – 2020-09-01 (×2): 15 mg via ORAL
  Filled 2020-08-31 (×2): qty 2

## 2020-08-31 MED ORDER — TRAZODONE HCL 100 MG PO TABS
50.0000 mg | ORAL_TABLET | Freq: Every day | ORAL | Status: DC
  Administered 2020-08-31: 50 mg via ORAL
  Filled 2020-08-31: qty 1

## 2020-08-31 MED ORDER — APIXABAN 5 MG PO TABS
5.0000 mg | ORAL_TABLET | Freq: Two times a day (BID) | ORAL | Status: DC
  Administered 2020-08-31 – 2020-09-01 (×2): 5 mg via ORAL
  Filled 2020-08-31 (×3): qty 1

## 2020-08-31 NOTE — ED Provider Notes (Signed)
[facility] DEPT Provider Note   CSN: [id number] Arrival date & time: 08/31/20  1225     History Chief Complaint  Patient presents with   Shortness of Breath   Dizziness   Leg Pain    Brittney Carter is a 60 y.o. female who presents to the ED today via EMS with complaint of worsening shortness of breath for the past 2 days. 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 She also complains of fevers with tmax 100.1, chills, fatigue, body aches, cough. Pt states her left leg is now cramping and her hands are swelling causing concern. Pt reports she has not felt well since being diagnosed with a PE and "just wants to get better." Pt tearful on exam and does admit that she "doesn't want to live anymore" although denies a specific plan. She does have children and granchildren that she wants to be around for; she states her children work a lot so she does not see them. One of her children went to the beach over the weekend however pt "just laid around the house" and didn't go with. She denies any recent sick contacts. She is vaccinated against COVID.   Per chart review:  Initial admission 05/31 - 06/05 for DVT and PE  Additional ED visit on 06/13 for continued leg pain and SOB with repeat CTA with resolution of blood clots. Clinically her leg was not swollen or erythematous at that time and a repeat  ultrasound was not completed  06/21 repeat ED visit for leg swelling with repeat ultrasound with findings of "age indeterminate deep venous thrombosis involving the right popliteal vein."    The history is provided by the patient, the EMS personnel and medical records.      Past Medical History:  Diagnosis Date   Arthritis    Back   Depression    no medication   Dyspnea    with exertion     Patient Active Problem List   Diagnosis Date Noted   Acute deep vein thrombosis (DVT) of distal vein of right lower  extremity (HCC)    Acute pulmonary embolism (HCC) 08/04/2020   Back pain of thoracolumbar region 10/17/2017   Long-term current use of opiate analgesic 03/28/2017   Chronic pain 11/24/2016   Cervical radiculopathy at C7 12/03/2015   Sacroiliac joint pain 12/03/2015   Chronic low back pain 11/25/2014   Segmental and somatic dysfunction of pelvic region 10/28/2014   Disc degeneration, lumbar 08/12/2014   Lumbar radiculopathy 08/12/2014   Lumbar stenosis 08/12/2014   Osteoarthritis of spine with radiculopathy, lumbar region 08/12/2014    Past Surgical History:  Procedure Laterality Date   BACK SURGERY  2016   Laminectomy   BACK SURGERY  1991   Disectomy   BREAST SURGERY     Breast lift   COLONOSCOPY W/ POLYPECTOMY     x 2   Pain stimulator trail     SPINAL CORD STIMULATOR INSERTION N/A 11/24/2016   Procedure: LUMBAR SPINAL CORD STIMULATOR INSERTION;  Surgeon: Melina Schools, MD;  Location: [facility];  Service: Orthopedics;  Laterality: N/A;  2.5 hrs   SPINAL CORD STIMULATOR REMOVAL N/A 06/28/2017   Procedure: Spinal cord stimulator battery removal;  Surgeon: Melina Schools, MD;  Location: [facility];  Service: Orthopedics;  Laterality: N/A;   TONSILLECTOMY     age 73     OB History   No obstetric history  on file.     History reviewed. No pertinent family history.  Social History   Tobacco Use   Smoking status: Never   Smokeless tobacco: Never  Vaping Use   Vaping Use: Never used  Substance Use Topics   Alcohol use: No   Drug use: No    Home Medications Prior to Admission medications   Medication Sig Start Date End Date Taking? Authorizing Provider  apixaban (ELIQUIS) 5 MG TABS tablet Take 2 tablets (10 mg total) by mouth 2 (two) times daily for 6 days, THEN 1 tablet (5 mg total) 2 (two) times daily for 24 days. 08/09/20 09/08/20  Ezekiel Slocumb, DO  Artificial Tear Solution (SYSTANE CONTACTS OP) Place 1 drop into both eyes daily as needed (dry eyes).    [provider]  diclofenac Sodium (VOLTAREN) 1 % GEL Apply 2 g topically 4 (four) times daily as needed (right knee pain).    [provider]  escitalopram (LEXAPRO) 10 MG tablet Take 15 mg by mouth daily. 06/30/20   [provider]  hydrocortisone cream 1 % Apply topically 3 (three) times daily. 08/09/20   Nicole Kindred A, DO  magnesium oxide (MAG-OX) 400 (240 Mg) MG tablet Take 1 tablet (400 mg total) by mouth 2 (two) times daily as needed (leg cramp/spasms). 08/09/20   Ezekiel Slocumb, DO  naloxone [facility]) nasal spray 4 mg/0.1 mL Place 4 mg into the nose as needed (overdose). 04/04/19   [provider]  oxycodone (OXY-IR) 5 MG capsule Take 1 capsule (5 mg total) by mouth every 12 (twelve) hours as needed. 08/27/20   Magnant, Charles L, PA-C  traZODone (DESYREL) 50 MG tablet Take 50-100 mg by mouth at bedtime. 07/03/20   [provider]    Allergies    Morphine, Tramadol, and Gabapentin  Review of Systems   Review of Systems  Constitutional:  Positive for appetite change, chills, fatigue and fever.  Respiratory:  Positive for cough and shortness of breath.   Cardiovascular:  Positive for chest pain.  Musculoskeletal:  Positive for arthralgias.  All other systems reviewed and are negative.  Physical Exam Updated Vital Signs BP (!) 142/93   Pulse 65   Temp (!) 97.3 F (36.3 C)   Resp 18   SpO2 100%   Physical Exam Vitals and nursing note reviewed.  Constitutional:      Appearance: She is not ill-appearing or diaphoretic.     Comments: Tearful on exam  HENT:     Head: Normocephalic and atraumatic.  Eyes:     Conjunctiva/sclera: Conjunctivae normal.  Cardiovascular:     Rate and Rhythm: Normal rate and regular rhythm.     Pulses: Normal pulses.  Pulmonary:     Effort: Pulmonary effort is normal.     Breath sounds: Normal breath sounds. No wheezing, rhonchi or rales.     Comments: [place] in place however oxygen not on in room. Satting 100% on RA. Able to  speak in full sentences without difficulty. LCTAB Abdominal:     Palpations: Abdomen is soft.     Tenderness: There is no abdominal tenderness. There is no guarding or rebound.  Musculoskeletal:     Cervical back: Neck supple.     Right lower leg: Tenderness present. No edema.     Left lower leg: Tenderness present. No edema.  Skin:    General: Skin is warm and dry.  Neurological:     Mental Status: She is alert.  ED Results / Procedures / Treatments   Labs (all labs ordered are listed, but only abnormal results are displayed) Labs Reviewed  COMPREHENSIVE METABOLIC PANEL - Abnormal; Notable for the following components:      Result Value   Glucose, Bld 115 (*)    All other components within normal limits  CBC WITH DIFFERENTIAL/PLATELET - Abnormal; Notable for the following components:   Hemoglobin 11.6 (*)    All other components within normal limits  ACETAMINOPHEN LEVEL - Abnormal; Notable for the following components:   Acetaminophen (Tylenol), Serum <10 (*)    All other components within normal limits  SALICYLATE LEVEL - Abnormal; Notable for the following components:   Salicylate Lvl <4.0 (*)    All other components within normal limits  RESP PANEL BY RT-PCR (FLU A&B, COVID) ARPGX2  URINALYSIS, ROUTINE W REFLEX MICROSCOPIC  ETHANOL  RAPID URINE DRUG SCREEN, HOSP PERFORMED  TROPONIN I (HIGH SENSITIVITY)    EKG EKG Interpretation  Date/Time:  Monday August 31 2020 13:26:35 EDT Ventricular Rate:  64 PR Interval:  141 QRS Duration: 88 QT Interval:  434 QTC Calculation: 448 R Axis:   45 Text Interpretation: Sinus rhythm Low voltage, precordial leads normal axis NO acute ischemia Confirmed by Lorre Munroe ([id number]) on 08/31/2020 1:29:11 PM  Radiology DG Chest Port 1 View  Result Date: 08/31/2020 CLINICAL DATA:  Shortness of breath, dizziness and bilateral lower extremity pain for the past week. EXAM: PORTABLE CHEST 1 VIEW COMPARISON:  Single-view of the chest and CT  chest 08/17/2020. FINDINGS: The lungs are clear. Heart size is normal. No pneumothorax or pleural fluid. No acute or focal bony abnormality. IMPRESSION: Negative chest. Electronically Signed   By: Inge Rise M.D.   On: 08/31/2020 14:09    Procedures Procedures   Medications Ordered in ED Medications - No data to display  ED Course  I have reviewed the triage vital signs and the nursing notes.  Pertinent labs & imaging results that were available during my care of the patient were reviewed by me and considered in my medical decision making (see chart for details).    MDM Rules/Calculators/A&P                          60 year old female who presents to the ED today with continued complaint of shortness of breath however feels like it is worse in the last 2 days.  Recent diagnosis of PE and right lower extremity DVT about 1 month ago, compliant on Eliquis.  Has had 2 subsequent ER visits with negative repeated CTA and DVT.  On arrival to the ED today patient is afebrile, nontachycardic and nontachypneic.  Satting 100% and speaking in full sentences.  She does have nasal cannula on nose however not hooked up to oxygen at this time.  Question if EMS gave her supplemental O2 for symptomatic treatment.  It is tearful on exam.  She complains of a cough and fever as well.  Denies any recent sick contacts.  She does admit that she is "tired of life" and wants to end it all.  She reports that she has not felt well since being diagnosed with her PE 1 month ago.  She denies any specific plan.  No HI or AVH.  We will plan to work-up for shortness of breath at this time however we will plan for COVID test as well and complaint of cough, fever, body aches.  I do not feel repeated CTA  or DVT would be beneficial at this time given compliance with Eliquis and patient without hypoxia or tachycardia.  Given admitted suicidal ideation I do feel consulted TTS would be appropriate.  Have added medical clearance labs  as well.   CBC without leukocytosis.  Hemoglobin stable at 11.6. CMP with glucose 115.  No other electrolyte abnormalities.  Troponin less than 2 EtOH negative.  Tylenol and aspirin levels negative. UA without infection. UDS negative CXR clear  Medically cleared at this time.  COVID test pending at this time.  It appears that it has had an error twice in the lab.  They are placing it back on at this time and will run.  At shift change case signed out to [facility] who will dispo patient accordingly after COVID and TTS eval.  TTS has psychiatrically cleared patient feel she is stable for discharge.  Will ultimately need to follow-up with her PCP given continued symptoms of shortness of breath status post PE diagnosis.  Final Clinical Impression(s) / ED Diagnoses Final diagnoses:  None    Rx / DC Orders ED Discharge Orders     None        Eustaquio Maize, PA-C 08/31/20 1523    Arnaldo Natal, MD 09/01/20 [phone]

## 2020-08-31 NOTE — ED Triage Notes (Signed)
EMS reports from home, c/o SOB, dizziness, generalized pain in legs increasing over last week. Hx of blood clots.  BP 140/70 HR 62 RR 18 Sp02 96 RA Temp 98.2

## 2020-08-31 NOTE — ED Provider Notes (Signed)
Pt signed out to me by Jamal Collin, PA-C.  Please see previous notes for further history.  In brief, patient presenting for evaluation of worsening shortness of breath for 2 days.  Associated fatigue and body aches.  Patient is very anxious because he was diagnosed with PE and DVT 1 month ago.  She is been compliant with Eliquis.  Since this diagnosis, she has been to the ER multiple times with repeat imaging which does not show acute changes.  Patient states she is tired and "wants to end it all."  Pending TTS and COVID test.  COVID negative. TTS pending.   [facility] team recommends overnight obvs abd reassessment in AM.    Franchot Heidelberg, PA-C 08/31/20 2209    Quintella Reichert, MD 08/31/20 2256

## 2020-08-31 NOTE — BH Assessment (Signed)
Comprehensive Clinical Assessment (CCA) Note  08/31/2020 Brittney Carter [id number]  DISPOSITIONVictoriano Lain NP recommends continuous assessment     Flowsheet Row ED from 08/31/2020 in [facility] DEPT ED from 08/25/2020 in [facility] DEPT ED to Hosp-Admission (Discharged) from 08/04/2020 in [facility]  C-SSRS RISK CATEGORY High Risk No Risk No Risk      The patient demonstrates the following risk factors for suicide: Chronic risk factors for suicide include: psychiatric disorder of depression . Acute risk factors for suicide include:  pain management  . Protective factors for this patient include: positive social support. Considering these factors, the overall suicide risk at this point appears to be moderate. Patient is not appropriate for outpatient follow up.   Brittney Carter is a 60 y.o. female who presents voluntary to the ED today via EMS with ongoing thoughts of self harm although is vague in reference to a plan. Patient states she "thinks about ending it all" due to ongoing pain associated with blood clots in her legs. Patient denies any H/I or AVH. Patient reports one prior attempt to self harm a year ago by overdosing and was hospitalized  at [facility]. Patient states she has been receiving ongoing OP services since then from [facility] who assists with medication management for ongoing depression and anxiety. Patient cannot recall her current medication/s although reports compliance. Patient denies any SA issues with UDS pending this date. Patient denies access to firearms or current legal issues. Patient reports current stressors to include ongoing pain management issues and lack of support from family. Patient resides alone and states this date she "doesn't want to go back to that dark hole."    PA Alroy Bailiff writes on admission: Brittney Carter is a 60 y.o. female who presents to the ED today via EMS with  complaint of worsening shortness of breath for the past 2 days. 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 She also complains of fevers with tmax 100.1, chills, fatigue, body aches, cough. Pt states her left leg is now cramping and her hands are swelling causing concern. Pt reports she has not felt well since being diagnosed with a PE and "just wants to get better." Pt tearful on exam and does admit that she "doesn't want to live anymore" although denies a specific plan. She does have children and grandchildren that she wants to be around for; she states her children work a lot so she does not see them. One of her children went to the beach over the weekend however pt "just laid around the house" and didn't go with. She denies any recent sick contacts. She is vaccinated against COVID.  Patient is oriented x 5. Patient is observed to be tearful and speaks in a low soft voice. Patient's mood is depressed with affect congruent. Patient's memory is intact with thoughts organized. Patient does not appear to be responding to internal stimuli.   Chief Complaint:  Chief Complaint  Patient presents with   Shortness of Breath   Dizziness   Leg Pain   Visit Diagnosis: MDD recurrent without psychotic features, severe    CCA Screening, Triage and Referral (STR)  Patient Reported Information How did you hear about [place]? Self  What Is the Reason for Your Visit/Call Today? Ongoing S/I  How Long Has This Been Causing You Problems? 1 wk - 1 month  What Do You Feel  Would Help You the Most Today? Treatment for Depression or other mood problem   Have You Recently Had Any Thoughts About Hurting Yourself? Yes  Are You Planning to Commit Suicide/Harm Yourself At This time? No   Have you Recently Had Thoughts About [place]? No  Are You Planning to Harm Someone at This Time? No  Explanation: No data recorded  Have You Used Any Alcohol or  Drugs in the Past 24 Hours? No  How Long Ago Did You Use Drugs or Alcohol? No data recorded What Did You Use and How Much? No data recorded  Do You Currently Have a Therapist/Psychiatrist? Yes  Name of Therapist/Psychiatrist: [place] Recently Discharged From Any Office Practice or Programs? No  Explanation of Discharge From Practice/Program: No data recorded    CCA Screening Triage Referral Assessment Type of Contact: Face-to-Face  Telemedicine Service Delivery:   Is this Initial or Reassessment? No data recorded Date Telepsych consult ordered in [facility]:  No data recorded Time Telepsych consult ordered in [facility]:  No data recorded Location of Assessment: [facility] ED  Provider Location: Other (comment) ([facility])   Collateral Involvement: NA   Does Patient Have a [place]? No data recorded Name and Contact of Legal Guardian: No data recorded If Minor and Not Living with Parent(s), Who has Custody? NA  Is CPS involved or ever been involved? Never  Is APS involved or ever been involved? Never   Patient Determined To Be At Risk for Harm To Self or Others Based on Review of Patient Reported Information or Presenting Complaint? Yes, for Self-Harm  Method: No data recorded Availability of Means: No data recorded Intent: No data recorded Notification Required: No data recorded Additional Information for Danger to Others Potential: No data recorded Additional Comments for Danger to Others Potential: No data recorded Are There Guns or Other Weapons in Your Home? No data recorded Types of Guns/Weapons: No data recorded Are These Weapons Safely Secured?                            No data recorded Who Could Verify You Are Able To Have These Secured: No data recorded Do You Have any Outstanding Charges, Pending Court Dates, Parole/Probation? No data recorded Contacted To Inform of Risk of Harm To Self or Others: Other: Comment (NA)    Does Patient  Present under Involuntary Commitment? No data recorded IVC Papers Initial File Date: No data recorded  [place] of Residence: [place]   Patient Currently Receiving the Following Services: No data recorded  Determination of Need: Urgent (48 hours)   Options For Referral: Outpatient Therapy     CCA Biopsychosocial Patient Reported Schizophrenia/Schizoaffective Diagnosis in Past: No   Strengths: Pt is willing to participate in treatment   Mental Health Symptoms Depression:   Change in energy/activity   Duration of Depressive symptoms:  Duration of Depressive Symptoms: Greater than two weeks   Mania:   None   Anxiety:    Difficulty concentrating   Psychosis:   None   Duration of Psychotic symptoms:    Trauma:   None   Obsessions:   None   Compulsions:   None   Inattention:   None   Hyperactivity/Impulsivity:   None   Oppositional/Defiant Behaviors:   None   Emotional Irregularity:   Chronic feelings of emptiness   Other Mood/Personality Symptoms:   None noted at this time  Mental Status Exam Appearance and self-care  Stature:   Average   Weight:   Average weight   Clothing:   Neat/clean   Grooming:   Normal   Cosmetic use:   None   Posture/gait:   Normal   Motor activity:   Not Remarkable   Sensorium  Attention:   Normal   Concentration:   Normal   Orientation:   X5   Recall/memory:   Normal   Affect and Mood  Affect:   Anxious   Mood:   Depressed; Anxious   Relating  Eye contact:   Fleeting   Facial expression:   Anxious   Attitude toward examiner:   Cooperative   Thought and Language  Speech flow:  Slow   Thought content:   Appropriate to Mood and Circumstances   Preoccupation:   None   Hallucinations:   None   Organization:  No data recorded  [employer] of Knowledge:   Fair   Intelligence:   Average   Abstraction:   Normal   Judgement:   Fair   Associate Professor   Insight:   Fair   Decision Making:   Normal   Social Functioning  Social Maturity:   Responsible   Social Judgement:   Normal   Stress  Stressors:   Family conflict   Coping Ability:   Programme researcher, broadcasting/film/video Deficits:   Activities of daily living   Supports:   Usual     Religion: Religion/Spirituality Are You A Religious Person?: Yes (Pt reports spiritual)  Leisure/Recreation: Leisure / Recreation Do You Have Hobbies?: No  Exercise/Diet: Exercise/Diet Do You Exercise?: No Have You Gained or Lost A Significant Amount of Weight in the Past Six Months?: No Do You Follow a Special Diet?: No Do You Have Any Trouble Sleeping?: Yes Explanation of Sleeping Difficulties: Pt states they cannot sleep because of pain   CCA Employment/Education Employment/Work Situation: Employment / Work Situation Employment Situation: Unemployed  Education: Education Is Patient Currently Attending School?: No Did Physicist, medical?: No Did You Have An Individualized Education Program (IIEP): No Did You Have Any Difficulty At [employer]?: No Patient's Education Has Been Impacted by Current Illness: No   CCA Family/Childhood History Family and Relationship History: Family history Marital status: Single Does patient have children?: Yes How many children?: 2 How is patient's relationship with their children?: Good  Childhood History:  Childhood History Did patient suffer any verbal/emotional/physical/sexual abuse as a child?: No Did patient suffer from severe childhood neglect?: No Has patient ever been sexually abused/assaulted/raped as an adolescent or adult?: No Was the patient ever a victim of a crime or a disaster?: No Witnessed domestic violence?: No Has patient been affected by domestic violence as an adult?: No  Child/Adolescent Assessment:     CCA Substance Use Alcohol/Drug Use:                           ASAM's:  Six  Dimensions of Multidimensional Assessment  Dimension 1:  Acute Intoxication and/or Withdrawal Potential:      Dimension 2:  Biomedical Conditions and Complications:      Dimension 3:  Emotional, Behavioral, or Cognitive Conditions and Complications:     Dimension 4:  Readiness to Change:     Dimension 5:  Relapse, Continued use, or Continued Problem Potential:     Dimension 6:  Recovery/Living Environment:     ASAM Severity Score:  ASAM Recommended Level of Treatment:     Substance use Disorder (SUD)    Recommendations for Services/Supports/Treatments:    Discharge Disposition:    DSM5 Diagnoses: Patient Active Problem List   Diagnosis Date Noted   Acute deep vein thrombosis (DVT) of distal vein of right lower extremity (HCC)    Acute pulmonary embolism ([place]) 08/04/2020   Back pain of thoracolumbar region 10/17/2017   Long-term current use of opiate analgesic 03/28/2017   Chronic pain 11/24/2016   Cervical radiculopathy at C7 12/03/2015   Sacroiliac joint pain 12/03/2015   Chronic low back pain 11/25/2014   Segmental and somatic dysfunction of pelvic region 10/28/2014   Disc degeneration, lumbar 08/12/2014   Lumbar radiculopathy 08/12/2014   Lumbar stenosis 08/12/2014   Osteoarthritis of spine with radiculopathy, lumbar region 08/12/2014     Referrals to Alternative Service(s): Referred to Alternative Service(s):   Place:   Date:   Time:    Referred to Alternative Service(s):   Place:   Date:   Time:    Referred to Alternative Service(s):   Place:   Date:   Time:    Referred to Alternative Service(s):   Place:   Date:   Time:     Mamie Nick, LCAS

## 2020-08-31 NOTE — Consult Note (Signed)
Patient will remain in ED overnight and be reassessed by psychiatry in the morning. Recent diagnosis of right lower extremity DVT.    Restart Apixaban 5 mg PO BID (first dose tonight)  Lexapro 15 mg PO Daily (first does tonight)   Staff to continue to monitor for safety and stabilization.

## 2020-09-01 ENCOUNTER — Emergency Department (HOSPITAL_BASED_OUTPATIENT_CLINIC_OR_DEPARTMENT_OTHER)
Admission: EM | Admit: 2020-09-01 | Discharge: 2020-09-01 | Disposition: A | Discharge status: Home / Self Care | Payer: Medicare Other | Source: Home / Self Care | Attending: Emergency Medicine | Admitting: Emergency Medicine

## 2020-09-01 DIAGNOSIS — R45851 Suicidal ideations: Secondary | ICD-10-CM

## 2020-09-01 DIAGNOSIS — M79605 Pain in left leg: Secondary | ICD-10-CM | POA: Diagnosis not present

## 2020-09-01 MED ORDER — CYCLOBENZAPRINE HCL 10 MG PO TABS: 10.0000 mg | ORAL_TABLET | Freq: Two times a day (BID) | ORAL | 0 refills | Status: DC | PRN

## 2020-09-01 MED ORDER — OXYCODONE HCL 5 MG PO TABS
5.0000 mg | ORAL_TABLET | Freq: Once | ORAL | Status: AC
  Administered 2020-09-01: 5 mg via ORAL
  Filled 2020-09-01: qty 1

## 2020-09-01 NOTE — Progress Notes (Signed)
Left lower extremity venous duplex has been completed. Preliminary results can be found in CV Proc through chart review.  Results were given to Dr. Roslynn Amble.  09/01/20 2:17 PM Carlos Levering RVT

## 2020-09-01 NOTE — ED Notes (Signed)
Pt in bed eating lunch. Respirations even and unlabored.

## 2020-09-01 NOTE — BH Assessment (Signed)
[facility] Assessment Progress Note   Per Pecolia Ades, NP, this voluntary pt does not require psychiatric hospitalization at this time.  Pt is psychiatrically cleared.  Pt would benefit from outpatient referrals at this time.  I spoke to pt about her needs.  She reports that she currently receives psychiatry through [facility] in [place], but that they do not offer therapy, which she believes she needs at this time.  I discussed PHP and MH-IOP through [facility] with her, but she was not interested in the group therapy aspects of these services.  She would also like to have a provider as close to home as possible.  Discharge instructions include referrals for [facility] and for [facility] in [place].  EDP Gareth Morgan, MD and pt's nurse, Dorian Pod, have been notified.  Jalene Mullet, [place] Triage Specialist [phone]

## 2020-09-01 NOTE — ED Provider Notes (Signed)
  Physical Exam  BP (!) 137/95 (BP Location: Left Arm)   Pulse 84   Temp 98.7 F (37.1 C) (Oral)   Resp 16   SpO2 98%   Physical Exam  ED Course/Procedures     Procedures  MDM    Received care of patient from previous providers.  Please see their notes for prior history, physical and care.  Briefly this is a 60 year old female who presented with concern for leg pain, fatigue, shortness of breath and body aches.  She had made comments last night that she "wanted to end it all" and has been awaiting psychiatric evaluation.  Psychiatry reevaluated her this morning and feels she is psychiatrically cleared.  On my evaluation, patient reports that she is having new left lower extremity pain.  She has good pulses bilaterally, no sign of acute arterial thrombus.  She is concerned that she might be having a DVT now on the left despite her reported compliance with Eliquis.  Given this is her chief concern, will order a DVT study for further evaluation.  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  Given new left LE pain, ordered DVT study which shows no evidence of left sided DVT.  Do feel outpatient vascular referral is appropriate given concern for continued pain related to her right sided DVT. Given rx for flexeril. Patient discharged in stable condition with understanding of reasons to return.      Gareth Morgan, MD 09/01/20 2150

## 2020-09-01 NOTE — Consult Note (Signed)
[facility] Face-to-Face Psychiatry Consult   Reason for Consult: Psychiatric evaluation Referring Physician:  Dr. Billy Fischer, [place] Patient Identification: Brittney Carter MRN:  [id number] Principal Diagnosis: Suicidal thoughts Diagnosis:  Principal Problem:   Suicidal thoughts Active Problems:   Chronic pain   Total Time spent with patient: 30 minutes  Subjective:   Brittney Carter is a 60 y.o. female patient admitted with  per admit notee:   Brittney Carter is a 60 y.o. female who presents to the ED today via EMS with complaint of worsening shortness of breath for the past 2 days. 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 She also complains of fevers with tmax 100.1, chills, fatigue, body aches, cough. Pt states her left leg is now cramping and her hands are swelling causing concern. Pt reports she has not felt well since being diagnosed with a PE and "just wants to get better." Pt tearful on exam and does admit that she "doesn't want to live anymore" although denies a specific plan. She does have children and granchildren that she wants to be around for; she states her children work a lot so she does not see them.  HPI: Seen by this provider face-to-face at [facility] emergency department.  Patient reports "I came here to get help for my blood clots ""I am tired of the pain from my legs"  patient is alert and oriented to person place time situation.  Has good eye contact throughout interview, calmly sitting in the emergency room chair.  She is cooperative during interview, and describes her mood as "happy" "speech normal, volume normal.  Does not appear to be responding to internal or external stimuli.  Denies suicidal ideation without intent without and plan, denies homicidal ideation, denies auditory and visual hallucinations.  She is able to contract for safety.  Denies access to weapons.  Reports a past suicide attempt, 1 year ago, took some  pills was in [facility] for an inpatient stay after that.  Denies substance and alcohol use.  Endorses getting 5 hours of sleep per night, has a normal appetite, sees [facility] for medication management virtually, wishes to have a psychiatrist.  She reports living alone in a trailer, on disability, describes her support system as her daughter, Brittney Carter.  Dr. Nyra Capes, PCP,  in [place], has appointment on Thursday at 1:15.  Protective factors include her grandchildren which are 91 and 28 years old.  She has been diagnosed with a DVT recently, she has on Eliquis, reports that she has not missed any doses.  Her PCP will manage her Eliquis, reports that she has adequate amount of Eliquis until she sees her PCP this week.  Reports compliance with current medications which include Lexapro, Eliquis and trazodone for sleep.  Denies any legal issues or pending court dates.  Past Psychiatric History:   Risk to Self:  no Risk to Others:  no Prior Inpatient Therapy:  yes Prior Outpatient Therapy:  yes  Past Medical History:  Past Medical History:  Diagnosis Date   Arthritis    Back   Depression    no medication   Dyspnea    with exertion     Past Surgical History:  Procedure Laterality Date   BACK SURGERY  2016   Laminectomy   BACK SURGERY  1991   Disectomy   BREAST SURGERY     Breast lift   COLONOSCOPY W/ POLYPECTOMY     x  2   Pain stimulator trail     SPINAL CORD STIMULATOR INSERTION N/A 11/24/2016   Procedure: LUMBAR SPINAL CORD STIMULATOR INSERTION;  Surgeon: Melina Schools, MD;  Location: [facility];  Service: Orthopedics;  Laterality: N/A;  2.5 hrs   SPINAL CORD STIMULATOR REMOVAL N/A 06/28/2017   Procedure: Spinal cord stimulator battery removal;  Surgeon: Melina Schools, MD;  Location: [facility];  Service: Orthopedics;  Laterality: N/A;   TONSILLECTOMY     age 19   Family History: History reviewed. No pertinent family history. Family Psychiatric  History:  Social History:  Social History   Substance  and Sexual Activity  Alcohol Use No     Social History   Substance and Sexual Activity  Drug Use No    Social History   Socioeconomic History   Marital status: Divorced    Spouse name: Not on file   Number of children: Not on file   Years of education: Not on file   Highest education level: Not on file  Occupational History   Not on file  Tobacco Use   Smoking status: Never   Smokeless tobacco: Never  Vaping Use   Vaping Use: Never used  Substance and Sexual Activity   Alcohol use: No   Drug use: No   Sexual activity: Not on file  Other Topics Concern   Not on file  Social History Narrative   Not on file   Social Determinants of Health   Financial Resource Strain: Not on file  Food Insecurity: No Food Insecurity   Worried About Running Out of Food in the Last Year: Never true   [place] in the Last Year: Never true  Transportation Needs: No Transportation Needs   Lack of Transportation (Medical): No   Lack of Transportation (Non-Medical): No  Physical Activity: Not on file  Stress: Not on file  Social Connections: Not on file   Additional Social History:    Allergies:   Allergies  Allergen Reactions   Morphine Anaphylaxis, Hives, Shortness Of Breath and Rash   Tramadol Anaphylaxis, Swelling and Rash    headache   Gabapentin Rash and Swelling    Labs:  Results for orders placed or performed during the hospital encounter of 08/31/20 (from the past 48 hour(s))  Comprehensive metabolic panel     Status: Abnormal   Collection Time: 08/31/20 12:54 PM  Result Value Ref Range   Sodium 140 135 - 145 mmol/L   Potassium 3.8 3.5 - 5.1 mmol/L   Chloride 107 98 - 111 mmol/L   CO2 25 22 - 32 mmol/L   Glucose, Bld 115 (H) 70 - 99 mg/dL    Comment: Glucose reference range applies only to samples taken after fasting for at least 8 hours.   BUN 12 6 - 20 mg/dL   Creatinine, Ser 0.74 0.44 - 1.00 mg/dL   Calcium 9.6 8.9 - 10.3 mg/dL   Total Protein 7.4 6.5 -  8.1 g/dL   Albumin 4.0 3.5 - 5.0 g/dL   AST 22 15 - 41 U/L   ALT 17 0 - 44 U/L   Alkaline Phosphatase 51 38 - 126 U/L   Total Bilirubin 0.7 0.3 - 1.2 mg/dL   GFR, Estimated >60 >60 mL/min    Comment: (NOTE) Calculated using the CKD-EPI Creatinine Equation (2021)    Anion gap 8 5 - 15    Comment: Performed at [facility], [place] [address], [place], [place] [address]  CBC with  Differential     Status: Abnormal   Collection Time: 08/31/20 12:54 PM  Result Value Ref Range   WBC 6.4 4.0 - 10.5 K/uL   RBC 4.42 3.87 - 5.11 MIL/uL   Hemoglobin 11.6 (L) 12.0 - 15.0 g/dL   HCT 36.6 36.0 - 46.0 %   MCV 82.8 80.0 - 100.0 fL   MCH 26.2 26.0 - 34.0 pg   MCHC 31.7 30.0 - 36.0 g/dL   RDW 15.2 11.5 - 15.5 %   Platelets 314 150 - 400 K/uL   nRBC 0.0 0.0 - 0.2 %   Neutrophils Relative % 70 %   Neutro Abs 4.4 1.7 - 7.7 K/uL   Lymphocytes Relative 22 %   Lymphs Abs 1.4 0.7 - 4.0 K/uL   Monocytes Relative 7 %   Monocytes Absolute 0.4 0.1 - 1.0 K/uL   Eosinophils Relative 0 %   Eosinophils Absolute 0.0 0.0 - 0.5 K/uL   Basophils Relative 1 %   Basophils Absolute 0.1 0.0 - 0.1 K/uL   Immature Granulocytes 0 %   Abs Immature Granulocytes 0.02 0.00 - 0.07 K/uL    Comment: Performed at [facility], [place] [address], [place], [place] [address]  Troponin I (High Sensitivity)     Status: None   Collection Time: 08/31/20 12:54 PM  Result Value Ref Range   Troponin I (High Sensitivity) <2 <18 ng/L    Comment: (NOTE) Elevated high sensitivity troponin I (hsTnI) values and significant  changes across serial measurements may suggest ACS but many other  chronic and acute conditions are known to elevate hsTnI results.  Refer to the "Links" section for chest pain algorithms and additional  guidance. Performed at [facility], [place] [address], [place], [place] [address]   Resp Panel by RT-PCR (Flu A&B, Covid) Nasopharyngeal Swab     Status: None    Collection Time: 08/31/20 12:55 PM   Specimen: Nasopharyngeal Swab; Nasopharyngeal(NP) swabs in vial transport medium  Result Value Ref Range   SARS Coronavirus 2 by RT PCR NEGATIVE NEGATIVE    Comment: (NOTE) SARS-CoV-2 target nucleic acids are NOT DETECTED.  The SARS-CoV-2 RNA is generally detectable in upper respiratory specimens during the acute phase of infection. The lowest concentration of SARS-CoV-2 viral copies this assay can detect is 138 copies/mL. A negative result does not preclude SARS-Cov-2 infection and should not be used as the sole basis for treatment or other patient management decisions. A negative result may occur with  improper specimen collection/handling, submission of specimen other than nasopharyngeal swab, presence of viral mutation(s) within the areas targeted by this assay, and inadequate number of viral copies(<138 copies/mL). A negative result must be combined with clinical observations, patient history, and epidemiological information. The expected result is Negative.  Fact Sheet for Patients:  EntrepreneurPulse.com.au  Fact Sheet for Healthcare Providers:  IncredibleEmployment.be  This test is no t yet approved or cleared by the [place] FDA and  has been authorized for detection and/or diagnosis of SARS-CoV-2 by FDA under an Emergency Use Authorization (EUA). This EUA will remain  in effect (meaning this test can be used) for the duration of the COVID-19 declaration under Section 564(b)(1) of the Act, 21 U.S.C.section 360bbb-3(b)(1), unless the authorization is terminated  or revoked sooner.       Influenza A by PCR NEGATIVE NEGATIVE   Influenza B by PCR NEGATIVE NEGATIVE    Comment: (NOTE) The Xpert Xpress SARS-CoV-2/FLU/RSV plus assay is intended as an aid in  the diagnosis of influenza from Nasopharyngeal swab specimens and should not be used as a sole basis for treatment. Nasal washings  and aspirates are unacceptable for Xpert Xpress SARS-CoV-2/FLU/RSV testing.  Fact Sheet for Patients: EntrepreneurPulse.com.au  Fact Sheet for Healthcare Providers: IncredibleEmployment.be  This test is not yet approved or cleared by the [place] FDA and has been authorized for detection and/or diagnosis of SARS-CoV-2 by FDA under an Emergency Use Authorization (EUA). This EUA will remain in effect (meaning this test can be used) for the duration of the COVID-19 declaration under Section 564(b)(1) of the Act, 21 U.S.C. section 360bbb-3(b)(1), unless the authorization is terminated or revoked.  Performed at [facility], [place] [address] [place], [place] [address]   Ethanol     Status: None   Collection Time: 08/31/20 12:56 PM  Result Value Ref Range   Alcohol, Ethyl (B) <10 <10 mg/dL    Comment: (NOTE) Lowest detectable limit for serum alcohol is 10 mg/dL.  For medical purposes only. Performed at [facility], [place] [address], [place], [place] [address]   Acetaminophen level     Status: Abnormal   Collection Time: 08/31/20 12:56 PM  Result Value Ref Range   Acetaminophen (Tylenol), Serum <10 (L) 10 - 30 ug/mL    Comment: (NOTE) Therapeutic concentrations vary significantly. A range of 10-30 ug/mL  may be an effective concentration for many patients. However, some  are best treated at concentrations outside of this range. Acetaminophen concentrations >150 ug/mL at 4 hours after ingestion  and >50 ug/mL at 12 hours after ingestion are often associated with  toxic reactions.  Performed at [facility], [place] [address] [place], [place] [address]   Salicylate level     Status: Abnormal   Collection Time: 08/31/20 12:56 PM  Result Value Ref Range   Salicylate Lvl <0.3 (L) 7.0 - 30.0 mg/dL    Comment: Performed at [facility], [place] [address] [place],  [place] [address]  Urinalysis, Routine w reflex microscopic Urine, Clean Catch     Status: None   Collection Time: 08/31/20  2:20 PM  Result Value Ref Range   Color, Urine YELLOW YELLOW   APPearance CLEAR CLEAR   Specific Gravity, Urine 1.016 1.005 - 1.030   pH 8.0 5.0 - 8.0   Glucose, UA NEGATIVE NEGATIVE mg/dL   Hgb urine dipstick NEGATIVE NEGATIVE   Bilirubin Urine NEGATIVE NEGATIVE   Ketones, ur NEGATIVE NEGATIVE mg/dL   Protein, ur NEGATIVE NEGATIVE mg/dL   Nitrite NEGATIVE NEGATIVE   Leukocytes,Ua NEGATIVE NEGATIVE    Comment: Performed at [facility], [place] [address] [place],  [address]  Urine rapid drug screen (hosp performed)     Status: None   Collection Time: 08/31/20  2:20 PM  Result Value Ref Range   Opiates NONE DETECTED NONE DETECTED   Cocaine NONE DETECTED NONE DETECTED   Benzodiazepines NONE DETECTED NONE DETECTED   Amphetamines NONE DETECTED NONE DETECTED   Tetrahydrocannabinol NONE DETECTED NONE DETECTED   Barbiturates NONE DETECTED NONE DETECTED    Comment: (NOTE) DRUG SCREEN FOR MEDICAL PURPOSES ONLY.  IF CONFIRMATION IS NEEDED FOR ANY PURPOSE, NOTIFY LAB WITHIN 5 DAYS.  LOWEST DETECTABLE LIMITS FOR URINE DRUG SCREEN Drug Class                     Cutoff (ng/mL) Amphetamine and metabolites    1000 Barbiturate and metabolites    200 Benzodiazepine  546 Tricyclics and metabolites     300 Opiates and metabolites        300 Cocaine and metabolites        300 THC                            50 Performed at [facility], [place] [address] [place], [place] [address]     Current Facility-Administered Medications  Medication Dose Route Frequency Provider Last Rate Last Admin   apixaban (ELIQUIS) tablet 5 mg  5 mg Oral BID Chalmers Guest, NP   5 mg at 09/01/20 1007   diclofenac Sodium (VOLTAREN) 1 % topical gel 2 g  2 g Topical QID Caccavale, Sophia, PA-C   2 g at 09/01/20 0957   escitalopram (LEXAPRO)  tablet 15 mg  15 mg Oral Daily Chalmers Guest, NP   15 mg at 09/01/20 1007   traZODone (DESYREL) tablet 50 mg  50 mg Oral QHS Caccavale, Sophia, PA-C   50 mg at 08/31/20 2350   Current Outpatient Medications  Medication Sig Dispense Refill   cyclobenzaprine (FLEXERIL) 10 MG tablet Take 1 tablet (10 mg total) by mouth 2 (two) times daily as needed for muscle spasms. 20 tablet 0   apixaban (ELIQUIS) 5 MG TABS tablet Take 2 tablets (10 mg total) by mouth 2 (two) times daily for 6 days, THEN 1 tablet (5 mg total) 2 (two) times daily for 24 days. 72 tablet 0   Artificial Tear Solution (SYSTANE CONTACTS OP) Place 1 drop into both eyes daily as needed (dry eyes).     diclofenac Sodium (VOLTAREN) 1 % GEL Apply 2 g topically 4 (four) times daily as needed (right knee pain).     escitalopram (LEXAPRO) 10 MG tablet Take 15 mg by mouth daily.     hydrocortisone cream 1 % Apply topically 3 (three) times daily. 30 g 0   magnesium oxide (MAG-OX) 400 (240 Mg) MG tablet Take 1 tablet (400 mg total) by mouth 2 (two) times daily as needed (leg cramp/spasms).     naloxone (NARCAN) nasal spray 4 mg/0.1 mL Place 4 mg into the nose as needed (overdose).     oxycodone (OXY-IR) 5 MG capsule Take 1 capsule (5 mg total) by mouth every 12 (twelve) hours as needed. 12 capsule 0   traZODone (DESYREL) 50 MG tablet Take 50-100 mg by mouth at bedtime.      Musculoskeletal: Strength & Muscle Tone: within normal limits Gait & Station: normal Patient leans: N/A     Psychiatric Specialty Exam:  Presentation  General Appearance:  Appropriate for Environment Eye Contact: Good Speech: Clear and Coherent Speech Volume: Normal Handedness: Right  Mood and Affect  Mood: Euthymic Affect: Congruent  Thought Process  Thought Processes: Coherent; Goal Directed Descriptions of Associations:Intact Orientation:Full (Time, Place and Person) Thought Content:Abstract Reasoning; Logical History of  Schizophrenia/Schizoaffective disorder:No  Duration of Psychotic Symptoms:No data recorded Hallucinations:Hallucinations: None Ideas of Reference:None Suicidal Thoughts:Suicidal Thoughts: No Homicidal Thoughts:Homicidal Thoughts: No  Sensorium  Memory: Immediate Good; Recent Good; Remote Good Judgment: Good Insight: Good  Executive Functions  Concentration: Good Attention Span: Good Recall: Good Fund of Knowledge: Good Language: Good  Psychomotor Activity  Psychomotor Activity: No data recorded  Assets  Assets: Communication Skills; Desire for Improvement; Financial Resources/Insurance; Housing; Physical Health; Social Support  Sleep  Sleep: Sleep: Fair Number of Hours of Sleep: 5  Physical Exam: Physical Exam Cardiovascular:  Rate and Rhythm: Normal rate.  Pulmonary:     Effort: Pulmonary effort is normal.  Musculoskeletal:     Comments: Right lower extremity DVT (diagnosed in May 2022).   Skin:    General: Skin is warm and dry.  Neurological:     Mental Status: She is alert and oriented to person, place, and time.  Psychiatric:        Attention and Perception: Attention normal.        Mood and Affect: Mood normal.        Speech: Speech normal.        Behavior: Behavior normal. Behavior is cooperative.        Thought Content: Thought content normal. Thought content is not paranoid or delusional. Thought content does not include homicidal or suicidal ideation. Thought content does not include homicidal or suicidal plan.        Cognition and Memory: Cognition normal.        Judgment: Judgment normal.   Review of Systems  Constitutional:  Negative for chills and fever.  Respiratory:  Negative for shortness of breath.   Cardiovascular:  Negative for chest pain and palpitations.  Gastrointestinal:  Negative for abdominal pain.  Neurological:  Negative for headaches.  Psychiatric/Behavioral:  Negative for depression, hallucinations, substance abuse  and suicidal ideas. The patient is not nervous/anxious and does not have insomnia.   Blood pressure (!) 151/88, pulse 78, temperature 98.7 F (37.1 C), resp. rate 18, SpO2 98 %. There is no height or weight on file to calculate BMI.  Treatment Plan Summary: Plan safe for outpatient treatment with resources provided.  Patient is psychiatrically cleared.  Options discussed with patient, not interested in enhanced services, information on therapist in the community provided per counselor at discharge.   Disposition: No evidence of imminent risk to self or others at present.   Patient does not meet criteria for psychiatric inpatient admission. Supportive therapy provided about ongoing stressors. Discussed crisis plan, support from social network, calling 911, coming to the Emergency Department, and calling Suicide Hotline. Agreeable to plan. Discharge information provided on AVS. She will follow-up with PCP for ongoing care for right lower extremity DVT.   Chalmers Guest, NP 09/01/2020 2:50 PM

## 2020-09-01 NOTE — Discharge Instructions (Addendum)
For your behavioral health needs you are advised to continue with your current psychiatry provider:       [facility]      [address] [place], [place] [address]      [phone]  You may also benefit from seeing a therapist.  Contact one of the following providers at your earliest opportunity to schedule an intake appointment:       [facility]      [place], [place] [address]      [phone]       [facility]      [place], [place] [address]      [phone]      New patients are seen at their walk-in clinic.  Walk-in hours are Monday - Friday from 8:30 am - 12:00 pm, and from 1:00 pm - 2:30 pm.  Walk-in patients are seen on a first come, first served basis, so try to arrive as early as possible for the best chance of being seen the same day.

## 2020-09-02 ENCOUNTER — Telehealth: Payer: Self-pay | Admitting: Surgical

## 2020-09-02 DIAGNOSIS — M25561 Pain in right knee: Secondary | ICD-10-CM | POA: Diagnosis not present

## 2020-09-02 DIAGNOSIS — M25461 Effusion, right knee: Secondary | ICD-10-CM | POA: Diagnosis not present

## 2020-09-02 DIAGNOSIS — Z86711 Personal history of pulmonary embolism: Secondary | ICD-10-CM | POA: Diagnosis not present

## 2020-09-02 NOTE — Telephone Encounter (Signed)
Pt called asking for a refill of her pain rx, she states she has an appt on 09/09/20 but she would like to have 5 days worth called in. Pt would like a CB when something has been called in.   [phone]

## 2020-09-03 ENCOUNTER — Other Ambulatory Visit: Payer: Self-pay | Admitting: Surgical

## 2020-09-03 MED ORDER — OXYCODONE HCL 5 MG PO CAPS: 5.0000 mg | ORAL_CAPSULE | Freq: Two times a day (BID) | ORAL | 0 refills | Status: DC | PRN

## 2020-09-03 NOTE — Telephone Encounter (Signed)
Okay for this, will send in pain medicine but this will be the start of weaning off over the next 2 weeks or so as the last ultrasound showed she no longer has blood clot present and pain should be improving.  Do not want to keep her on opioid medication longer than is necessary

## 2020-09-03 NOTE — Telephone Encounter (Signed)
Pt called checking on the status of her pain medicine refill. Pt again stated she does have an appt on 09/09/20 but is it a lot of pain and needing the pain medication to help until that date. The best pharmacy is the one on file and the best call back number if needed is [phone].

## 2020-09-04 ENCOUNTER — Telehealth: Payer: Self-pay | Admitting: Surgical

## 2020-09-04 DIAGNOSIS — F32A Depression, unspecified: Secondary | ICD-10-CM | POA: Diagnosis not present

## 2020-09-04 DIAGNOSIS — Z9181 History of falling: Secondary | ICD-10-CM | POA: Diagnosis not present

## 2020-09-04 DIAGNOSIS — Z7901 Long term (current) use of anticoagulants: Secondary | ICD-10-CM | POA: Diagnosis not present

## 2020-09-04 DIAGNOSIS — M1711 Unilateral primary osteoarthritis, right knee: Secondary | ICD-10-CM | POA: Diagnosis not present

## 2020-09-04 NOTE — Telephone Encounter (Signed)
Tried calling patient to discuss. No answer.

## 2020-09-04 NOTE — Telephone Encounter (Signed)
Patient returned a call from [facility]. Her call back number is [phone]

## 2020-09-05 DIAGNOSIS — M25461 Effusion, right knee: Secondary | ICD-10-CM | POA: Diagnosis not present

## 2020-09-05 DIAGNOSIS — M1711 Unilateral primary osteoarthritis, right knee: Secondary | ICD-10-CM | POA: Diagnosis not present

## 2020-09-08 DIAGNOSIS — M25561 Pain in right knee: Secondary | ICD-10-CM | POA: Diagnosis not present

## 2020-09-08 DIAGNOSIS — M25461 Effusion, right knee: Secondary | ICD-10-CM | POA: Diagnosis not present

## 2020-09-08 DIAGNOSIS — S86012A Strain of left Achilles tendon, initial encounter: Secondary | ICD-10-CM | POA: Diagnosis not present

## 2020-09-08 DIAGNOSIS — Z86711 Personal history of pulmonary embolism: Secondary | ICD-10-CM | POA: Diagnosis not present

## 2020-09-08 DIAGNOSIS — M25572 Pain in left ankle and joints of left foot: Secondary | ICD-10-CM | POA: Diagnosis not present

## 2020-09-08 DIAGNOSIS — S99912A Unspecified injury of left ankle, initial encounter: Secondary | ICD-10-CM | POA: Diagnosis not present

## 2020-09-08 DIAGNOSIS — Z86718 Personal history of other venous thrombosis and embolism: Secondary | ICD-10-CM | POA: Diagnosis not present

## 2020-09-08 DIAGNOSIS — S93402A Sprain of unspecified ligament of left ankle, initial encounter: Secondary | ICD-10-CM | POA: Diagnosis not present

## 2020-09-09 DIAGNOSIS — Z79891 Long term (current) use of opiate analgesic: Secondary | ICD-10-CM | POA: Diagnosis not present

## 2020-09-09 DIAGNOSIS — M961 Postlaminectomy syndrome, not elsewhere classified: Secondary | ICD-10-CM | POA: Diagnosis not present

## 2020-09-09 DIAGNOSIS — M25561 Pain in right knee: Secondary | ICD-10-CM | POA: Diagnosis not present

## 2020-09-09 DIAGNOSIS — G894 Chronic pain syndrome: Secondary | ICD-10-CM | POA: Diagnosis not present

## 2020-09-09 DIAGNOSIS — G8929 Other chronic pain: Secondary | ICD-10-CM | POA: Diagnosis not present

## 2020-09-09 DIAGNOSIS — M545 Low back pain, unspecified: Secondary | ICD-10-CM | POA: Diagnosis not present

## 2020-09-09 NOTE — Telephone Encounter (Signed)
Tried calling again.

## 2020-09-10 ENCOUNTER — Ambulatory Visit: Payer: Medicare Other | Admitting: Physician Assistant

## 2020-09-17 ENCOUNTER — Other Ambulatory Visit: Payer: Self-pay | Admitting: *Deleted

## 2020-09-18 ENCOUNTER — Other Ambulatory Visit: Payer: Self-pay | Admitting: *Deleted

## 2020-09-18 ENCOUNTER — Ambulatory Visit: Payer: Medicare Other | Admitting: Gastroenterology

## 2020-09-18 NOTE — Patient Outreach (Signed)
[facility]) Care Management  09/17/20  Brittney Carter 1960/11/15 [id number]  Telephone outreach on behalf of primary care manager, [name], RN. Call was unsuccessful, left message and will call again tomorrow.  [name]. [name], MSN, [facility] Gerontological Nurse Practitioner [facility] Care Management [phone]

## 2020-09-18 NOTE — Patient Outreach (Signed)
[facility]) Care Management  [place]  09/18/2020   KARRIN EISENMENGER 08/08/1960 [id number]  Subjective: Brief conversation on behalf of pt's primary care manager, [name], RN. Pt is getting ready to go to an MD appt with Dr. [name] for rectal bleeding. She is still taking apixiban for DVT/PE.   Encounter Medications:  Outpatient Encounter Medications as of 09/18/2020  Medication Sig   apixaban (ELIQUIS) 5 MG TABS tablet Take 2 tablets (10 mg total) by mouth 2 (two) times daily for 6 days, THEN 1 tablet (5 mg total) 2 (two) times daily for 24 days.   Artificial Tear Solution (SYSTANE CONTACTS OP) Place 1 drop into both eyes daily as needed (dry eyes).   cyclobenzaprine (FLEXERIL) 10 MG tablet Take 1 tablet (10 mg total) by mouth 2 (two) times daily as needed for muscle spasms.   diclofenac Sodium (VOLTAREN) 1 % GEL Apply 2 g topically 4 (four) times daily as needed (right knee pain).   escitalopram (LEXAPRO) 10 MG tablet Take 15 mg by mouth daily.   hydrocortisone cream 1 % Apply topically 3 (three) times daily.   magnesium oxide (MAG-OX) 400 (240 Mg) MG tablet Take 1 tablet (400 mg total) by mouth 2 (two) times daily as needed (leg cramp/spasms).   naloxone (NARCAN) nasal spray 4 mg/0.1 mL Place 4 mg into the nose as needed (overdose).   oxycodone (OXY-IR) 5 MG capsule Take 1 capsule (5 mg total) by mouth every 12 (twelve) hours as needed.   traZODone (DESYREL) 50 MG tablet Take 50-100 mg by mouth at bedtime.   No facility-administered encounter medications on file as of 09/18/2020.    Functional Status:  In your present state of health, do you have any difficulty performing the following activities: 08/12/2020 08/05/2020  Hearing? N N  Vision? N N  Difficulty concentrating or making decisions? N N  Walking or climbing stairs? Y Y  Comment assistive device RW secondary to right knee pain  Dressing or bathing? N N  Doing errands, shopping? [place] with tis task -  Preparing Food and eating ? N -  Using the Toilet? N -  In the past six months, have you accidently leaked urine? N -  Do you have problems with loss of bowel control? N -  Managing your Medications? N -  Managing your Finances? N -  Housekeeping or managing your Housekeeping? N -  Some recent data might be hidden    Fall/Depression Screening: Fall Risk  08/12/2020  Falls in the past year? 0   PHQ 2/9 Scores 08/12/2020  PHQ - 2 Score 0    Assessment: Rectal Bleeding                        Knee Effusion                        DVT/PE  Care Plan   Goals Addressed             This Visit's Progress    Matintain My Quality of Life   On track    Timeframe:  Long-Range Goal Priority:  Medium Start Date:   08/12/2020                          Expected End Date:   12/04/2020  Follow Up Date 09/11/2020   - discuss my treatment options with the doctor or nurse - do one enjoyable thing every day - do something different, like talking to a new person or going to a new place, every day - make shared treatment decisions with doctor - spend time outdoors at least 3 times a week   Barriers: Health Behaviors  Why is this important?   Having a long-term illness can be scary.  It can also be stressful for you and your caregiver.  These steps may help.    Notes:  09/18/20 Continues to have ongoing acute issues that are preventing her from a good quality of life. She reports she had her knee drained 3 weeks ago so that is feeling better. She continues to have some rectal bleeding and she is going to see her GI specialist today. She denies any SOB today. Encouraged her to continue to advocate for herself in getting appropriate care from the right provider in a timely manner. 6/8- Pt discussed her medical history with several back surgeries and her support system with her children. Pt also enjoys her two grandchildren. Pt lives in a mobile house and has  supportive neighbors to assist when needed with her groceries and other needed items. Discussed the above goals and interventions with much encouraged for pt to participate in increasing her mobility if possible and cleared with her provider.     THN-Make and Keep All Appointments       Timeframe:  Short-Term Goal Priority:  Medium Start Date:  08/12/2020                           Expected End Date:   10/02/2020                    Follow Up Date 09/11/2020   - arrange a ride through an agency 1 week before appointment - ask family or friend for a ride - call to cancel if needed - keep a calendar with prescription refill dates - keep a calendar with appointment dates   Barriers: Health Behaviors  Why is this important?   Part of staying healthy is seeing the doctor for follow-up care.  If you forget your appointments, there are some things you can do to stay on track.    Notes:  09/18/20 Pt has been able to attend her MD appts over the last month. 6/8-Verified pt uses [employer] for transportation and needs to call 3 days prior to her appointment date. Discussed options for a backup on transportation however mentioned she has a daughter [name] and a son who are able to assist if she needs additional assistance.         Plan: Advised NP to report to primary care manager and have her call next week for follow up. Follow-up: Patient agrees to Care Plan and Follow-up. Follow-up in 1 week(s)  Kayleen Memos C. [name], MSN, [facility] Gerontological Nurse Practitioner [facility] Care Management [phone]

## 2020-09-24 DIAGNOSIS — M1711 Unilateral primary osteoarthritis, right knee: Secondary | ICD-10-CM | POA: Diagnosis not present

## 2020-09-24 DIAGNOSIS — M25461 Effusion, right knee: Secondary | ICD-10-CM | POA: Diagnosis not present

## 2020-09-24 DIAGNOSIS — M25561 Pain in right knee: Secondary | ICD-10-CM | POA: Diagnosis not present

## 2020-09-25 ENCOUNTER — Other Ambulatory Visit: Payer: Self-pay | Admitting: *Deleted

## 2020-09-25 NOTE — Patient Outreach (Signed)
[facility]) Care Management  09/25/2020  ERNIE SAGRERO [birth year] [id number]  Telephone Assessment-Successful-Other (GI Bleed)  Spoke with pt today who indicated she is having orthopedic problem related to her right knee swelling. Pt pdg MRI for intervention. Pt states she was late to her previous appointment and rescheduled for later in August.No additional GI bleeds as pt continue to  monitor. Care plan reviewed and discussed with noted updates.  Will follow up next month after consults and pending intervention on pt's right knee along with ongoing GI bleed. Will also continue communication with pt's provider accordingly.     Goals Addressed             This Visit's Progress    THN-Make and Keep All Appointments   On track    Timeframe:  Short-Term Goal Priority:  Medium Start Date:  08/12/2020                           Expected End Date:   10/02/2020                    Follow Up Date 10/22/2020   - arrange a ride through an agency 1 week before appointment - ask family or friend for a ride - call to cancel if needed - keep a calendar with prescription refill dates - keep a calendar with appointment dates   Barriers: Health Behaviors  Why is this important?   Part of staying healthy is seeing the doctor for follow-up care.  If you forget your appointments, there are some things you can do to stay on track.    Notes:  7/22-  Reinforced pt that there are additional resources for transportation if she needs help and to call the number she has for this in advance. Pt had to rescheduled an appointment she was late upon arrival to 8/18 with her GI provider. 09/18/20 Pt has been able to attend her MD appts over the last month. Reinforced to her that there are additional resources for transportation if she needs help and to call the number she has for this in advance. 6/8-Verified pt uses [employer] for transportation and needs to call 3 days prior to her appointment  date. Discussed options for a backup on transportation however mentioned she has a daughter [name] and a son who are able to assist if she needs additional assistance.      THN-Matintain My Quality of Life   On track    Timeframe:  Long-Range Goal Priority:  Medium Start Date:   08/12/2020                          Expected End Date:   12/04/2020                   Follow Up Date: 10/22/2020   - discuss my treatment options with the doctor or nurse - do one enjoyable thing every day - do something different, like talking to a new person or going to a new place, every day - make shared treatment decisions with doctor - spend time outdoors at least 3 times a week   Barriers: Health Behaviors  Why is this important?   Having a long-term illness can be scary.  It can also be stressful for you and your caregiver.  These steps may help.    Notes:  09/18/20 Continues to have ongoing acute  issues that are preventing her from a good quality of life. She reports she had her knee drained 3 weeks ago so that is feeling better. She continues to have some rectal bleeding and she is going to see her GI specialist today. She denies any SOB today. Encouraged her to continue to advocate for herself in getting appropriate care from the right provider in a timely manner. 6/8- Pt discussed her medical history with several back surgeries and her support system with her children. Pt also enjoys her two grandchildren. Pt lives in a mobile house and has supportive neighbors to assist when needed with her groceries and other needed items. Discussed the above goals and interventions with much encouraged for pt to participate in increasing her mobility if possible and cleared with her provider.         [name], RN Care Management Coordinator [place] [facility] [phone]

## 2020-09-28 DIAGNOSIS — M25561 Pain in right knee: Secondary | ICD-10-CM | POA: Diagnosis not present

## 2020-09-28 DIAGNOSIS — S83241A Other tear of medial meniscus, current injury, right knee, initial encounter: Secondary | ICD-10-CM | POA: Diagnosis not present

## 2020-09-29 DIAGNOSIS — M25561 Pain in right knee: Secondary | ICD-10-CM | POA: Diagnosis not present

## 2020-09-29 DIAGNOSIS — M25461 Effusion, right knee: Secondary | ICD-10-CM | POA: Diagnosis not present

## 2020-10-02 DIAGNOSIS — S335XXA Sprain of ligaments of lumbar spine, initial encounter: Secondary | ICD-10-CM | POA: Diagnosis not present

## 2020-10-02 DIAGNOSIS — M1990 Unspecified osteoarthritis, unspecified site: Secondary | ICD-10-CM | POA: Diagnosis not present

## 2020-10-02 DIAGNOSIS — K429 Umbilical hernia without obstruction or gangrene: Secondary | ICD-10-CM | POA: Diagnosis not present

## 2020-10-02 DIAGNOSIS — M545 Low back pain, unspecified: Secondary | ICD-10-CM | POA: Diagnosis not present

## 2020-10-02 DIAGNOSIS — M549 Dorsalgia, unspecified: Secondary | ICD-10-CM | POA: Diagnosis not present

## 2020-10-02 DIAGNOSIS — R197 Diarrhea, unspecified: Secondary | ICD-10-CM | POA: Diagnosis not present

## 2020-10-08 ENCOUNTER — Ambulatory Visit: Payer: Medicare Other | Admitting: Orthopedic Surgery

## 2020-10-09 DIAGNOSIS — Z86718 Personal history of other venous thrombosis and embolism: Secondary | ICD-10-CM | POA: Diagnosis not present

## 2020-10-09 DIAGNOSIS — Z20822 Contact with and (suspected) exposure to covid-19: Secondary | ICD-10-CM | POA: Diagnosis not present

## 2020-10-09 DIAGNOSIS — F32A Depression, unspecified: Secondary | ICD-10-CM | POA: Diagnosis not present

## 2020-10-09 DIAGNOSIS — M25561 Pain in right knee: Secondary | ICD-10-CM | POA: Diagnosis not present

## 2020-10-09 DIAGNOSIS — R0789 Other chest pain: Secondary | ICD-10-CM | POA: Diagnosis not present

## 2020-10-09 DIAGNOSIS — M1991 Primary osteoarthritis, unspecified site: Secondary | ICD-10-CM | POA: Diagnosis not present

## 2020-10-09 DIAGNOSIS — Z79899 Other long term (current) drug therapy: Secondary | ICD-10-CM | POA: Diagnosis not present

## 2020-10-09 DIAGNOSIS — R0602 Shortness of breath: Secondary | ICD-10-CM | POA: Diagnosis not present

## 2020-10-09 DIAGNOSIS — Z885 Allergy status to narcotic agent status: Secondary | ICD-10-CM | POA: Diagnosis not present

## 2020-10-09 DIAGNOSIS — R079 Chest pain, unspecified: Secondary | ICD-10-CM | POA: Diagnosis not present

## 2020-10-09 DIAGNOSIS — Z7901 Long term (current) use of anticoagulants: Secondary | ICD-10-CM | POA: Diagnosis not present

## 2020-10-09 DIAGNOSIS — Z86711 Personal history of pulmonary embolism: Secondary | ICD-10-CM | POA: Diagnosis not present

## 2020-10-11 ENCOUNTER — Emergency Department (HOSPITAL_COMMUNITY): Payer: Medicare Other

## 2020-10-11 ENCOUNTER — Emergency Department (HOSPITAL_COMMUNITY)
Admission: EM | Admit: 2020-10-11 | Discharge: 2020-10-11 | Disposition: A | Discharge status: Home / Self Care | Payer: Medicare Other | Attending: Emergency Medicine | Admitting: Emergency Medicine

## 2020-10-11 ENCOUNTER — Other Ambulatory Visit: Payer: Self-pay

## 2020-10-11 DIAGNOSIS — W19XXXA Unspecified fall, initial encounter: Secondary | ICD-10-CM

## 2020-10-11 DIAGNOSIS — S060X0A Concussion without loss of consciousness, initial encounter: Secondary | ICD-10-CM

## 2020-10-11 DIAGNOSIS — S3991XA Unspecified injury of abdomen, initial encounter: Secondary | ICD-10-CM | POA: Insufficient documentation

## 2020-10-11 DIAGNOSIS — S0990XA Unspecified injury of head, initial encounter: Secondary | ICD-10-CM | POA: Diagnosis not present

## 2020-10-11 DIAGNOSIS — M542 Cervicalgia: Secondary | ICD-10-CM | POA: Diagnosis not present

## 2020-10-11 DIAGNOSIS — W108XXA Fall (on) (from) other stairs and steps, initial encounter: Secondary | ICD-10-CM | POA: Diagnosis not present

## 2020-10-11 DIAGNOSIS — M549 Dorsalgia, unspecified: Secondary | ICD-10-CM | POA: Diagnosis not present

## 2020-10-11 DIAGNOSIS — Z79899 Other long term (current) drug therapy: Secondary | ICD-10-CM | POA: Insufficient documentation

## 2020-10-11 DIAGNOSIS — R4020 Unspecified coma: Secondary | ICD-10-CM | POA: Diagnosis not present

## 2020-10-11 DIAGNOSIS — M545 Low back pain, unspecified: Secondary | ICD-10-CM | POA: Diagnosis not present

## 2020-10-11 DIAGNOSIS — R410 Disorientation, unspecified: Secondary | ICD-10-CM | POA: Insufficient documentation

## 2020-10-11 DIAGNOSIS — J9811 Atelectasis: Secondary | ICD-10-CM | POA: Diagnosis not present

## 2020-10-11 DIAGNOSIS — M25512 Pain in left shoulder: Secondary | ICD-10-CM | POA: Diagnosis not present

## 2020-10-11 DIAGNOSIS — M25561 Pain in right knee: Secondary | ICD-10-CM | POA: Insufficient documentation

## 2020-10-11 DIAGNOSIS — Z743 Need for continuous supervision: Secondary | ICD-10-CM | POA: Diagnosis not present

## 2020-10-11 DIAGNOSIS — R0789 Other chest pain: Secondary | ICD-10-CM | POA: Diagnosis not present

## 2020-10-11 DIAGNOSIS — Z20822 Contact with and (suspected) exposure to covid-19: Secondary | ICD-10-CM | POA: Diagnosis not present

## 2020-10-11 DIAGNOSIS — R404 Transient alteration of awareness: Secondary | ICD-10-CM | POA: Diagnosis not present

## 2020-10-11 DIAGNOSIS — R9431 Abnormal electrocardiogram [ECG] [EKG]: Secondary | ICD-10-CM | POA: Diagnosis not present

## 2020-10-11 DIAGNOSIS — Z7901 Long term (current) use of anticoagulants: Secondary | ICD-10-CM | POA: Diagnosis not present

## 2020-10-11 DIAGNOSIS — S199XXA Unspecified injury of neck, initial encounter: Secondary | ICD-10-CM | POA: Diagnosis not present

## 2020-10-11 DIAGNOSIS — Z043 Encounter for examination and observation following other accident: Secondary | ICD-10-CM | POA: Diagnosis not present

## 2020-10-11 LAB — URINALYSIS, ROUTINE W REFLEX MICROSCOPIC
Bacteria, UA: NONE SEEN
Bilirubin Urine: NEGATIVE
Glucose, UA: NEGATIVE mg/dL
Hgb urine dipstick: NEGATIVE
Ketones, ur: NEGATIVE mg/dL
Leukocytes,Ua: NEGATIVE
Nitrite: NEGATIVE
Protein, ur: NEGATIVE mg/dL
Specific Gravity, Urine: >1.046 — ABNORMAL HIGH (ref 1.005–1.030)
pH: 5 (ref 5.0–8.0)

## 2020-10-11 LAB — CBC
HCT: 38.3 % (ref 36.0–46.0)
Hemoglobin: 11.6 g/dL — ABNORMAL LOW (ref 12.0–15.0)
MCH: 26.1 pg (ref 26.0–34.0)
MCHC: 30.3 g/dL (ref 30.0–36.0)
MCV: 86.1 fL (ref 80.0–100.0)
Platelets: 394 10*3/uL (ref 150–400)
RBC: 4.45 MIL/uL (ref 3.87–5.11)
RDW: 15.9 % — ABNORMAL HIGH (ref 11.5–15.5)
WBC: 9.4 10*3/uL (ref 4.0–10.5)
nRBC: 0 % (ref 0.0–0.2)

## 2020-10-11 LAB — COMPREHENSIVE METABOLIC PANEL
ALT: 15 U/L (ref 0–44)
AST: 23 U/L (ref 15–41)
Albumin: 3.5 g/dL (ref 3.5–5.0)
Alkaline Phosphatase: 51 U/L (ref 38–126)
Anion gap: 9 (ref 5–15)
BUN: 16 mg/dL (ref 6–20)
CO2: 22 mmol/L (ref 22–32)
Calcium: 9.4 mg/dL (ref 8.9–10.3)
Chloride: 109 mmol/L (ref 98–111)
Creatinine, Ser: 0.98 mg/dL (ref 0.44–1.00)
GFR, Estimated: >60 mL/min (ref >60)
Glucose, Bld: 104 mg/dL — ABNORMAL HIGH (ref 70–99)
Potassium: 4 mmol/L (ref 3.5–5.1)
Sodium: 140 mmol/L (ref 135–145)
Total Bilirubin: 0.7 mg/dL (ref 0.3–1.2)
Total Protein: 6.5 g/dL (ref 6.5–8.1)

## 2020-10-11 LAB — I-STAT CHEM 8, ED
BUN: 20 mg/dL (ref 6–20)
Calcium, Ion: 1.15 mmol/L (ref 1.15–1.40)
Chloride: 109 mmol/L (ref 98–111)
Creatinine, Ser: 0.8 mg/dL (ref 0.44–1.00)
Glucose, Bld: 106 mg/dL — ABNORMAL HIGH (ref 70–99)
HCT: 37 % (ref 36.0–46.0)
Hemoglobin: 12.6 g/dL (ref 12.0–15.0)
Potassium: 4 mmol/L (ref 3.5–5.1)
Sodium: 142 mmol/L (ref 135–145)
TCO2: 25 mmol/L (ref 22–32)

## 2020-10-11 LAB — LACTIC ACID, PLASMA
Lactic Acid, Venous: 2.4 mmol/L (ref 0.5–1.9)
Lactic Acid, Venous: 0.9 mmol/L (ref 0.5–1.9)

## 2020-10-11 LAB — RAPID URINE DRUG SCREEN, HOSP PERFORMED
Amphetamines: NOT DETECTED
Barbiturates: NOT DETECTED
Benzodiazepines: POSITIVE — AB
Cocaine: NOT DETECTED
Opiates: NOT DETECTED
Tetrahydrocannabinol: NOT DETECTED

## 2020-10-11 LAB — TYPE AND SCREEN
ABO/RH(D): A POS
Antibody Screen: NEGATIVE

## 2020-10-11 LAB — ETHANOL: Alcohol, Ethyl (B): <10 mg/dL (ref <10)

## 2020-10-11 LAB — RESP PANEL BY RT-PCR (FLU A&B, COVID) ARPGX2
Influenza A by PCR: NEGATIVE
Influenza B by PCR: NEGATIVE
SARS Coronavirus 2 by RT PCR: NEGATIVE

## 2020-10-11 LAB — PROTIME-INR
INR: 1 (ref 0.8–1.2)
Prothrombin Time: 13 seconds (ref 11.4–15.2)

## 2020-10-11 MED ORDER — ACETAMINOPHEN 500 MG PO TABS: 1000.0000 mg | ORAL_TABLET | Freq: Four times a day (QID) | ORAL | Status: DC | PRN

## 2020-10-11 MED ORDER — IOHEXOL 300 MG/ML  SOLN
100.0000 mL | Freq: Once | INTRAMUSCULAR | Status: AC | PRN
  Administered 2020-10-11: 100 mL via INTRAVENOUS

## 2020-10-11 MED ORDER — SODIUM CHLORIDE 0.9 % IV SOLN
250.0000 mL | INTRAVENOUS | Status: DC | PRN
  Administered 2020-10-11: 250 mL via INTRAVENOUS

## 2020-10-11 MED ORDER — SODIUM CHLORIDE 0.9% FLUSH: 3.0000 mL | INTRAVENOUS | Status: DC | PRN

## 2020-10-11 MED ORDER — DIPHENHYDRAMINE HCL 50 MG/ML IJ SOLN
12.5000 mg | Freq: Once | INTRAMUSCULAR | Status: AC
  Administered 2020-10-11: 12.5 mg via INTRAVENOUS
  Filled 2020-10-11: qty 1

## 2020-10-11 MED ORDER — ONDANSETRON HCL 4 MG/2ML IJ SOLN
4.0000 mg | Freq: Once | INTRAMUSCULAR | Status: AC
  Administered 2020-10-11: 4 mg via INTRAVENOUS
  Filled 2020-10-11: qty 2

## 2020-10-11 MED ORDER — LACTATED RINGERS IV BOLUS
1000.0000 mL | Freq: Once | INTRAVENOUS | Status: AC
  Administered 2020-10-11: 1000 mL via INTRAVENOUS

## 2020-10-11 MED ORDER — FENTANYL CITRATE (PF) 100 MCG/2ML IJ SOLN
50.0000 ug | Freq: Once | INTRAMUSCULAR | Status: AC
  Administered 2020-10-11: 50 ug via INTRAVENOUS
  Filled 2020-10-11: qty 2

## 2020-10-11 MED ORDER — SODIUM CHLORIDE 0.9% FLUSH
3.0000 mL | Freq: Two times a day (BID) | INTRAVENOUS | Status: DC
  Administered 2020-10-11: 3 mL via INTRAVENOUS

## 2020-10-11 MED ORDER — METOCLOPRAMIDE HCL 5 MG/ML IJ SOLN
10.0000 mg | Freq: Once | INTRAMUSCULAR | Status: AC
  Administered 2020-10-11: 10 mg via INTRAVENOUS
  Filled 2020-10-11: qty 2

## 2020-10-11 NOTE — ED Notes (Signed)
Pt given an incentive spirometer and instructed to use 30 times an hour. Pt return demonstrated proper use.

## 2020-10-11 NOTE — ED Provider Notes (Signed)
[facility] EMERGENCY DEPARTMENT Provider Note   CSN: [id number] Arrival date & time: 10/11/20  1320     History Chief Complaint  Patient presents with   Fall   Fall level 2    Brittney Carter is a 60 y.o. female.  HPI  60 year old female presenting to the emergency department with headache after a ground-level fall on Eliquis.  Patient presented to the emergency department as a level 2 trauma due to fall on Eliquis.  She arrived GCS 13, ABC intact.  He complained of headache, neck pain, lower back pain, left shoulder pain, right knee pain.  She states that she fell when her "right knee just gave out".  She is not sure of head trauma but does not remember all events indicating possible loss of consciousness. Level 5 caveat applies due to acuity and patient confusion.  Past Medical History:  Diagnosis Date   Arthritis    Back   Depression    no medication   Dyspnea    with exertion     Patient Active Problem List   Diagnosis Date Noted   Suicidal thoughts 09/01/2020   Acute deep vein thrombosis (DVT) of distal vein of right lower extremity (HCC)    Acute pulmonary embolism ([place]) 08/04/2020   Back pain of thoracolumbar region 10/17/2017   Long-term current use of opiate analgesic 03/28/2017   Chronic pain 11/24/2016   Cervical radiculopathy at C7 12/03/2015   Sacroiliac joint pain 12/03/2015   Chronic low back pain 11/25/2014   Segmental and somatic dysfunction of pelvic region 10/28/2014   Disc degeneration, lumbar 08/12/2014   Lumbar radiculopathy 08/12/2014   Lumbar stenosis 08/12/2014   Osteoarthritis of spine with radiculopathy, lumbar region 08/12/2014     Past Surgical History:  Procedure Laterality Date   BACK SURGERY  2016   Laminectomy   BACK SURGERY  1991   Disectomy   BREAST SURGERY     Breast lift   COLONOSCOPY  10/14/2016   Dr Orlena Sheldon. Benign neoplasm of ascending colon. Anal canal bleeding   COLONOSCOPY W/ POLYPECTOMY     x 2    ent surgery     ESOPHAGOGASTRODUODENOSCOPY  10/14/2016   Dr Orlena Sheldon. Normal EGD to second portion of duodenum   Pain stimulator trail     SPINAL CORD STIMULATOR INSERTION N/A 11/24/2016   Procedure: LUMBAR SPINAL CORD STIMULATOR INSERTION;  Surgeon: Melina Schools, MD;  Location: [place];  Service: Orthopedics;  Laterality: N/A;  2.5 hrs   SPINAL CORD STIMULATOR REMOVAL N/A 06/28/2017   Procedure: Spinal cord stimulator battery removal;  Surgeon: Melina Schools, MD;  Location: [place];  Service: Orthopedics;  Laterality: N/A;   TONSILLECTOMY     age 47   TUBAL LIGATION       OB History   No obstetric history on file.     No family history on file.  Social History   Tobacco Use   Smoking status: Never   Smokeless tobacco: Never  Vaping Use   Vaping Use: Never used  Substance Use Topics   Alcohol use: No   Drug use: No    Home Medications Prior to Admission medications   Medication Sig Start Date End Date Taking? Authorizing Provider  ELIQUIS 5 MG TABS tablet Take 5 mg by mouth 2 (two) times daily. 09/29/20  Yes [provider]  escitalopram (LEXAPRO) 10 MG tablet Take 15 mg by mouth daily. 1.5 tablet for 15 mg   Yes [provider]  traZODone (DESYREL) 50 MG tablet Take 50-100 mg by mouth daily as needed for sleep. Pt takes 50 mg to 100 mg depending on if patient needs it   Yes [provider]  apixaban (ELIQUIS) 5 MG TABS tablet Take 2 tablets (10 mg total) by mouth 2 (two) times daily for 6 days, THEN 1 tablet (5 mg total) 2 (two) times daily for 24 days. 08/09/20 09/08/20  Ezekiel Slocumb, DO  Artificial Tear Solution (SYSTANE CONTACTS OP) Place 1 drop into both eyes daily as needed (dry eyes).    [provider]  cyclobenzaprine (FLEXERIL) 10 MG tablet Take 1 tablet (10 mg total) by mouth 2 (two) times daily as needed for muscle spasms. 09/01/20   Gareth Morgan, MD  diclofenac Sodium (VOLTAREN) 1 % GEL Apply 2 g topically 4 (four) times  daily as needed (right knee pain).    [provider]  escitalopram (LEXAPRO) 10 MG tablet Take 15 mg by mouth daily. 06/30/20   [provider]  hydrocortisone cream 1 % Apply topically 3 (three) times daily. 08/09/20   Nicole Kindred A, DO  magnesium oxide (MAG-OX) 400 (240 Mg) MG tablet Take 1 tablet (400 mg total) by mouth 2 (two) times daily as needed (leg cramp/spasms). 08/09/20   Ezekiel Slocumb, DO  naloxone [facility]) nasal spray 4 mg/0.1 mL Place 4 mg into the nose as needed (overdose). 04/04/19   [provider]  oxycodone (OXY-IR) 5 MG capsule Take 1 capsule (5 mg total) by mouth every 12 (twelve) hours as needed. 09/03/20   Magnant, Charles L, PA-C  traZODone (DESYREL) 50 MG tablet Take 50-100 mg by mouth at bedtime. 07/03/20   [provider]    Allergies    Gabapentin, Morphine, Morphine and related, Tramadol, Tramadol, and Gabapentin  Review of Systems   Review of Systems  Unable to perform ROS: Acuity of condition   Physical Exam Updated Vital Signs BP 107/71   Pulse (!) 56   Temp 98 F (36.7 C) (Oral)   Resp 15   Ht 5' 6"  (1.676 m)   Wt 80.3 kg   SpO2 93%   BMI 28.57 kg/m   Physical Exam Vitals and nursing note reviewed.  Constitutional:      General: She is not in acute distress.    Appearance: She is well-developed.     Comments: GCS 13, ABC intact.  Patient confused, opens eyes to voice, follows commands.  HENT:     Head: Normocephalic and atraumatic.  Eyes:     Extraocular Movements: Extraocular movements intact.     Conjunctiva/sclera: Conjunctivae normal.     Pupils: Pupils are equal, round, and reactive to light.  Neck:     Comments: No midline tenderness to palpation of the cervical spine.  C-collar in place. Cardiovascular:     Rate and Rhythm: Normal rate and regular rhythm.     Heart sounds: No murmur heard. Pulmonary:     Effort: Pulmonary effort is normal. No respiratory distress.     Breath sounds: Normal  breath sounds.  Chest:     Comments: Chest and clavicles stable to lateral compression.  Mild tenderness to palpation on the left. Abdominal:     Palpations: Abdomen is soft.     Tenderness: There is no abdominal tenderness.  Musculoskeletal:     Cervical back: Neck supple.     Comments: No midline tenderness to palpation of the thoracic spine.  Tenderness palpation of the lower  lumbar spine.  Tenderness to palpation of the lateral aspect of the right knee with possible joint effusion palpated.  Tenderness of the left shoulder with decreased range of motion.  Skin:    General: Skin is warm and dry.  Neurological:     Mental Status: She is alert.     Comments: Cranial nerves II through XII grossly intact.  Moving all 4 extremities spontaneously.  Sensation grossly intact all 4 extremities    ED Results / Procedures / Treatments   Labs (all labs ordered are listed, but only abnormal results are displayed) Labs Reviewed  COMPREHENSIVE METABOLIC PANEL - Abnormal; Notable for the following components:      Result Value   Glucose, Bld 104 (*)    All other components within normal limits  CBC - Abnormal; Notable for the following components:   Hemoglobin 11.6 (*)    RDW 15.9 (*)    All other components within normal limits  URINALYSIS, ROUTINE W REFLEX MICROSCOPIC - Abnormal; Notable for the following components:   Specific Gravity, Urine >1.046 (*)    All other components within normal limits  LACTIC ACID, PLASMA - Abnormal; Notable for the following components:   Lactic Acid, Venous 2.4 (*)    All other components within normal limits  RAPID URINE DRUG SCREEN, HOSP PERFORMED - Abnormal; Notable for the following components:   Benzodiazepines POSITIVE (*)    All other components within normal limits  I-STAT CHEM 8, ED - Abnormal; Notable for the following components:   Glucose, Bld 106 (*)    All other components within normal limits  RESP PANEL BY RT-PCR (FLU A&B, COVID) [id number]   ETHANOL  PROTIME-INR  LACTIC ACID, PLASMA  TYPE AND SCREEN  ABO/RH    EKG None  Radiology CT HEAD WO CONTRAST  Result Date: 10/11/2020 CLINICAL DATA:  60 year old female with head and neck injury from fall today. EXAM: CT HEAD WITHOUT CONTRAST CT CERVICAL SPINE WITHOUT CONTRAST TECHNIQUE: Multidetector CT imaging of the head and cervical spine was performed following the standard protocol without intravenous contrast. Multiplanar CT image reconstructions of the cervical spine were also generated. COMPARISON:  08/06/2020 head CT and prior studies FINDINGS: CT HEAD FINDINGS Brain: No evidence of acute infarction, hemorrhage, hydrocephalus, extra-axial collection or mass lesion/mass effect. Vascular: No hyperdense vessel or unexpected calcification. Skull: Normal. Negative for fracture or focal lesion. Sinuses/Orbits: No acute finding. Other: None. CT CERVICAL SPINE FINDINGS Alignment: Normal. Skull base and vertebrae: No acute fracture. No primary bone lesion or focal pathologic process. Soft tissues and spinal canal: No prevertebral fluid or swelling. No visible canal hematoma. Disc levels: Mild to moderate multilevel LEFT facet arthropathy noted. Upper chest: No acute abnormality Other: None IMPRESSION: 1. Unremarkable noncontrast head CT. 2. No static evidence of acute injury to the cervical spine. Electronically Signed   By: Margarette Canada M.D.   On: 10/11/2020 15:07   CT CHEST W CONTRAST  Result Date: 10/11/2020 CLINICAL DATA:  [place] today.  Abdominal trauma. EXAM: CT CHEST, ABDOMEN, AND PELVIS WITH CONTRAST TECHNIQUE: Multidetector CT imaging of the chest, abdomen and pelvis was performed following the standard protocol during bolus administration of intravenous contrast. CONTRAST:  192m OMNIPAQUE IOHEXOL 300 MG/ML  SOLN COMPARISON:  CT of the abdomen and pelvis on 10/02/2020 FINDINGS: CT CHEST FINDINGS Cardiovascular: No significant vascular findings. Normal heart size. No pericardial effusion.  Mediastinum/Nodes: Esophagus is normal. The visualized portion of the thyroid gland has a normal appearance. No significant mediastinal, hilar, or  axillary adenopathy. Lungs/Pleura: No pneumothorax. There are streaky areas of atelectasis primarily within the posterior lung bases. No pleural effusions or consolidations. Musculoskeletal: No chest wall mass or suspicious bone lesions identified. Numerous remote anterior RIGHT rib fractures are present. No acute fracture seen. CT ABDOMEN PELVIS FINDINGS Hepatobiliary: Tiny low-attenuation lesion measures 3 millimeters in the anterior segment of the RIGHT hepatic lobe, likely benign but not further characterized. No evidence for hepatic injury. Gallbladder is decompressed. Pancreas: Unremarkable. No pancreatic ductal dilatation or surrounding inflammatory changes. Spleen: Normal in size without focal abnormality. Adrenals/Urinary Tract: Adrenal glands are normal. Kidneys are symmetric in enhancement and size. No definite intrarenal stones. Symmetric excretion of contrast. No hydronephrosis or ureteral abnormality. The bladder and visualized portion of the urethra are normal. Stomach/Bowel: Stomach is within normal limits. Appendix appears normal. No evidence of bowel wall thickening, distention, or inflammatory changes. Vascular/Lymphatic: No significant vascular findings are present. No enlarged abdominal or pelvic lymph nodes. Reproductive: Uterus is present. 7 millimeter hyperdense focus in the RIGHT ovary shows long-term stability since 2013. RIGHT ovary is normal in size. LEFT ovary is unremarkable. No free pelvic fluid. Other: Focal edema or bruising identified in the LEFT flank region. Musculoskeletal: No acute or significant osseous findings. IMPRESSION: 1. No acute abnormality of the chest, abdomen, or pelvis. 2. Minimal bibasilar atelectasis. 3. No acute fracture.  Remote anterior RIGHT rib fractures. 4. Minimal edema or bruising of the LEFT flank region.  Electronically Signed   By: Nolon Nations M.D.   On: 10/11/2020 15:25   CT CERVICAL SPINE WO CONTRAST  Result Date: 10/11/2020 CLINICAL DATA:  60 year old female with head and neck injury from fall today. EXAM: CT HEAD WITHOUT CONTRAST CT CERVICAL SPINE WITHOUT CONTRAST TECHNIQUE: Multidetector CT imaging of the head and cervical spine was performed following the standard protocol without intravenous contrast. Multiplanar CT image reconstructions of the cervical spine were also generated. COMPARISON:  08/06/2020 head CT and prior studies FINDINGS: CT HEAD FINDINGS Brain: No evidence of acute infarction, hemorrhage, hydrocephalus, extra-axial collection or mass lesion/mass effect. Vascular: No hyperdense vessel or unexpected calcification. Skull: Normal. Negative for fracture or focal lesion. Sinuses/Orbits: No acute finding. Other: None. CT CERVICAL SPINE FINDINGS Alignment: Normal. Skull base and vertebrae: No acute fracture. No primary bone lesion or focal pathologic process. Soft tissues and spinal canal: No prevertebral fluid or swelling. No visible canal hematoma. Disc levels: Mild to moderate multilevel LEFT facet arthropathy noted. Upper chest: No acute abnormality Other: None IMPRESSION: 1. Unremarkable noncontrast head CT. 2. No static evidence of acute injury to the cervical spine. Electronically Signed   By: Margarette Canada M.D.   On: 10/11/2020 15:07   CT ABDOMEN PELVIS W CONTRAST  Result Date: 10/11/2020 CLINICAL DATA:  [place] today.  Abdominal trauma. EXAM: CT CHEST, ABDOMEN, AND PELVIS WITH CONTRAST TECHNIQUE: Multidetector CT imaging of the chest, abdomen and pelvis was performed following the standard protocol during bolus administration of intravenous contrast. CONTRAST:  155m OMNIPAQUE IOHEXOL 300 MG/ML  SOLN COMPARISON:  CT of the abdomen and pelvis on 10/02/2020 FINDINGS: CT CHEST FINDINGS Cardiovascular: No significant vascular findings. Normal heart size. No pericardial effusion.  Mediastinum/Nodes: Esophagus is normal. The visualized portion of the thyroid gland has a normal appearance. No significant mediastinal, hilar, or axillary adenopathy. Lungs/Pleura: No pneumothorax. There are streaky areas of atelectasis primarily within the posterior lung bases. No pleural effusions or consolidations. Musculoskeletal: No chest wall mass or suspicious bone lesions identified. Numerous remote anterior RIGHT rib fractures are  present. No acute fracture seen. CT ABDOMEN PELVIS FINDINGS Hepatobiliary: Tiny low-attenuation lesion measures 3 millimeters in the anterior segment of the RIGHT hepatic lobe, likely benign but not further characterized. No evidence for hepatic injury. Gallbladder is decompressed. Pancreas: Unremarkable. No pancreatic ductal dilatation or surrounding inflammatory changes. Spleen: Normal in size without focal abnormality. Adrenals/Urinary Tract: Adrenal glands are normal. Kidneys are symmetric in enhancement and size. No definite intrarenal stones. Symmetric excretion of contrast. No hydronephrosis or ureteral abnormality. The bladder and visualized portion of the urethra are normal. Stomach/Bowel: Stomach is within normal limits. Appendix appears normal. No evidence of bowel wall thickening, distention, or inflammatory changes. Vascular/Lymphatic: No significant vascular findings are present. No enlarged abdominal or pelvic lymph nodes. Reproductive: Uterus is present. 7 millimeter hyperdense focus in the RIGHT ovary shows long-term stability since 2013. RIGHT ovary is normal in size. LEFT ovary is unremarkable. No free pelvic fluid. Other: Focal edema or bruising identified in the LEFT flank region. Musculoskeletal: No acute or significant osseous findings. IMPRESSION: 1. No acute abnormality of the chest, abdomen, or pelvis. 2. Minimal bibasilar atelectasis. 3. No acute fracture.  Remote anterior RIGHT rib fractures. 4. Minimal edema or bruising of the LEFT flank region.  Electronically Signed   By: Nolon Nations M.D.   On: 10/11/2020 15:25   DG Pelvis Portable  Result Date: 10/11/2020 CLINICAL DATA:  Fall, chronic anticoagulation EXAM: PORTABLE PELVIS 1-2 VIEWS COMPARISON:  None. FINDINGS: There is no evidence of pelvic fracture or diastasis. No pelvic bone lesions are seen. IMPRESSION: Negative. Electronically Signed   By: Fidela Salisbury MD   On: 10/11/2020 13:48   CT T-SPINE NO CHARGE  Result Date: 10/11/2020 CLINICAL DATA:  Fall. EXAM: CT THORACIC AND LUMBAR SPINE WITH CONTRAST TECHNIQUE: Multiplanar CT images of the thoracic and lumbar spine were reconstructed from contemporary CT of the Chest, Abdomen, and Pelvis CONTRAST:  No additional COMPARISON:  CT abdomen and pelvis 10/02/2020. CTA chest 08/17/2020. Lumbar CT myelogram 10/17/2017. FINDINGS: CT THORACIC SPINE FINDINGS Alignment: Normal. Vertebrae: No acute fracture or suspicious osseous lesion. Chronic fracture of the T8 spinous process. Prior laminectomies at T9. Paraspinal and other soft tissues: No acute abnormality identified in the paraspinal soft tissues. Intrathoracic contents reported separately. Disc levels: Mild thoracic disc degeneration and moderate thoracic facet arthrosis. No evidence of high-grade spinal canal or neural foraminal stenosis. CT LUMBAR SPINE FINDINGS Segmentation: 5 lumbar type vertebrae. Alignment: Normal. Vertebrae: No acute fracture or suspicious osseous lesion. Paraspinal and other soft tissues: No acute abnormality identified in the paraspinal soft tissues. Intra-abdominal and pelvic contents reported separately. Disc levels: Moderate disc space narrowing at L4-5 and mild narrowing at L5-S1, unchanged from the prior myelogram. Prior left laminectomy at L4-5. Chronic mild spinal stenosis at L3-4 due to disc bulging and facet hypertrophy. No high-grade neural foraminal stenosis. IMPRESSION: No acute osseous abnormality identified in the thoracic or lumbar spine. Electronically  Signed   By: Logan Bores M.D.   On: 10/11/2020 15:38   CT L-SPINE NO CHARGE  Result Date: 10/11/2020 CLINICAL DATA:  Fall. EXAM: CT THORACIC AND LUMBAR SPINE WITH CONTRAST TECHNIQUE: Multiplanar CT images of the thoracic and lumbar spine were reconstructed from contemporary CT of the Chest, Abdomen, and Pelvis CONTRAST:  No additional COMPARISON:  CT abdomen and pelvis 10/02/2020. CTA chest 08/17/2020. Lumbar CT myelogram 10/17/2017. FINDINGS: CT THORACIC SPINE FINDINGS Alignment: Normal. Vertebrae: No acute fracture or suspicious osseous lesion. Chronic fracture of the T8 spinous process. Prior laminectomies at T9. Paraspinal  and other soft tissues: No acute abnormality identified in the paraspinal soft tissues. Intrathoracic contents reported separately. Disc levels: Mild thoracic disc degeneration and moderate thoracic facet arthrosis. No evidence of high-grade spinal canal or neural foraminal stenosis. CT LUMBAR SPINE FINDINGS Segmentation: 5 lumbar type vertebrae. Alignment: Normal. Vertebrae: No acute fracture or suspicious osseous lesion. Paraspinal and other soft tissues: No acute abnormality identified in the paraspinal soft tissues. Intra-abdominal and pelvic contents reported separately. Disc levels: Moderate disc space narrowing at L4-5 and mild narrowing at L5-S1, unchanged from the prior myelogram. Prior left laminectomy at L4-5. Chronic mild spinal stenosis at L3-4 due to disc bulging and facet hypertrophy. No high-grade neural foraminal stenosis. IMPRESSION: No acute osseous abnormality identified in the thoracic or lumbar spine. Electronically Signed   By: Logan Bores M.D.   On: 10/11/2020 15:38   DG Chest Port 1 View  Result Date: 10/11/2020 CLINICAL DATA:  Fall, chronic anticoagulation EXAM: PORTABLE CHEST 1 VIEW COMPARISON:  10/09/2020 FINDINGS: Lungs volumes are small, but are symmetric and are clear. No pneumothorax or pleural effusion. Cardiac size within normal limits. Pulmonary  vascularity is normal. Osseous structures are age-appropriate. No acute bone abnormality. IMPRESSION: No active disease. Electronically Signed   By: Fidela Salisbury MD   On: 10/11/2020 13:47   DG Shoulder Left Port  Result Date: 10/11/2020 CLINICAL DATA:  15-year-old female status post fall. EXAM: LEFT SHOULDER COMPARISON:  None. FINDINGS: There is no evidence of fracture or dislocation. There is no evidence of arthropathy or other focal bone abnormality. Soft tissues are unremarkable. IMPRESSION: No acute fracture or malalignment. Electronically Signed   By: Ruthann Cancer MD   On: 10/11/2020 15:21   DG Knee Right Port  Result Date: 10/11/2020 CLINICAL DATA:  [place].  Right knee pain. EXAM: PORTABLE RIGHT KNEE - 1-2 VIEW COMPARISON:  Radiograph 09/08/2020 FINDINGS: The joint spaces are maintained. No acute fracture or osteochondral lesion. There is a suprapatellar knee joint effusion noted. IMPRESSION: 1. No acute bony findings. 2. Suprapatellar knee joint effusion. Electronically Signed   By: Marijo Sanes M.D.   On: 10/11/2020 15:25   DG Ankle Left Port  Result Date: 10/11/2020 CLINICAL DATA:  76-year-old female status post fall. EXAM: PORTABLE LEFT ANKLE - 2 VIEW COMPARISON:  None. FINDINGS: There is no evidence of fracture, dislocation, or joint effusion. There is no evidence of arthropathy or other focal bone abnormality. Soft tissues are unremarkable. IMPRESSION: No acute fracture or malalignment. Electronically Signed   By: Ruthann Cancer MD   On: 10/11/2020 15:24    Procedures Procedures   Medications Ordered in ED Medications  fentaNYL (SUBLIMAZE) injection 50 mcg (50 mcg Intravenous Given 10/11/20 1425)  ondansetron (ZOFRAN) injection 4 mg (4 mg Intravenous Given 10/11/20 1424)  iohexol (OMNIPAQUE) 300 MG/ML solution 100 mL (100 mLs Intravenous Contrast Given 10/11/20 1507)  lactated ringers bolus 1,000 mL (0 mLs Intravenous Stopped 10/11/20 1801)  metoCLOPramide (REGLAN) injection 10 mg (10 mg  Intravenous Given 10/11/20 1652)  diphenhydrAMINE (BENADRYL) injection 12.5 mg (12.5 mg Intravenous Given 10/11/20 1652)    ED Course  I have reviewed the triage vital signs and the nursing notes.  Pertinent labs & imaging results that were available during my care of the patient were reviewed by me and considered in my medical decision making (see chart for details).    MDM Rules/Calculators/A&P  Brittney Carter is a 60 y.o. female who presents by EMS as a Level 2 Trauma patient after fall on anticoagulation as per above.  Currently, she is awake, alert, and protecting  own airway and is hemodynamically stable.  GCS 13, ABC intact.  C-collar in place, complaining of headache and knee pain.   The patient received IV fluids, IV pain control  while in the ED. the patient had a brief episode of hypotension following IV Dilaudid administration which resolved with fluid resuscitation.  She remained hemodynamically stable after IV fluid resuscitation.  Trauma imaging revealed (full reports in EMR): Portable CXR:  No evidence of pneumothorax or tracheal deviation Portable Pelvis:  No evidence of acute hip fracture or malalignment FAST:  Not performed CT scans (pan-scan): CT head with no acute intracranial abnormality, CT cervical spine with no acute injury to the cervical spine, CT chest, abdomen pelvis with no acute abnormality of the chest abdomen or pelvis.  She did have remote anterior right rib fractures and minimal edema in the left flank region noted.  No fractures noted of the T or L-spine. Other Xrs: X-ray left ankle, right knee, left shoulder negative for acute fractures or malalignment.  A suprapatellar knee joint effusion was noted.  The patient is able to range her knee.  Labs: The patient had a mild lactic acidosis which resolved following fluid resuscitation.  Remainder of laboratory work-up reviewed and unremarkable.     Following negative work-up, the  patient was ambulatory in the emergency department and cleared for discharge home.  Advised NSAIDs for pain control and PCP follow-up.  Final Clinical Impression(s) / ED Diagnoses Final diagnoses:  Fall  Concussion without loss of consciousness, initial encounter    Rx / DC Orders ED Discharge Orders     None        Regan Lemming, MD 10/12/20 1425

## 2020-10-11 NOTE — Progress Notes (Signed)
Orthopedic Tech Progress Note Patient Details:  Brittney Carter 1961/02/19 [id number] Level 2 trauma Patient ID: [name], female   DOB: 02/25/1961, 60 y.o.   MRN: [id number]  [name] 10/11/2020, 1:36 PM

## 2020-10-11 NOTE — ED Triage Notes (Signed)
TRN triage n ote-  Pt to ED via [facility] after falling , missing step. Pt is on Eliquis for DVT and PE hx. Pt is moaning continuously when Dr. Armandina Gemma is assessing her, anywhere he palpates, she yells out in pain. Does not remember the fall

## 2020-10-11 NOTE — ED Notes (Signed)
Notified Dr Armandina Gemma of pt's bp, bolus of LR is going.

## 2020-10-11 NOTE — ED Notes (Signed)
Pt transported to CT with TRN

## 2020-10-11 NOTE — ED Notes (Signed)
Pt moaning in continual pain-- pain meds given- is oriented, but still does not remember falling

## 2020-10-12 ENCOUNTER — Telehealth: Payer: Self-pay

## 2020-10-12 LAB — ABO/RH: ABO/RH(D): A POS

## 2020-10-12 NOTE — Telephone Encounter (Signed)
Patient walked into the office this afternoon asking if she could be seen by Dr Marlou Sa today. [name], at the front desk advised her, after speaking with [name] and I, that we unfortunately could not see her today but would be happy to work her in on Wednesday. She was asking to be seen for knee pain and knee giving way.  [name] did advise but patient was resistant stating she demanded to be seen today and would sit here all afternoon if needed. I spoke further with Dr Marlou Sa and he too said patient could not be seen today due to large patient clinic volume but we would be happy to see her Wednesday. I advised patient of this. She verbalized understanding and stated she would have to think about it.

## 2020-10-15 DIAGNOSIS — M25561 Pain in right knee: Secondary | ICD-10-CM | POA: Diagnosis not present

## 2020-10-22 ENCOUNTER — Other Ambulatory Visit: Payer: Self-pay | Admitting: *Deleted

## 2020-10-22 ENCOUNTER — Ambulatory Visit: Payer: Medicare Other | Admitting: Gastroenterology

## 2020-10-22 NOTE — Patient Outreach (Signed)
[facility]) Care Management  10/22/2020  Brittney Carter 20-May-1960 [id number]   Telephone Assessment-Successful  RN spoke with pt today and updated the discussed plan of care. Pt states her provider have recommended surgery due to her ongoing swelling to her right knee. RN inquired about any additional issues related to her recent ED visits. Pt states no additional anxiety or falls due to her right knee "going out". Pt is hopeful that the pending surgery will resolve these risk/issues. Pt verified she has the information and utilizing the provided resources from the care-guides [facility]) with no mentioned of delays.   Will follow up next month with pt's ongoing progress related to her pending surgery. Will continue communication with her provider at that time for quarterly update.    Goals Addressed             This Visit's Progress    THN-Make and Keep All Appointments   On track    Timeframe:  Short-Term Goal Priority:  Medium Start Date:  08/12/2020                           Expected End Date:   12/04/2020                    Follow Up Date 11/19/2020   - arrange a ride through an agency 1 week before appointment - ask family or friend for a ride - call to cancel if needed - keep a calendar with prescription refill dates - keep a calendar with appointment dates   Barriers: Health Behaviors  Why is this important?   Part of staying healthy is seeing the doctor for follow-up care.  If you forget your appointments, there are some things you can do to stay on track.    Notes:  8/18-Pt states she is doing well with all scheduled appointments and verified transportation sources with no additional needs related to the information provided via care-guides. States she is awaiting call today to verify a scheduled date for knee surgery. States the doctors are not ble to remove any additional fluid from her knee and has recommended surgery.  7/22-  Reinforced pt that  there are additional resources for transportation if she needs help and to call the number she has for this in advance. Pt had to rescheduled an appointment she was late upon arrival to 8/18 with her GI provider. 09/18/20 Pt has been able to attend her MD appts over the last month. Reinforced to her that there are additional resources for transportation if she needs help and to call the number she has for this in advance. 6/8-Verified pt uses [employer] for transportation and needs to call 3 days prior to her appointment date. Discussed options for a backup on transportation however mentioned she has a daughter Brittney Carter and a son who are able to assist if she needs additional assistance.      THN-Matintain My Quality of Life   On track    Timeframe:  Long-Range Goal Priority:  Medium Start Date:   08/12/2020                          Expected End Date:   12/04/2020                   Follow Up Date: 11/19/2020   - discuss my treatment options with the doctor or nurse -  do one enjoyable thing every day - do something different, like talking to a new person or going to a new place, every day - make shared treatment decisions with doctor - spend time outdoors at least 3 times a week   Barriers: Health Behaviors  Why is this important?   Having a long-term illness can be scary.  It can also be stressful for you and your caregiver.  These steps may help.    Notes:  09/18/20 Continues to have ongoing acute issues that are preventing her from a good quality of life. She reports she had her knee drained 3 weeks ago so that is feeling better. She continues to have some rectal bleeding and she is going to see her GI specialist today. She denies any SOB today. Encouraged her to continue to advocate for herself in getting appropriate care from the right provider in a timely manner. 6/8- Pt discussed her medical history with several back surgeries and her support system with her children. Pt also enjoys her two  grandchildren. Pt lives in a mobile house and has supportive neighbors to assist when needed with her groceries and other needed items. Discussed the above goals and interventions with much encouraged for pt to participate in increasing her mobility if possible and cleared with her provider.       [name], RN Care Management Coordinator [place] Office [phone]

## 2020-10-27 DIAGNOSIS — Z01818 Encounter for other preprocedural examination: Secondary | ICD-10-CM | POA: Diagnosis not present

## 2020-10-30 ENCOUNTER — Encounter: Payer: Self-pay | Admitting: Gastroenterology

## 2020-10-30 DIAGNOSIS — R519 Headache, unspecified: Secondary | ICD-10-CM | POA: Diagnosis not present

## 2020-10-30 DIAGNOSIS — I82401 Acute embolism and thrombosis of unspecified deep veins of right lower extremity: Secondary | ICD-10-CM | POA: Diagnosis not present

## 2020-10-30 DIAGNOSIS — H538 Other visual disturbances: Secondary | ICD-10-CM | POA: Diagnosis not present

## 2020-10-30 DIAGNOSIS — H539 Unspecified visual disturbance: Secondary | ICD-10-CM | POA: Diagnosis not present

## 2020-10-30 DIAGNOSIS — R0602 Shortness of breath: Secondary | ICD-10-CM | POA: Diagnosis not present

## 2020-10-30 DIAGNOSIS — I825Z9 Chronic embolism and thrombosis of unspecified deep veins of unspecified distal lower extremity: Secondary | ICD-10-CM | POA: Diagnosis not present

## 2020-10-31 DIAGNOSIS — R519 Headache, unspecified: Secondary | ICD-10-CM | POA: Diagnosis not present

## 2020-10-31 DIAGNOSIS — I825Z9 Chronic embolism and thrombosis of unspecified deep veins of unspecified distal lower extremity: Secondary | ICD-10-CM | POA: Diagnosis not present

## 2020-10-31 DIAGNOSIS — H538 Other visual disturbances: Secondary | ICD-10-CM | POA: Diagnosis not present

## 2020-11-01 DIAGNOSIS — Z86711 Personal history of pulmonary embolism: Secondary | ICD-10-CM | POA: Diagnosis not present

## 2020-11-01 DIAGNOSIS — Z7901 Long term (current) use of anticoagulants: Secondary | ICD-10-CM | POA: Diagnosis not present

## 2020-11-01 DIAGNOSIS — I825Z9 Chronic embolism and thrombosis of unspecified deep veins of unspecified distal lower extremity: Secondary | ICD-10-CM | POA: Diagnosis not present

## 2020-11-01 DIAGNOSIS — H538 Other visual disturbances: Secondary | ICD-10-CM | POA: Diagnosis not present

## 2020-11-01 DIAGNOSIS — G8929 Other chronic pain: Secondary | ICD-10-CM | POA: Diagnosis not present

## 2020-11-01 DIAGNOSIS — R200 Anesthesia of skin: Secondary | ICD-10-CM | POA: Diagnosis not present

## 2020-11-01 DIAGNOSIS — M79604 Pain in right leg: Secondary | ICD-10-CM | POA: Diagnosis not present

## 2020-11-01 DIAGNOSIS — R0602 Shortness of breath: Secondary | ICD-10-CM | POA: Diagnosis not present

## 2020-11-01 DIAGNOSIS — M549 Dorsalgia, unspecified: Secondary | ICD-10-CM | POA: Diagnosis not present

## 2020-11-01 DIAGNOSIS — M542 Cervicalgia: Secondary | ICD-10-CM | POA: Diagnosis not present

## 2020-11-01 DIAGNOSIS — R29700 NIHSS score 0: Secondary | ICD-10-CM | POA: Diagnosis not present

## 2020-11-01 DIAGNOSIS — R001 Bradycardia, unspecified: Secondary | ICD-10-CM | POA: Diagnosis not present

## 2020-11-01 DIAGNOSIS — E785 Hyperlipidemia, unspecified: Secondary | ICD-10-CM | POA: Diagnosis not present

## 2020-11-01 DIAGNOSIS — F32A Depression, unspecified: Secondary | ICD-10-CM | POA: Diagnosis not present

## 2020-11-01 DIAGNOSIS — R519 Headache, unspecified: Secondary | ICD-10-CM | POA: Diagnosis not present

## 2020-11-02 ENCOUNTER — Ambulatory Visit: Payer: Medicare Other | Admitting: Gastroenterology

## 2020-11-02 DIAGNOSIS — R519 Headache, unspecified: Secondary | ICD-10-CM | POA: Diagnosis not present

## 2020-11-02 DIAGNOSIS — H538 Other visual disturbances: Secondary | ICD-10-CM | POA: Diagnosis not present

## 2020-11-02 DIAGNOSIS — I825Z9 Chronic embolism and thrombosis of unspecified deep veins of unspecified distal lower extremity: Secondary | ICD-10-CM | POA: Diagnosis not present

## 2020-11-03 DIAGNOSIS — R001 Bradycardia, unspecified: Secondary | ICD-10-CM | POA: Diagnosis not present

## 2020-11-03 DIAGNOSIS — I825Z9 Chronic embolism and thrombosis of unspecified deep veins of unspecified distal lower extremity: Secondary | ICD-10-CM | POA: Diagnosis not present

## 2020-11-03 DIAGNOSIS — H538 Other visual disturbances: Secondary | ICD-10-CM | POA: Diagnosis not present

## 2020-11-03 DIAGNOSIS — R519 Headache, unspecified: Secondary | ICD-10-CM | POA: Diagnosis not present

## 2020-11-04 DIAGNOSIS — H538 Other visual disturbances: Secondary | ICD-10-CM | POA: Diagnosis not present

## 2020-11-04 DIAGNOSIS — R519 Headache, unspecified: Secondary | ICD-10-CM | POA: Diagnosis not present

## 2020-11-04 DIAGNOSIS — I825Z9 Chronic embolism and thrombosis of unspecified deep veins of unspecified distal lower extremity: Secondary | ICD-10-CM | POA: Diagnosis not present

## 2020-11-07 ENCOUNTER — Emergency Department (HOSPITAL_COMMUNITY)
Admission: EM | Admit: 2020-11-07 | Discharge: 2020-11-07 | Disposition: A | Discharge status: Home / Self Care | Payer: Medicare Other | Attending: Emergency Medicine | Admitting: Emergency Medicine

## 2020-11-07 ENCOUNTER — Other Ambulatory Visit: Payer: Self-pay

## 2020-11-07 ENCOUNTER — Encounter (HOSPITAL_COMMUNITY): Payer: Self-pay

## 2020-11-07 DIAGNOSIS — Z7901 Long term (current) use of anticoagulants: Secondary | ICD-10-CM | POA: Insufficient documentation

## 2020-11-07 DIAGNOSIS — F606 Avoidant personality disorder: Secondary | ICD-10-CM | POA: Insufficient documentation

## 2020-11-07 DIAGNOSIS — K625 Hemorrhage of anus and rectum: Secondary | ICD-10-CM | POA: Diagnosis present

## 2020-11-07 DIAGNOSIS — R519 Headache, unspecified: Secondary | ICD-10-CM | POA: Insufficient documentation

## 2020-11-07 LAB — COMPREHENSIVE METABOLIC PANEL
ALT: 15 U/L (ref 0–44)
AST: 17 U/L (ref 15–41)
Albumin: 3.8 g/dL (ref 3.5–5.0)
Alkaline Phosphatase: 56 U/L (ref 38–126)
Anion gap: 7 (ref 5–15)
BUN: 13 mg/dL (ref 6–20)
CO2: 25 mmol/L (ref 22–32)
Calcium: 10 mg/dL (ref 8.9–10.3)
Chloride: 110 mmol/L (ref 98–111)
Creatinine, Ser: 0.77 mg/dL (ref 0.44–1.00)
GFR, Estimated: >60 mL/min (ref >60)
Glucose, Bld: 116 mg/dL — ABNORMAL HIGH (ref 70–99)
Potassium: 3.8 mmol/L (ref 3.5–5.1)
Sodium: 142 mmol/L (ref 135–145)
Total Bilirubin: 0.6 mg/dL (ref 0.3–1.2)
Total Protein: 7.4 g/dL (ref 6.5–8.1)

## 2020-11-07 LAB — CBC
HCT: 38.9 % (ref 36.0–46.0)
Hemoglobin: 12.2 g/dL (ref 12.0–15.0)
MCH: 26.2 pg (ref 26.0–34.0)
MCHC: 31.4 g/dL (ref 30.0–36.0)
MCV: 83.5 fL (ref 80.0–100.0)
Platelets: 392 10*3/uL (ref 150–400)
RBC: 4.66 MIL/uL (ref 3.87–5.11)
RDW: 16 % — ABNORMAL HIGH (ref 11.5–15.5)
WBC: 6.4 10*3/uL (ref 4.0–10.5)
nRBC: 0 % (ref 0.0–0.2)

## 2020-11-07 LAB — TYPE AND SCREEN
ABO/RH(D): A POS
Antibody Screen: NEGATIVE

## 2020-11-07 LAB — PROTIME-INR
INR: 1.1 (ref 0.8–1.2)
Prothrombin Time: 14.1 seconds (ref 11.4–15.2)

## 2020-11-07 LAB — POC OCCULT BLOOD, ED: Fecal Occult Bld: POSITIVE — AB

## 2020-11-07 NOTE — ED Notes (Signed)
Positive orthostatics

## 2020-11-07 NOTE — ED Triage Notes (Addendum)
Pt c/o abdominal burning with rectal bleeding x2 since 7am today. Pt also c/o dizziness and weakness. Pt states she is taking Eliquis.

## 2020-11-07 NOTE — Discharge Instructions (Addendum)
Do not take your Eliquis today.  Follow-up with gastroenterology at the beginning of this week when the offices are open.  Return to the ER if you have recurrent symptoms

## 2020-11-07 NOTE — ED Notes (Signed)
Provider at pt bedside

## 2020-11-07 NOTE — ED Provider Notes (Signed)
[facility] DEPT Provider Note   CSN: [id number] Arrival date & time: 11/07/20  7416     History Chief Complaint  Patient presents with   Rectal Bleeding    Since 7am    Brittney Carter is a 60 y.o. female.   Rectal Bleeding   Pt states she was at [facility] for problems with possible stroke and headache.  Pt states while at the hospital staff were stealing medications and she ended up leaving.  She is going to get an MRI as an outpatient instead of getting it done in the hospital as they recommended.  However this morning, p she noticed blood in the stool.  It started this am.  It was a small amount but she was concerned because she does take Eliquis.  She has not had any further episodes of rectal bleeding.  No hematemesis.  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  Past Medical History:  Diagnosis Date   Arthritis    Back   Depression    no medication   Dyspnea    with exertion     Patient Active Problem List   Diagnosis Date Noted   Suicidal thoughts 09/01/2020   Acute deep vein thrombosis (DVT) of distal vein of right lower extremity (HCC)    Acute pulmonary embolism ([place]) 08/04/2020   Back pain of thoracolumbar region 10/17/2017   Long-term current use of opiate analgesic 03/28/2017   Chronic pain 11/24/2016   Cervical radiculopathy at C7 12/03/2015   Sacroiliac joint pain 12/03/2015   Chronic low back pain 11/25/2014   Segmental and somatic dysfunction of pelvic region 10/28/2014   Disc degeneration, lumbar 08/12/2014   Lumbar radiculopathy 08/12/2014   Lumbar stenosis 08/12/2014   Osteoarthritis of spine with radiculopathy, 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 Benign neoplasm of ascending colon. Anal canal bleeding    COLONOSCOPY W/ POLYPECTOMY     x 2   ent surgery     ESOPHAGOGASTRODUODENOSCOPY  10/14/2016   Dr Orlena Sheldon. Normal EGD to second portion of duodenum   Pain stimulator trail     SPINAL CORD STIMULATOR INSERTION N/A 11/24/2016   Procedure: LUMBAR SPINAL CORD STIMULATOR INSERTION;  Surgeon: Melina Schools, MD;  Location: [facility];  Service: Orthopedics;  Laterality: N/A;  2.5 hrs   SPINAL CORD STIMULATOR REMOVAL N/A 06/28/2017   Procedure: Spinal cord stimulator battery removal;  Surgeon: Melina Schools, MD;  Location: [facility];  Service: Orthopedics;  Laterality: N/A;   TONSILLECTOMY     age 34   TUBAL LIGATION       OB History   No obstetric history on file.     History reviewed. No pertinent family history.  Social History   Tobacco Use   Smoking status: Never   Smokeless tobacco: Never  Vaping Use   Vaping Use: Never used  Substance Use Topics   Alcohol use: No   Drug use: No    Home Medications Prior to Admission medications   Medication Sig Start Date End Date Taking? Authorizing Provider  apixaban (ELIQUIS) 5 MG TABS tablet Take 2 tablets (10 mg total) by mouth 2 (two) times daily for 6 days, THEN 1 tablet (5  mg total) 2 (two) times daily for 24 days. 08/09/20 09/08/20  Ezekiel Slocumb, DO  Artificial Tear Solution (SYSTANE CONTACTS OP) Place 1 drop into both eyes daily as needed (dry eyes).    [provider]  cyclobenzaprine (FLEXERIL) 10 MG tablet Take 1 tablet (10 mg total) by mouth 2 (two) times daily as needed for muscle spasms. 09/01/20   Gareth Morgan, MD  diclofenac Sodium (VOLTAREN) 1 % GEL Apply 2 g topically 4 (four) times daily as needed (right knee pain).    [provider]  ELIQUIS 5 MG TABS tablet Take 5 mg by mouth 2 (two) times daily. 09/29/20   [provider]  escitalopram (LEXAPRO) 10 MG tablet Take 15 mg by mouth daily. 06/30/20   [provider]  escitalopram (LEXAPRO) 10 MG tablet Take 15 mg by mouth daily. 1.5 tablet  for 15 mg    [provider]  hydrocortisone cream 1 % Apply topically 3 (three) times daily. 08/09/20   Nicole Kindred A, DO  magnesium oxide (MAG-OX) 400 (240 Mg) MG tablet Take 1 tablet (400 mg total) by mouth 2 (two) times daily as needed (leg cramp/spasms). 08/09/20   Ezekiel Slocumb, DO  naloxone [facility]) nasal spray 4 mg/0.1 mL Place 4 mg into the nose as needed (overdose). 04/04/19   [provider]  oxycodone (OXY-IR) 5 MG capsule Take 1 capsule (5 mg total) by mouth every 12 (twelve) hours as needed. 09/03/20   Magnant, Charles L, PA-C  traZODone (DESYREL) 50 MG tablet Take 50-100 mg by mouth at bedtime. 07/03/20   [provider]  traZODone (DESYREL) 50 MG tablet Take 50-100 mg by mouth daily as needed for sleep. Pt takes 50 mg to 100 mg depending on if patient needs it    [provider]    Allergies    Gabapentin, Morphine, Morphine and related, Tramadol, Tramadol, and Gabapentin  Review of Systems   Review of Systems  Gastrointestinal:  Positive for hematochezia.  All other systems reviewed and are negative.  Physical Exam Updated Vital Signs BP (!) 140/95 (BP Location: Left Arm)   Pulse 72   Temp 97.8 F (36.6 C) (Oral)   Resp 18   Ht 1.676 m (5' 6" )   Wt 79.4 kg   SpO2 98%   BMI 28.25 kg/m   Physical Exam Vitals and nursing note reviewed.  Constitutional:      General: She is not in acute distress.    Appearance: She is well-developed.  HENT:     Head: Normocephalic and atraumatic.     Right Ear: External ear normal.     Left Ear: External ear normal.  Eyes:     General: No scleral icterus.       Right eye: No discharge.        Left eye: No discharge.     Conjunctiva/sclera: Conjunctivae normal.  Neck:     Trachea: No tracheal deviation.  Cardiovascular:     Rate and Rhythm: Normal rate and regular rhythm.  Pulmonary:     Effort: Pulmonary effort is normal. No respiratory distress.     Breath sounds: Normal breath  sounds. No stridor. No wheezing or rales.  Abdominal:     General: Bowel sounds are normal. There is no distension.     Palpations: Abdomen is soft.     Tenderness: There is no abdominal tenderness. There is no guarding or rebound.  Genitourinary:    Comments: No mass, no bright  red blood noted on rectal exam, no melena Musculoskeletal:        General: No tenderness or deformity.     Cervical back: Neck supple.  Skin:    General: Skin is warm and dry.     Findings: No rash.  Neurological:     General: No focal deficit present.     Mental Status: She is alert.     Cranial Nerves: No cranial nerve deficit (no facial droop, extraocular movements intact, no slurred speech).     Sensory: No sensory deficit.     Motor: No abnormal muscle tone or seizure activity.     Coordination: Coordination normal.  Psychiatric:        Mood and Affect: Mood is anxious.    ED Results / Procedures / Treatments   Labs (all labs ordered are listed, but only abnormal results are displayed) Labs Reviewed  COMPREHENSIVE METABOLIC PANEL - Abnormal; Notable for the following components:      Result Value   Glucose, Bld 116 (*)    All other components within normal limits  CBC - Abnormal; Notable for the following components:   RDW 16.0 (*)    All other components within normal limits  POC OCCULT BLOOD, ED - Abnormal; Notable for the following components:   Fecal Occult Bld POSITIVE (*)    All other components within normal limits  PROTIME-INR  TYPE AND SCREEN    EKG None  Radiology No results found.  Procedures Procedures   Medications Ordered in ED Medications - No data to display  ED Course  I have reviewed the triage vital signs and the nursing notes.  Pertinent labs & imaging results that were available during my care of the patient were reviewed by me and considered in my medical decision making (see chart for details).  Clinical Course as of 11/07/20 1428  Sat Nov 07, 2020  1302  Labs reviewed.  CBC normal.  Metabolic panel normal.  INR normal [JK]  1331 Hemoccult positive [JK]  1400 Case discussed with Dr. Paulita Fujita.  Recommends having the patient hold her Eliquis today.  She can follow-up in the GI office as an outpatient.   [JK]    Clinical Course User Index [JK] Dorie Rank, MD   MDM Rules/Calculators/A&P                           Patient presented to the ED with complaints of rectal bleeding.  Patient is on Eliquis.  Patient had 1 episode this morning of scant red blood in her stool.  No episodes since.  ED evaluation is reassuring.  She has normal hemoglobin hematocrit.  Patient's not had any rectal bleeding for several hours now.  Doubt colitis diverticulitis.  No gross blood noted on rectal exam the stool was brown but was guaiac positive.  Discussed case with Dr. Paulita Fujita, gastroenterology.  We will have the patient hold her Eliquis today.  Patient will follow-up with GI as an outpatient.  Warning signs and precautions discussed with the patient to return to the ED for recurrent bleeding or other concerning symptoms Final Clinical Impression(s) / ED Diagnoses Final diagnoses:  Rectal bleeding    Rx / DC Orders ED Discharge Orders     None        Dorie Rank, MD 11/07/20 1430

## 2020-11-19 ENCOUNTER — Other Ambulatory Visit: Payer: Self-pay | Admitting: *Deleted

## 2020-11-19 NOTE — Patient Outreach (Signed)
[facility]) Care Management  11/19/2020  SHENICA HOLZHEIMER September 04, 1960 [id number]  Telephone Assessment-Unsuccessful  RN attempted outreach unsuccessful. RN able to leave a HIPAA approved voice message requesting a call back.   Will follow up next week with another outreach call.  [name], RN Care Management Coordinator [place] Office [phone]

## 2020-11-23 ENCOUNTER — Other Ambulatory Visit: Payer: Self-pay | Admitting: *Deleted

## 2020-11-23 NOTE — Patient Outreach (Signed)
[facility]) Care Management  11/23/2020  Brittney Carter 1960/09/08 [id number]  Telephone Assessment-RE: call back  RN spoke briefly with pt today who indicated she just had knee surgery and was in route home. States she still feels like she is under anesthesia. Pt has requested a call back later in the week.  RN will follow up as requested later this week. Pt was not admitted and had a same day procedure.  [name], RN Care Management Coordinator [place] Office [phone]

## 2020-11-25 ENCOUNTER — Inpatient Hospital Stay (HOSPITAL_COMMUNITY)
Admission: EM | Admit: 2020-11-25 | Discharge: 2020-11-29 | DRG: 378 | Disposition: A | Discharge status: Home / Self Care | Payer: Medicare Other | Attending: Internal Medicine | Admitting: Internal Medicine

## 2020-11-25 ENCOUNTER — Encounter (HOSPITAL_COMMUNITY): Payer: Self-pay

## 2020-11-25 ENCOUNTER — Emergency Department (HOSPITAL_COMMUNITY): Payer: Medicare Other

## 2020-11-25 ENCOUNTER — Other Ambulatory Visit: Payer: Self-pay

## 2020-11-25 DIAGNOSIS — Z539 Procedure and treatment not carried out, unspecified reason: Secondary | ICD-10-CM | POA: Diagnosis not present

## 2020-11-25 DIAGNOSIS — Z888 Allergy status to other drugs, medicaments and biological substances status: Secondary | ICD-10-CM

## 2020-11-25 DIAGNOSIS — K921 Melena: Secondary | ICD-10-CM | POA: Diagnosis not present

## 2020-11-25 DIAGNOSIS — K5900 Constipation, unspecified: Secondary | ICD-10-CM

## 2020-11-25 DIAGNOSIS — Z86010 Personal history of colonic polyps: Secondary | ICD-10-CM

## 2020-11-25 DIAGNOSIS — Z86711 Personal history of pulmonary embolism: Secondary | ICD-10-CM

## 2020-11-25 DIAGNOSIS — K625 Hemorrhage of anus and rectum: Secondary | ICD-10-CM | POA: Diagnosis present

## 2020-11-25 DIAGNOSIS — Z86718 Personal history of other venous thrombosis and embolism: Secondary | ICD-10-CM

## 2020-11-25 DIAGNOSIS — Z79899 Other long term (current) drug therapy: Secondary | ICD-10-CM

## 2020-11-25 DIAGNOSIS — R1030 Lower abdominal pain, unspecified: Secondary | ICD-10-CM

## 2020-11-25 DIAGNOSIS — I959 Hypotension, unspecified: Secondary | ICD-10-CM

## 2020-11-25 DIAGNOSIS — Z23 Encounter for immunization: Secondary | ICD-10-CM

## 2020-11-25 DIAGNOSIS — K648 Other hemorrhoids: Secondary | ICD-10-CM | POA: Diagnosis present

## 2020-11-25 DIAGNOSIS — K644 Residual hemorrhoidal skin tags: Secondary | ICD-10-CM | POA: Diagnosis present

## 2020-11-25 DIAGNOSIS — G8929 Other chronic pain: Secondary | ICD-10-CM | POA: Diagnosis present

## 2020-11-25 DIAGNOSIS — I82401 Acute embolism and thrombosis of unspecified deep veins of right lower extremity: Secondary | ICD-10-CM | POA: Diagnosis present

## 2020-11-25 DIAGNOSIS — Z800 Family history of malignant neoplasm of digestive organs: Secondary | ICD-10-CM

## 2020-11-25 DIAGNOSIS — F32A Depression, unspecified: Secondary | ICD-10-CM | POA: Diagnosis present

## 2020-11-25 DIAGNOSIS — Z7901 Long term (current) use of anticoagulants: Secondary | ICD-10-CM

## 2020-11-25 DIAGNOSIS — M479 Spondylosis, unspecified: Secondary | ICD-10-CM | POA: Diagnosis present

## 2020-11-25 DIAGNOSIS — Z9889 Other specified postprocedural states: Secondary | ICD-10-CM

## 2020-11-25 DIAGNOSIS — Z885 Allergy status to narcotic agent status: Secondary | ICD-10-CM

## 2020-11-25 DIAGNOSIS — K922 Gastrointestinal hemorrhage, unspecified: Secondary | ICD-10-CM

## 2020-11-25 DIAGNOSIS — Z20822 Contact with and (suspected) exposure to covid-19: Secondary | ICD-10-CM | POA: Diagnosis present

## 2020-11-25 LAB — CBC WITH DIFFERENTIAL/PLATELET
Abs Immature Granulocytes: 0.02 10*3/uL (ref 0.00–0.07)
Basophils Absolute: 0.1 10*3/uL (ref 0.0–0.1)
Basophils Relative: 1 %
Eosinophils Absolute: 0.1 10*3/uL (ref 0.0–0.5)
Eosinophils Relative: 1 %
HCT: 35.5 % — ABNORMAL LOW (ref 36.0–46.0)
Hemoglobin: 11.3 g/dL — ABNORMAL LOW (ref 12.0–15.0)
Immature Granulocytes: 0 %
Lymphocytes Relative: 24 %
Lymphs Abs: 1.7 10*3/uL (ref 0.7–4.0)
MCH: 26.8 pg (ref 26.0–34.0)
MCHC: 31.8 g/dL (ref 30.0–36.0)
MCV: 84.3 fL (ref 80.0–100.0)
Monocytes Absolute: 0.5 10*3/uL (ref 0.1–1.0)
Monocytes Relative: 7 %
Neutro Abs: 4.8 10*3/uL (ref 1.7–7.7)
Neutrophils Relative %: 67 %
Platelets: 339 10*3/uL (ref 150–400)
RBC: 4.21 MIL/uL (ref 3.87–5.11)
RDW: 16.3 % — ABNORMAL HIGH (ref 11.5–15.5)
WBC: 7.1 10*3/uL (ref 4.0–10.5)
nRBC: 0 % (ref 0.0–0.2)

## 2020-11-25 LAB — COMPREHENSIVE METABOLIC PANEL
ALT: 14 U/L (ref 0–44)
AST: 21 U/L (ref 15–41)
Albumin: 4.1 g/dL (ref 3.5–5.0)
Alkaline Phosphatase: 53 U/L (ref 38–126)
Anion gap: 7 (ref 5–15)
BUN: 11 mg/dL (ref 6–20)
CO2: 25 mmol/L (ref 22–32)
Calcium: 9.4 mg/dL (ref 8.9–10.3)
Chloride: 104 mmol/L (ref 98–111)
Creatinine, Ser: 0.81 mg/dL (ref 0.44–1.00)
GFR, Estimated: >60 mL/min (ref >60)
Glucose, Bld: 109 mg/dL — ABNORMAL HIGH (ref 70–99)
Potassium: 3.9 mmol/L (ref 3.5–5.1)
Sodium: 136 mmol/L (ref 135–145)
Total Bilirubin: 0.5 mg/dL (ref 0.3–1.2)
Total Protein: 7.6 g/dL (ref 6.5–8.1)

## 2020-11-25 LAB — TYPE AND SCREEN
ABO/RH(D): A POS
Antibody Screen: NEGATIVE

## 2020-11-25 LAB — LIPASE, BLOOD: Lipase: 23 U/L (ref 11–51)

## 2020-11-25 MED ORDER — IOHEXOL 350 MG/ML SOLN
100.0000 mL | Freq: Once | INTRAVENOUS | Status: AC | PRN
  Administered 2020-11-25: 100 mL via INTRAVENOUS

## 2020-11-25 MED ORDER — HYDROMORPHONE HCL 1 MG/ML IJ SOLN
0.5000 mg | INTRAMUSCULAR | Status: AC | PRN
  Administered 2020-11-25 – 2020-11-26 (×2): 0.5 mg via INTRAVENOUS
  Filled 2020-11-25 (×2): qty 1

## 2020-11-25 MED ORDER — ONDANSETRON HCL 4 MG/2ML IJ SOLN
4.0000 mg | Freq: Once | INTRAMUSCULAR | Status: AC
  Administered 2020-11-25: 4 mg via INTRAVENOUS
  Filled 2020-11-25: qty 2

## 2020-11-25 NOTE — ED Triage Notes (Signed)
Abd pain described as burning with radiation through to back and bright red rectal bleeding x 1 week with dizziness and weakness. Pt on eliquis.

## 2020-11-25 NOTE — ED Provider Notes (Signed)
[facility] DEPT Provider Note   CSN: [id number] Arrival date & time: 11/25/20  1513     History Chief Complaint  Patient presents with   Rectal Bleeding    Cattleya I Breach is a 60 y.o. 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  Patient states that she has had a history of rectal bleeding while on her Eliquis and was last seen in the emergency department on 11/07/2020 for a similar presentation.  She was ultimately discharged due to a normal hemoglobin with outpatient GI follow-up but she is unable to obtain this until October.  She recently went off of her Eliquis for her right knee surgery and restarted her Eliquis leading to multiple episodes of bright red blood per rectum.  She also endorses right left lower quadrant tenderness that she feels is progressively worsening and making it difficult to tolerate p.o.  Denies chest pain, shortness of breath, headache, cough, fever or other systemic symptoms.   Rectal Bleeding Associated symptoms: abdominal pain   Associated symptoms: no fever and no vomiting       Past Medical History:  Diagnosis Date   Arthritis    Back   Depression    no medication   Dyspnea    with exertion     Patient Active Problem List   Diagnosis Date Noted   Rectal bleeding 11/25/2020   Suicidal thoughts 09/01/2020   Acute deep vein thrombosis (DVT) of distal vein of right lower extremity (HCC)    Acute pulmonary embolism ([place]) 08/04/2020   Back pain of thoracolumbar region 10/17/2017   Long-term current use of opiate analgesic 03/28/2017   Chronic pain 11/24/2016   Cervical radiculopathy at C7 12/03/2015   Sacroiliac joint pain 12/03/2015   Chronic low back pain 11/25/2014   Segmental and somatic dysfunction of pelvic region 10/28/2014   Disc degeneration, lumbar  08/12/2014   Lumbar radiculopathy 08/12/2014   Lumbar stenosis 08/12/2014   Osteoarthritis of spine with radiculopathy, 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 Benign neoplasm of ascending colon. Anal canal bleeding   COLONOSCOPY W/ POLYPECTOMY     x 2   ent surgery     ESOPHAGOGASTRODUODENOSCOPY  10/14/2016   Dr Orlena Sheldon. Normal EGD to second portion of duodenum   Pain stimulator trail     SPINAL CORD STIMULATOR INSERTION N/A 11/24/2016   Procedure: LUMBAR SPINAL CORD STIMULATOR INSERTION;  Surgeon: Melina Schools, MD;  Location: [facility];  Service: Orthopedics;  Laterality: N/A;  2.5 hrs   SPINAL CORD STIMULATOR REMOVAL N/A 06/28/2017   Procedure: Spinal cord stimulator battery removal;  Surgeon: Melina Schools, MD;  Location: [facility];  Service: Orthopedics;  Laterality: N/A;   TONSILLECTOMY     age 7   TUBAL LIGATION       OB History   No obstetric history on file.     History reviewed. No pertinent family history.  Social History   Tobacco Use   Smoking status: Never   Smokeless tobacco: Never  Vaping Use   Vaping Use: Never used  Substance Use Topics   Alcohol use: No   Drug  use: No    Home Medications Prior to Admission medications   Medication Sig Start Date End Date Taking? Authorizing Provider  Artificial Tear Solution (SYSTANE CONTACTS OP) Place 1 drop into both eyes daily as needed (dry eyes).   Yes [provider]  ELIQUIS 5 MG TABS tablet Take 5 mg by mouth 2 (two) times daily. 09/29/20  Yes [provider]  escitalopram (LEXAPRO) 10 MG tablet Take 15 mg by mouth daily. 06/30/20  Yes [provider]  PERCOCET 5-325 MG tablet Take 1 tablet by mouth every 8 (eight) hours as needed. 11/23/20  Yes [provider]  traZODone (DESYREL) 50 MG tablet  Take 50-100 mg by mouth at bedtime. 07/03/20  Yes [provider]  apixaban (ELIQUIS) 5 MG TABS tablet Take 2 tablets (10 mg total) by mouth 2 (two) times daily for 6 days, THEN 1 tablet (5 mg total) 2 (two) times daily for 24 days. 08/09/20 09/08/20  Ezekiel Slocumb, DO  cyclobenzaprine (FLEXERIL) 10 MG tablet Take 1 tablet (10 mg total) by mouth 2 (two) times daily as needed for muscle spasms. Patient not taking: Reported on 11/25/2020 09/01/20   Gareth Morgan, MD  diclofenac Sodium (VOLTAREN) 1 % GEL Apply 2 g topically 4 (four) times daily as needed (right knee pain).    [provider]  hydrocortisone cream 1 % Apply topically 3 (three) times daily. Patient not taking: Reported on 11/25/2020 08/09/20   Nicole Kindred A, DO  magnesium oxide (MAG-OX) 400 (240 Mg) MG tablet Take 1 tablet (400 mg total) by mouth 2 (two) times daily as needed (leg cramp/spasms). 08/09/20   Ezekiel Slocumb, DO  naloxone [facility]) nasal spray 4 mg/0.1 mL Place 4 mg into the nose as needed (overdose). 04/04/19   [provider]  oxycodone (OXY-IR) 5 MG capsule Take 1 capsule (5 mg total) by mouth every 12 (twelve) hours as needed. Patient not taking: Reported on 11/25/2020 09/03/20   Magnant, Gerrianne Scale, PA-C    Allergies    Gabapentin, Morphine, Tramadol, and Hydrocodone-ibuprofen  Review of Systems   Review of Systems  Constitutional:  Negative for chills and fever.  HENT:  Negative for ear pain and sore throat.   Eyes:  Negative for pain and visual disturbance.  Respiratory:  Negative for cough and shortness of breath.   Cardiovascular:  Negative for chest pain and palpitations.  Gastrointestinal:  Positive for abdominal pain, blood in stool and hematochezia. Negative for vomiting.  Genitourinary:  Negative for dysuria and hematuria.  Musculoskeletal:  Negative for arthralgias and back pain.  Skin:  Negative for color change and rash.  Neurological:  Negative for seizures and syncope.   All other systems reviewed and are negative.  Physical Exam Updated Vital Signs BP 121/71   Pulse 60   Temp 98 F (36.7 C)   Resp 18   Ht 5' 6"  (1.676 m)   Wt 79 kg   SpO2 95%   BMI 28.11 kg/m   Physical Exam Vitals and nursing note reviewed.  Constitutional:      General: She is not in acute distress.    Appearance: She is well-developed.  HENT:     Head: Normocephalic and atraumatic.  Eyes:     Conjunctiva/sclera: Conjunctivae normal.  Cardiovascular:     Rate and Rhythm: Normal rate and regular rhythm.     Heart sounds: No murmur heard. Pulmonary:     Effort: Pulmonary effort is normal. No respiratory distress.  Breath sounds: Normal breath sounds.  Abdominal:     Palpations: Abdomen is soft.     Tenderness: There is abdominal tenderness (RLQ, LLQ).  Musculoskeletal:     Cervical back: Neck supple.  Skin:    General: Skin is warm and dry.  Neurological:     Mental Status: She is alert.    ED Results / Procedures / Treatments   Labs (all labs ordered are listed, but only abnormal results are displayed) Labs Reviewed  COMPREHENSIVE METABOLIC PANEL - Abnormal; Notable for the following components:      Result Value   Glucose, Bld 109 (*)    All other components within normal limits  CBC WITH DIFFERENTIAL/PLATELET - Abnormal; Notable for the following components:   Hemoglobin 11.3 (*)    HCT 35.5 (*)    RDW 16.3 (*)    All other components within normal limits  LIPASE, BLOOD  TYPE AND SCREEN    EKG None  Radiology CT Angio Abd/Pel W and/or Wo Contrast  Result Date: 11/25/2020 CLINICAL DATA:  Bright red blood per rectum.  GI bleed. EXAM: CTA ABDOMEN AND PELVIS WITHOUT AND WITH CONTRAST TECHNIQUE: Multidetector CT imaging of the abdomen and pelvis was performed using the standard protocol during bolus administration of intravenous contrast. Multiplanar reconstructed images and MIPs were obtained and reviewed to evaluate the vascular anatomy. CONTRAST:   158m OMNIPAQUE IOHEXOL 350 MG/ML SOLN COMPARISON:  CT dated 07/22/2020. FINDINGS: VASCULAR Aorta: Normal caliber aorta without aneurysm, dissection, vasculitis or significant stenosis. Celiac: Patent without evidence of aneurysm, dissection, vasculitis or significant stenosis. SMA: Patent without evidence of aneurysm, dissection, vasculitis or significant stenosis. Renals: Both renal arteries are patent without evidence of aneurysm, dissection, vasculitis, fibromuscular dysplasia or significant stenosis. IMA: Patent without evidence of aneurysm, dissection, vasculitis or significant stenosis. Inflow: Patent without evidence of aneurysm, dissection, vasculitis or significant stenosis. Proximal Outflow: Bilateral common femoral and visualized portions of the superficial and profunda femoral arteries are patent without evidence of aneurysm, dissection, vasculitis or significant stenosis. Veins: The IVC is unremarkable.  No portal venous gas. Review of the MIP images confirms the above findings. NON-VASCULAR Lower chest: Minimal bibasilar linear atelectasis/scarring. The visualized lung bases are otherwise clear. No intra-abdominal free air or free fluid. Hepatobiliary: The liver is unremarkable. No intrahepatic biliary dilatation. Layering sludge within the gallbladder. No pericholecystic fluid. Pancreas: Unremarkable. No pancreatic ductal dilatation or surrounding inflammatory changes. Spleen: Normal in size without focal abnormality. Adrenals/Urinary Tract: The adrenal glands are unremarkable. The kidneys, visualized ureters, and urinary bladder appear unremarkable. Stomach/Bowel: There is moderate stool throughout the colon. There is no bowel obstruction or active inflammation. No extravasated contrast or evidence of active GI bleed. The appendix is normal. Lymphatic: No adenopathy. Reproductive: The uterus and ovaries are grossly unremarkable. Other: None Musculoskeletal: No acute or significant osseous findings.  IMPRESSION: 1. No acute intra-abdominal or pelvic pathology. No evidence of active GI bleed. 2. Moderate colonic stool burden. No bowel obstruction. Normal appendix. 3. Gallbladder sludge. Electronically Signed   By: AAnner CreteM.D.   On: 11/25/2020 22:39    Procedures Procedures   Medications Ordered in ED Medications  HYDROmorphone (DILAUDID) injection 0.5 mg (0.5 mg Intravenous Given 11/25/20 2142)  ondansetron (ZOFRAN) injection 4 mg (4 mg Intravenous Given 11/25/20 2141)  iohexol (OMNIPAQUE) 350 MG/ML injection 100 mL (100 mLs Intravenous Contrast Given 11/25/20 2220)    ED Course  I have reviewed the triage vital signs and the nursing notes.  Pertinent labs &  imaging results that were available during my care of the patient were reviewed by me and considered in my medical decision making (see chart for details).    MDM Rules/Calculators/A&P                           Patient seen the emergency department for evaluation of rectal bleeding.  Physical exam reveals tenderness in the right left lower quadrants that is mild.  [facility] evaluation reveals a hemoglobin of 11.3 which is down from 12.2 1 month ago but is otherwise unremarkable.  CT angio unremarkable with no acute bleed.  GI was consulted who states that they will round on the patient tomorrow morning.  Suspect the patients symptoms are secondary to a diverticular bleed on Eliquis.  Patient admitted to medicine for restratification of starting or stopping Eliquis and GI consultation tomorrow morning. Final Clinical Impression(s) / ED Diagnoses Final diagnoses:  None    Rx / DC Orders ED Discharge Orders     None        Orville Widmann, Debe Coder, MD 11/25/20 [phone]

## 2020-11-25 NOTE — ED Provider Notes (Signed)
Emergency Medicine Provider Triage Evaluation Note  LYNDALL BELLOT , a 60 y.o. female  was evaluated in triage.  Pt complains of rectal bleeding.  Patient was initially seen earlier this month due to rectal bleeding.  She had a reassuring work-up and was discharged home in stable condition after discussing her case with GI.  She states that she has a follow-up appointment with GI on October 10.  1 week ago she began experiencing rectal bleeding once again.  She states it is occurred daily for the past week.  She states she is anticoagulated on Eliquis for prior DVT.  Reports associated dizziness as well as weakness.  Also complains of burning abdominal pain that radiates to her back.  She states that she had knee surgery 2 days ago and discontinue her Eliquis for 2 days prior to this but restarted her Eliquis yesterday.  Physical Exam  BP 125/88   Pulse 90   Temp 98 F (36.7 C)   Resp 18   Ht 5' 6"  (1.676 m)   Wt 79 kg   SpO2 98%   BMI 28.11 kg/m  Gen:   Awake, no distress   Resp:  Normal effort  MSK:   Moves extremities without difficulty  Other:    Medical Decision Making  Medically screening exam initiated at 3:30 PM.  Appropriate orders placed.  [name] was informed that the remainder of the evaluation will be completed by another provider, this initial triage assessment does not replace that evaluation, and the importance of remaining in the ED until their evaluation is complete.   Rayna Sexton, PA-C 49/67/59 [id number]    Lianne Cure, DO 46/65/99 [phone]

## 2020-11-26 DIAGNOSIS — K625 Hemorrhage of anus and rectum: Secondary | ICD-10-CM

## 2020-11-26 DIAGNOSIS — K5900 Constipation, unspecified: Secondary | ICD-10-CM

## 2020-11-26 DIAGNOSIS — R1030 Lower abdominal pain, unspecified: Secondary | ICD-10-CM

## 2020-11-26 DIAGNOSIS — Z9889 Other specified postprocedural states: Secondary | ICD-10-CM | POA: Diagnosis not present

## 2020-11-26 DIAGNOSIS — Z885 Allergy status to narcotic agent status: Secondary | ICD-10-CM | POA: Diagnosis not present

## 2020-11-26 DIAGNOSIS — I952 Hypotension due to drugs: Secondary | ICD-10-CM

## 2020-11-26 DIAGNOSIS — Z86718 Personal history of other venous thrombosis and embolism: Secondary | ICD-10-CM | POA: Diagnosis not present

## 2020-11-26 DIAGNOSIS — Z86711 Personal history of pulmonary embolism: Secondary | ICD-10-CM | POA: Diagnosis not present

## 2020-11-26 DIAGNOSIS — F1120 Opioid dependence, uncomplicated: Secondary | ICD-10-CM | POA: Diagnosis not present

## 2020-11-26 DIAGNOSIS — Z79899 Other long term (current) drug therapy: Secondary | ICD-10-CM | POA: Diagnosis not present

## 2020-11-26 DIAGNOSIS — K648 Other hemorrhoids: Secondary | ICD-10-CM | POA: Diagnosis present

## 2020-11-26 DIAGNOSIS — Z20822 Contact with and (suspected) exposure to covid-19: Secondary | ICD-10-CM | POA: Diagnosis present

## 2020-11-26 DIAGNOSIS — I959 Hypotension, unspecified: Secondary | ICD-10-CM

## 2020-11-26 DIAGNOSIS — Z800 Family history of malignant neoplasm of digestive organs: Secondary | ICD-10-CM

## 2020-11-26 DIAGNOSIS — K921 Melena: Secondary | ICD-10-CM

## 2020-11-26 DIAGNOSIS — Z23 Encounter for immunization: Secondary | ICD-10-CM | POA: Diagnosis present

## 2020-11-26 DIAGNOSIS — Z539 Procedure and treatment not carried out, unspecified reason: Secondary | ICD-10-CM | POA: Diagnosis not present

## 2020-11-26 DIAGNOSIS — F32A Depression, unspecified: Secondary | ICD-10-CM | POA: Diagnosis present

## 2020-11-26 DIAGNOSIS — Z7901 Long term (current) use of anticoagulants: Secondary | ICD-10-CM | POA: Diagnosis not present

## 2020-11-26 DIAGNOSIS — K644 Residual hemorrhoidal skin tags: Secondary | ICD-10-CM | POA: Diagnosis present

## 2020-11-26 DIAGNOSIS — G8929 Other chronic pain: Secondary | ICD-10-CM | POA: Diagnosis present

## 2020-11-26 DIAGNOSIS — Z86010 Personal history of colonic polyps: Secondary | ICD-10-CM | POA: Diagnosis not present

## 2020-11-26 DIAGNOSIS — I82401 Acute embolism and thrombosis of unspecified deep veins of right lower extremity: Secondary | ICD-10-CM | POA: Diagnosis present

## 2020-11-26 DIAGNOSIS — Z888 Allergy status to other drugs, medicaments and biological substances status: Secondary | ICD-10-CM | POA: Diagnosis not present

## 2020-11-26 DIAGNOSIS — M545 Low back pain, unspecified: Secondary | ICD-10-CM | POA: Diagnosis not present

## 2020-11-26 DIAGNOSIS — M479 Spondylosis, unspecified: Secondary | ICD-10-CM | POA: Diagnosis present

## 2020-11-26 LAB — CBC
HCT: 30.3 % — ABNORMAL LOW (ref 36.0–46.0)
HCT: 31 % — ABNORMAL LOW (ref 36.0–46.0)
Hemoglobin: 9.4 g/dL — ABNORMAL LOW (ref 12.0–15.0)
Hemoglobin: 9.7 g/dL — ABNORMAL LOW (ref 12.0–15.0)
MCH: 26.4 pg (ref 26.0–34.0)
MCH: 26.6 pg (ref 26.0–34.0)
MCHC: 31 g/dL (ref 30.0–36.0)
MCHC: 31.3 g/dL (ref 30.0–36.0)
MCV: 85.1 fL (ref 80.0–100.0)
MCV: 84.9 fL (ref 80.0–100.0)
Platelets: 266 10*3/uL (ref 150–400)
Platelets: 291 10*3/uL (ref 150–400)
RBC: 3.56 MIL/uL — ABNORMAL LOW (ref 3.87–5.11)
RBC: 3.65 MIL/uL — ABNORMAL LOW (ref 3.87–5.11)
RDW: 16.5 % — ABNORMAL HIGH (ref 11.5–15.5)
RDW: 16.3 % — ABNORMAL HIGH (ref 11.5–15.5)
WBC: 6 10*3/uL (ref 4.0–10.5)
WBC: 6 10*3/uL (ref 4.0–10.5)
nRBC: 0 % (ref 0.0–0.2)
nRBC: 0 % (ref 0.0–0.2)

## 2020-11-26 LAB — RESP PANEL BY RT-PCR (FLU A&B, COVID) ARPGX2
Influenza A by PCR: NEGATIVE
Influenza B by PCR: NEGATIVE
SARS Coronavirus 2 by RT PCR: NEGATIVE

## 2020-11-26 LAB — URINALYSIS, ROUTINE W REFLEX MICROSCOPIC
Bilirubin Urine: NEGATIVE
Glucose, UA: NEGATIVE mg/dL
Ketones, ur: NEGATIVE mg/dL
Leukocytes,Ua: NEGATIVE
Nitrite: NEGATIVE
Protein, ur: NEGATIVE mg/dL
Specific Gravity, Urine: 1.025 (ref 1.005–1.030)
pH: 5.5 (ref 5.0–8.0)

## 2020-11-26 LAB — URINALYSIS, MICROSCOPIC (REFLEX)
Bacteria, UA: NONE SEEN
Squamous Epithelial / HPF: NONE SEEN (ref 0–5)

## 2020-11-26 MED ORDER — INFLUENZA VAC SPLIT QUAD 0.5 ML IM SUSY
0.5000 mL | PREFILLED_SYRINGE | INTRAMUSCULAR | Status: AC
  Administered 2020-11-29: 0.5 mL via INTRAMUSCULAR
  Filled 2020-11-26: qty 0.5

## 2020-11-26 MED ORDER — SODIUM CHLORIDE 0.9 % IV SOLN: INTRAVENOUS | Status: DC

## 2020-11-26 MED ORDER — ACETAMINOPHEN 325 MG PO TABS: 650.0000 mg | ORAL_TABLET | Freq: Four times a day (QID) | ORAL | Status: DC | PRN

## 2020-11-26 MED ORDER — POLYVINYL ALCOHOL 1.4 % OP SOLN: 1.0000 [drp] | OPHTHALMIC | Status: DC | PRN

## 2020-11-26 MED ORDER — HYDROMORPHONE HCL 1 MG/ML IJ SOLN
0.5000 mg | INTRAMUSCULAR | Status: DC | PRN
  Administered 2020-11-26 – 2020-11-29 (×17): 0.5 mg via INTRAVENOUS
  Filled 2020-11-26 (×17): qty 0.5

## 2020-11-26 MED ORDER — HYDROMORPHONE HCL 1 MG/ML IJ SOLN
0.5000 mg | Freq: Once | INTRAMUSCULAR | Status: AC
  Administered 2020-11-26: 0.5 mg via INTRAVENOUS
  Filled 2020-11-26: qty 0.5

## 2020-11-26 MED ORDER — SYSTANE CONTACTS OP SOLN: 1.0000 [drp] | Freq: Every day | OPHTHALMIC | Status: DC | PRN

## 2020-11-26 MED ORDER — ESCITALOPRAM OXALATE 10 MG PO TABS
15.0000 mg | ORAL_TABLET | Freq: Every day | ORAL | Status: DC
  Administered 2020-11-26 – 2020-11-29 (×4): 15 mg via ORAL
  Filled 2020-11-26 (×4): qty 2

## 2020-11-26 MED ORDER — DICYCLOMINE HCL 20 MG PO TABS
20.0000 mg | ORAL_TABLET | Freq: Three times a day (TID) | ORAL | Status: DC
  Administered 2020-11-26 – 2020-11-29 (×6): 20 mg via ORAL
  Filled 2020-11-26 (×6): qty 1

## 2020-11-26 MED ORDER — SODIUM CHLORIDE 0.9 % IV BOLUS
500.0000 mL | Freq: Once | INTRAVENOUS | Status: AC
  Administered 2020-11-26: 500 mL via INTRAVENOUS

## 2020-11-26 MED ORDER — OXYCODONE-ACETAMINOPHEN 5-325 MG PO TABS
1.0000 | ORAL_TABLET | Freq: Three times a day (TID) | ORAL | Status: DC | PRN
  Administered 2020-11-26 – 2020-11-29 (×9): 1 via ORAL
  Filled 2020-11-26 (×9): qty 1

## 2020-11-26 MED ORDER — CYCLOBENZAPRINE HCL 10 MG PO TABS
10.0000 mg | ORAL_TABLET | Freq: Two times a day (BID) | ORAL | Status: DC | PRN
  Administered 2020-11-26 – 2020-11-27 (×3): 10 mg via ORAL
  Filled 2020-11-26 (×3): qty 1

## 2020-11-26 MED ORDER — TRAZODONE HCL 100 MG PO TABS
50.0000 mg | ORAL_TABLET | Freq: Every day | ORAL | Status: DC
  Administered 2020-11-26 – 2020-11-28 (×4): 100 mg via ORAL
  Filled 2020-11-26 (×4): qty 1

## 2020-11-26 NOTE — H&P (Signed)
History and Physical    Brittney Carter IEP:[id number] DOB: Jul 06, 1960 DOA: 11/25/2020  PCP: [name], MD  Patient coming from: Home  I have personally briefly reviewed patient's old medical records in [place]  Chief Complaint: Rectal bleed  HPI: Brittney Carter is a 60 y.o. female with medical history significant for history of GI bleed, PE/DVT on Eliquis, chronic back pain who presents with concerns of rectal bleeding.  Patient reports she has been having rectal bleeding multiple times a day with clots for the past 7 days straight.  Has felt diffuse abdominal burning and worsening low back pain that feels different from her chronic pain.  Denies any nausea and vomiting but has had decreased appetite.  Has felt chills and hot. Has noted palpation and occasional chest pain associated with shortness of breath. Feels she is short of breath even speaking. Denies LE calf pain or cramps. She recently had right knee surgery for a meniscal tear and had stopped her Eliquis reports 5 days ago and resumed yesterday.  Had a dose of Eliquis this morning.  Feels knee pain much improved after surgery.  Her DVT/PE was diagnosed in May/30 03/2020 and thought to be provoked from immobility due to right knee pain.  She has history of GI bleeding even without anticoagulation. Reports having a colonoscopy at least 5 years ago with polypectomy of "cancerous polyps" patient is concerned about cancer since brother had colon cancer and sister has breast cancer.  She was seen in the ED on 9/3 with rectal bleeding for 3 days but due to stable hemoglobin she was discharged with follow-up with GI.  However appointments not until October.  ED Course: She was afebrile, normotensive with hemoglobin of 11.3 from a prior of 12.2.  CMP unremarkable.  Lipase is normal.  CT angio of the abdomen showed no active GI bleed with moderate increased stool burden.  ED physician has consulted GI who will see in  evaluation tomorrow.  Hospitalist then called for admission.  Review of Systems: Constitutional: No Weight Change, No Fever ENT/Mouth: No sore throat, No Rhinorrhea Eyes: No Eye Pain, No Vision Changes Cardiovascular: + occasional Chest Pain, +occasional SOB Respiratory: No Cough, No Sputum Gastrointestinal: No Nausea, No Vomiting, No Diarrhea, No Constipation, No Pain Genitourinary: no Urinary Incontinence, No Urgency, No Flank Pain Musculoskeletal: No Arthralgias, No Myalgias Skin: No Skin Lesions, No Pruritus, Neuro: no Weakness, No Numbness Psych: No Anxiety/Panic, No Depression, + decrease appetite Heme/Lymph: No Bruising, No Bleeding  Past Medical History:  Diagnosis Date   Arthritis    Back   Depression    no medication   Dyspnea    with exertion     Past Surgical History:  Procedure Laterality Date   BACK SURGERY  2016   Laminectomy   BACK SURGERY  1991   Disectomy   BREAST SURGERY     Breast lift   COLONOSCOPY  10/14/2016   Dr Orlena Sheldon. Benign neoplasm of ascending colon. Anal canal bleeding   COLONOSCOPY W/ POLYPECTOMY     x 2   ent surgery     ESOPHAGOGASTRODUODENOSCOPY  10/14/2016   Dr Orlena Sheldon. Normal EGD to second portion of duodenum   Pain stimulator trail     SPINAL CORD STIMULATOR INSERTION N/A 11/24/2016   Procedure: LUMBAR SPINAL CORD STIMULATOR INSERTION;  Surgeon: Melina Schools, MD;  Location: [facility];  Service: Orthopedics;  Laterality: N/A;  2.5 hrs   SPINAL CORD STIMULATOR REMOVAL N/A 06/28/2017   Procedure:  Spinal cord stimulator battery removal;  Surgeon: Melina Schools, MD;  Location: [facility];  Service: Orthopedics;  Laterality: N/A;   TONSILLECTOMY     age 2   TUBAL LIGATION       reports that she has never smoked. She has never used smokeless tobacco. She reports that she does not drink alcohol and does not use drugs. Social History  Allergies  Allergen Reactions   Gabapentin Shortness Of Breath and Rash   Morphine Anaphylaxis, Hives,  Shortness Of Breath and Rash   Tramadol Anaphylaxis, Swelling and Rash    headache   Hydrocodone-Ibuprofen Hives and Swelling    History reviewed. No pertinent family history.   Prior to Admission medications   Medication Sig Start Date End Date Taking? Authorizing Provider  Artificial Tear Solution (SYSTANE CONTACTS OP) Place 1 drop into both eyes daily as needed (dry eyes).   Yes [provider]  ELIQUIS 5 MG TABS tablet Take 5 mg by mouth 2 (two) times daily. 09/29/20  Yes [provider]  escitalopram (LEXAPRO) 10 MG tablet Take 15 mg by mouth daily. 06/30/20  Yes [provider]  PERCOCET 5-325 MG tablet Take 1 tablet by mouth every 8 (eight) hours as needed. 11/23/20  Yes [provider]  traZODone (DESYREL) 50 MG tablet Take 50-100 mg by mouth at bedtime. 07/03/20  Yes [provider]  apixaban (ELIQUIS) 5 MG TABS tablet Take 2 tablets (10 mg total) by mouth 2 (two) times daily for 6 days, THEN 1 tablet (5 mg total) 2 (two) times daily for 24 days. 08/09/20 09/08/20  Ezekiel Slocumb, DO  cyclobenzaprine (FLEXERIL) 10 MG tablet Take 1 tablet (10 mg total) by mouth 2 (two) times daily as needed for muscle spasms. Patient not taking: Reported on 11/25/2020 09/01/20   Gareth Morgan, MD  diclofenac Sodium (VOLTAREN) 1 % GEL Apply 2 g topically 4 (four) times daily as needed (right knee pain).    [provider]  hydrocortisone cream 1 % Apply topically 3 (three) times daily. Patient not taking: Reported on 11/25/2020 08/09/20   Nicole Kindred A, DO  magnesium oxide (MAG-OX) 400 (240 Mg) MG tablet Take 1 tablet (400 mg total) by mouth 2 (two) times daily as needed (leg cramp/spasms). 08/09/20   Ezekiel Slocumb, DO  naloxone [facility]) nasal spray 4 mg/0.1 mL Place 4 mg into the nose as needed (overdose). 04/04/19   [provider]  oxycodone (OXY-IR) 5 MG capsule Take 1 capsule (5 mg total) by mouth every 12 (twelve) hours as  needed. Patient not taking: Reported on 11/25/2020 09/03/20   Donella Stade, PA-C    Physical Exam: Vitals:   11/25/20 1525 11/25/20 1922 11/25/20 2132 11/25/20 2232  BP:  122/80 120/76 121/71  Pulse:  65 61 60  Resp:  18 18 18   Temp:      SpO2:  100% 94% 95%  Weight: 79 kg     Height: 5' 6"  (1.676 m)       Constitutional: NAD, calm, comfortable, elderly female laying at approximately 30 degree incline in bed Vitals:   11/25/20 1525 11/25/20 1922 11/25/20 2132 11/25/20 2232  BP:  122/80 120/76 121/71  Pulse:  65 61 60  Resp:  18 18 18   Temp:      SpO2:  100% 94% 95%  Weight: 79 kg     Height: 5' 6"  (1.676 m)      Eyes: PERRL, lids and conjunctivae normal ENMT: Mucous membranes  are moist.  Neck: normal, supple Respiratory: clear to auscultation bilaterally, no wheezing, no crackles. Normal respiratory effort. No accessory muscle use.  Cardiovascular: Regular rate and rhythm, no murmurs / rubs / gallops. No extremity edema.  Abdomen: Mild diffuse abdominal pain without any guarding, rebound tenderness or rigidity, bowel sounds positive.  Musculoskeletal: no clubbing / cyanosis. No joint deformity upper and lower extremities. Good ROM, no contractures. Normal muscle tone.  Clean  bandage on right anterior knee without erythema or edema Skin: no rashes, lesions, ulcers. No induration Neurologic: CN 2-12 grossly intact. Sensation intact. Strength 5/5 in all 4.  Psychiatric: Normal judgment and insight. Alert and oriented x 3. Normal mood.     Labs on Admission: I have personally reviewed following labs and imaging studies  CBC: Recent Labs  Lab 11/25/20 1538  WBC 7.1  NEUTROABS 4.8  HGB 11.3*  HCT 35.5*  MCV 84.3  PLT 494   Basic Metabolic Panel: Recent Labs  Lab 11/25/20 1538  NA 136  K 3.9  CL 104  CO2 25  GLUCOSE 109*  BUN 11  CREATININE 0.81  CALCIUM 9.4   GFR: Estimated Creatinine Clearance: 78.4 mL/min (by C-G formula based on SCr of 0.81  mg/dL). Liver Function Tests: Recent Labs  Lab 11/25/20 1538  AST 21  ALT 14  ALKPHOS 53  BILITOT 0.5  PROT 7.6  ALBUMIN 4.1   Recent Labs  Lab 11/25/20 1538  LIPASE 23   No results for input(s): AMMONIA in the last 168 hours. Coagulation Profile: No results for input(s): INR, PROTIME in the last 168 hours. Cardiac Enzymes: No results for input(s): CKTOTAL, CKMB, CKMBINDEX, TROPONINI in the last 168 hours. BNP (last 3 results) No results for input(s): PROBNP in the last 8760 hours. HbA1C: No results for input(s): HGBA1C in the last 72 hours. CBG: No results for input(s): GLUCAP in the last 168 hours. Lipid Profile: No results for input(s): CHOL, HDL, LDLCALC, TRIG, CHOLHDL, LDLDIRECT in the last 72 hours. Thyroid Function Tests: No results for input(s): TSH, T4TOTAL, FREET4, T3FREE, THYROIDAB in the last 72 hours. Anemia Panel: No results for input(s): VITAMINB12, FOLATE, FERRITIN, TIBC, IRON, RETICCTPCT in the last 72 hours. Urine analysis:    Component Value Date/Time   COLORURINE YELLOW 10/11/2020 1540   APPEARANCEUR CLEAR 10/11/2020 1540   LABSPEC >1.046 (H) 10/11/2020 1540   PHURINE 5.0 10/11/2020 1540   GLUCOSEU NEGATIVE 10/11/2020 1540   HGBUR NEGATIVE 10/11/2020 1540   BILIRUBINUR NEGATIVE 10/11/2020 [place] 10/11/2020 1540   PROTEINUR NEGATIVE 10/11/2020 1540   NITRITE NEGATIVE 10/11/2020 1540   LEUKOCYTESUR NEGATIVE 10/11/2020 1540    Radiological Exams on Admission: CT Angio Abd/Pel W and/or Wo Contrast  Result Date: 11/25/2020 CLINICAL DATA:  Bright red blood per rectum.  GI bleed. EXAM: CTA ABDOMEN AND PELVIS WITHOUT AND WITH CONTRAST TECHNIQUE: Multidetector CT imaging of the abdomen and pelvis was performed using the standard protocol during bolus administration of intravenous contrast. Multiplanar reconstructed images and MIPs were obtained and reviewed to evaluate the vascular anatomy. CONTRAST:  163m OMNIPAQUE IOHEXOL 350 MG/ML  SOLN COMPARISON:  CT dated 07/22/2020. FINDINGS: VASCULAR Aorta: Normal caliber aorta without aneurysm, dissection, vasculitis or significant stenosis. Celiac: Patent without evidence of aneurysm, dissection, vasculitis or significant stenosis. SMA: Patent without evidence of aneurysm, dissection, vasculitis or significant stenosis. Renals: Both renal arteries are patent without evidence of aneurysm, dissection, vasculitis, fibromuscular dysplasia or significant stenosis. IMA: Patent without evidence of aneurysm, dissection, vasculitis or  significant stenosis. Inflow: Patent without evidence of aneurysm, dissection, vasculitis or significant stenosis. Proximal Outflow: Bilateral common femoral and visualized portions of the superficial and profunda femoral arteries are patent without evidence of aneurysm, dissection, vasculitis or significant stenosis. Veins: The IVC is unremarkable.  No portal venous gas. Review of the MIP images confirms the above findings. NON-VASCULAR Lower chest: Minimal bibasilar linear atelectasis/scarring. The visualized lung bases are otherwise clear. No intra-abdominal free air or free fluid. Hepatobiliary: The liver is unremarkable. No intrahepatic biliary dilatation. Layering sludge within the gallbladder. No pericholecystic fluid. Pancreas: Unremarkable. No pancreatic ductal dilatation or surrounding inflammatory changes. Spleen: Normal in size without focal abnormality. Adrenals/Urinary Tract: The adrenal glands are unremarkable. The kidneys, visualized ureters, and urinary bladder appear unremarkable. Stomach/Bowel: There is moderate stool throughout the colon. There is no bowel obstruction or active inflammation. No extravasated contrast or evidence of active GI bleed. The appendix is normal. Lymphatic: No adenopathy. Reproductive: The uterus and ovaries are grossly unremarkable. Other: None Musculoskeletal: No acute or significant osseous findings. IMPRESSION: 1. No acute  intra-abdominal or pelvic pathology. No evidence of active GI bleed. 2. Moderate colonic stool burden. No bowel obstruction. Normal appendix. 3. Gallbladder sludge. Electronically Signed   By: Anner Crete M.D.   On: 11/25/2020 22:39      Assessment/Plan  Rectal bleeding -has hx of GI bleed in May at [facility] thought to be due to proctocolitis and not on anticoagulation  -Had bleed on 9/3 while on Eliquis for 3 days with stable Hgb -Now with 7 days even with Eliquis held for recent right knee procedure. Last dose morning of 9/21 -GI will see in consultation tomorrow. Keep NPO.  Hx of PE/DVT -dx on 5/31 and thought to be provoked due to immobility from right knee pain -could consider repeat CTA chest to assess whether Eliquis can be discontinued given recurrent GI bleed   Borderline blood pressure - Patient had drops in blood pressure to systolic of 90 following IV Dilaudid - Continue to monitor closely  Constipation -likely will get bowel prep pending GI recommendations tomorrow  Recent Right knee surgery -had reported meniscal tear repair on 9/16 with [facility] orthopedic. Unable to see full encounter in Epic  DVT prophylaxis:.SCD Code Status: Full Family Communication: Plan discussed with patient at bedside  disposition Plan: Home with observation Consults called: GI Admission status: Observation   Level of care: Med-Surg  Status is: Observation  The patient remains OBS appropriate and will d/c before 2 midnights.  Dispo: The patient is from: Home              Anticipated d/c is to: Home              Patient currently is not medically stable to d/c.   Difficult to place patient No         Orene Desanctis DO [facility]   If 7PM-7AM, please contact night-coverage [email or web address]   11/26/2020, 12:24 AM

## 2020-11-26 NOTE — Consult Note (Signed)
Referring Provider:  [facility]         Primary Care Physician:  [name], MD Primary Gastroenterologist:   Previously [name]. Has appointment in October with [place] to establish care with Dr. [name]  Reason for Consultation:   Rectal bleeding                ASSESSMENT / PLAN   # 59 year old female with intermittent lower abdominal pain / rectal bleeding for 2 years.  CTA abd / pelvis in May 2022 suggested sigmoid colitis / proctitis which was treated with antibiotics.  Now with progressive bleeding as well as worsening lower abdominal pain. Concurrently her hgb has drifted 2-3 grams in last few days. On Eliquis since May for DVT  but only one dose in last several days due to recent knee surgery ( last dose 2 days ago). CTA abd / pelvis this admission negative for active GI bleed. No acute abnormalities, specifically no evidence for colitis as was seen on imaging in May 2022. Etiology of chronic bleeding unknown but it is clearly progressive. Abdominal pain and worsening back pain may be unrelated to the bleeding. Rule out colon polyp, neoplasm, AVM. Diverticular bleed seems less likely given chronicity but certainly still a consideration.  --Patient needs a colonoscopy which can possibly be done in am. The risks and benefits of colonoscopy with possible polypectomy / biopsies were discussed and the patient agrees to proceed.   # RLE DVT post knee surgery. Eliquis on hold    HISTORY OF PRESENT ILLNESS                                                                                                                         Chief Complaint: Rectal bleeding  Brittney Carter is a 60 y.o. female with a past medical history significant for depression, RLE DVT, several orthopedic surgeries ( back and knee). See [facility] for any additional medical history.    Patient gives a 2-year history of intermittent rectal bleeding and lower abdominal pain.  She has been seen in the ED ( locally and in  [facility] ) for these symptoms with findings of colitis on CT scan in May 2022.  She saw [name] (GI in [place]) in May 2022 for evaluation of symptoms.   For unclear reasons she did not have a colonoscopy. Patient says that he told her that he could do a colonoscopy but then she would need to see someone else for ongoing care ??  Patient therefore made an appointment to see Dr. [name] in our office .  Apparently that appointment was rescheduled, she is now scheduled to see him in October .  Her last colonoscopy was with [name] ~ 5 years ago. Meanwhile she has continued to have intermittent rectal bleeding.   Patient diagnosed with a RLE DVT in May 2022 after presenting to hospital with knee pain. She was ~ 1 month out from knee surgery at the time. She  was started on Eliquis.    11/07/20 - presented to ED with recurrent rectal bleeding. Hgb was stable. EDP discussed case with [facility] who felt outpatient follow up was appropriate.     ED course:  Presented to ED again yesterday with recurrent rectal bleeding, lower abdominal pain.  She had been off Eliquis for a few days because of another recent knee surgery but restarted it the day prior to coming to ED. Patient says the rectal bleeding has been much heavier than previous times and she has been passing clots.  For the most part the rectal bleeding has not been associated with diarrhea but rather formed stools.  However, she has been having loose stool over the last day or two as bleeding has increased.  The burning in her lower abdomen has increased in intensity . Also having significant low back pain. She has chronic back pain but this lower back pain is different and much worse than her usual pain.  In the ED patient was hemodynamically stable.  Her hemoglobin was 11.3 down from 12.2 earlier this month. Labs otherwise okay. CTA abd / pelvis negative for GI bleed or other acute abnormalities  Since admission hgb has declined further to 9.7.     Patient says that other than orthopedic issues she is in good health.  Her brother had colon cancer in his 28s and so she is nervous about this rectal bleeding.   SIGNIFICANT DIAGNOTIC STUDIES    07/22/20 CTA abd / pelvis for GI bleeding IMPRESSION: 1. Improved but persistent wall thickening of the sigmoid colon and rectum, suggestive of infectious or inflammatory colitis/proctitis. No pneumatosis or signs of perforation. 2. Mesenteric vasculature is patent. No evidence of active gastrointestinal bleed. If continued clinical concern for active gastrointestinal bleed consider further evaluation with nuclear medicine tagged red blood cell scan. 3. Colonic diverticulosis without findings of acute diverticulitis   PREVIOUS ENDOSCOPIC EVALUATIONS    Last colonoscopy approximately 5 years ago with [name].  Apparently several polyps were removed.  Will obtain copy of report if possible  Past Medical History:  Diagnosis Date   Arthritis    Back   Depression    no medication   Dyspnea    with exertion     Past Surgical History:  Procedure Laterality Date   BACK SURGERY  2016   Laminectomy   BACK SURGERY  1991   Disectomy   BREAST SURGERY     Breast lift   COLONOSCOPY  10/14/2016   Dr Orlena Sheldon. Benign neoplasm of ascending colon. Anal canal bleeding   COLONOSCOPY W/ POLYPECTOMY     x 2   ent surgery     ESOPHAGOGASTRODUODENOSCOPY  10/14/2016   Dr Orlena Sheldon. Normal EGD to second portion of duodenum   Pain stimulator trail     SPINAL CORD STIMULATOR INSERTION N/A 11/24/2016   Procedure: LUMBAR SPINAL CORD STIMULATOR INSERTION;  Surgeon: Melina Schools, MD;  Location: [place];  Service: Orthopedics;  Laterality: N/A;  2.5 hrs   SPINAL CORD STIMULATOR REMOVAL N/A 06/28/2017   Procedure: Spinal cord stimulator battery removal;  Surgeon: Melina Schools, MD;  Location: [place];  Service: Orthopedics;  Laterality: N/A;   TONSILLECTOMY     age 44   TUBAL LIGATION      Prior to  Admission medications   Medication Sig Start Date End Date Taking? Authorizing Provider  Artificial Tear Solution (SYSTANE CONTACTS OP) Place 1 drop into both eyes daily as needed (dry eyes).  Yes [provider]  ELIQUIS 5 MG TABS tablet Take 5 mg by mouth 2 (two) times daily. 09/29/20  Yes [provider]  escitalopram (LEXAPRO) 10 MG tablet Take 15 mg by mouth daily. 06/30/20  Yes [provider]  PERCOCET 5-325 MG tablet Take 1 tablet by mouth every 8 (eight) hours as needed. 11/23/20  Yes [provider]  traZODone (DESYREL) 50 MG tablet Take 50-100 mg by mouth at bedtime. 07/03/20  Yes [provider]  apixaban (ELIQUIS) 5 MG TABS tablet Take 2 tablets (10 mg total) by mouth 2 (two) times daily for 6 days, THEN 1 tablet (5 mg total) 2 (two) times daily for 24 days. 08/09/20 09/08/20  Ezekiel Slocumb, DO  cyclobenzaprine (FLEXERIL) 10 MG tablet Take 1 tablet (10 mg total) by mouth 2 (two) times daily as needed for muscle spasms. Patient not taking: Reported on 11/25/2020 09/01/20   Gareth Morgan, MD  diclofenac Sodium (VOLTAREN) 1 % GEL Apply 2 g topically 4 (four) times daily as needed (right knee pain).    [provider]  hydrocortisone cream 1 % Apply topically 3 (three) times daily. Patient not taking: Reported on 11/25/2020 08/09/20   Nicole Kindred A, DO  magnesium oxide (MAG-OX) 400 (240 Mg) MG tablet Take 1 tablet (400 mg total) by mouth 2 (two) times daily as needed (leg cramp/spasms). 08/09/20   Ezekiel Slocumb, DO  naloxone [facility]) nasal spray 4 mg/0.1 mL Place 4 mg into the nose as needed (overdose). 04/04/19   [provider]  oxycodone (OXY-IR) 5 MG capsule Take 1 capsule (5 mg total) by mouth every 12 (twelve) hours as needed. Patient not taking: Reported on 11/25/2020 09/03/20   Donella Stade, PA-C    Current Facility-Administered Medications  Medication Dose Route Frequency Provider Last Rate Last Admin   0.9 %   sodium chloride infusion   Intravenous Continuous Kathryne Eriksson, NP 50 mL/hr at 11/26/20 0231 New Bag at 11/26/20 0231   acetaminophen (TYLENOL) tablet 650 mg  650 mg Oral Q6H PRN Tu, Ching T, DO       cyclobenzaprine (FLEXERIL) tablet 10 mg  10 mg Oral BID PRN Modena Jansky, MD       escitalopram (LEXAPRO) tablet 15 mg  15 mg Oral Daily Tu, Ching T, DO   15 mg at 11/26/20 4332   HYDROmorphone (DILAUDID) injection 0.5 mg  0.5 mg Intravenous Q4H PRN Vernell Leep D, MD   0.5 mg at 11/26/20 1028   oxyCODONE-acetaminophen (PERCOCET/ROXICET) 5-325 MG per tablet 1 tablet  1 tablet Oral Q8H PRN Modena Jansky, MD   1 tablet at 11/26/20 9518   polyvinyl alcohol (LIQUIFILM TEARS) 1.4 % ophthalmic solution 1 drop  1 drop Both Eyes PRN Modena Jansky, MD       traZODone (DESYREL) tablet 50-100 mg  50-100 mg Oral QHS Tu, Ching T, DO   100 mg at 11/26/20 0232    Allergies as of 11/25/2020 - Review Complete 11/25/2020  Allergen Reaction Noted   Gabapentin Shortness Of Breath and Rash 10/11/2020   Morphine Anaphylaxis, Hives, Shortness Of Breath, and Rash 08/12/2014   Tramadol Anaphylaxis, Swelling, and Rash 03/25/2014   Hydrocodone-ibuprofen Hives and Swelling 11/25/2020    History reviewed. No pertinent family history.  Social History   Socioeconomic History   Marital status: Divorced    Spouse name: Not on file   Number of children: Not on file   Years of education: Not  on file   Highest education level: Not on file  Occupational History   Not on file  Tobacco Use   Smoking status: Never   Smokeless tobacco: Never  Vaping Use   Vaping Use: Never used  Substance and Sexual Activity   Alcohol use: No   Drug use: No   Sexual activity: Not on file  Other Topics Concern   Not on file  Social History Narrative   Not on file   Social Determinants of Health   Financial Resource Strain: Not on file  Food Insecurity: No Food Insecurity   Worried About Running Out of Food in  the Last Year: Never true   Ran Out of Food in the Last Year: Never true  Transportation Needs: No Transportation Needs   Lack of Transportation (Medical): No   Lack of Transportation (Non-Medical): No  Physical Activity: Not on file  Stress: Not on file  Social Connections: Not on file  Intimate Partner Violence: Not on file    Review of Systems: Positive for occasional chest pain and shortness of breath . All systems reviewed and negative except where noted in HPI.   OBJECTIVE    Physical Exam: Vital signs in last 24 hours: Temp:  [97.8 F (36.6 C)-98 F (36.7 C)] 97.8 F (36.6 C) (09/22 0520) Pulse Rate:  [52-90] 52 (09/22 0520) Resp:  [18] 18 (09/22 0520) BP: (98-125)/(62-88) 98/65 (09/22 0520) SpO2:  [89 %-100 %] 89 % (09/22 0520) Weight:  [79 kg] 79 kg (09/21 1525)   General:   Alert  female in NAD Psych:  Pleasant, cooperative. Normal mood and affect. Eyes:  Pupils equal, sclera clear, no icterus.   Conjunctiva pink. Ears:  Normal auditory acuity. Nose:  No deformity, discharge,  or lesions. Neck:  Supple; no masses Lungs:  Clear throughout to auscultation.   No wheezes, crackles, or rhonchi.  Heart:  Regular rate and rhythm;  no lower extremity edema Abdomen:  Soft, non-distended, mild generalized lower abdominal tenderness. BS active, no palp mass   Rectal:  Deferred  Msk:  Symmetrical without gross deformities. . Neurologic:  Alert and  oriented x4;  grossly normal neurologically. Skin:  Intact without significant lesions or rashes.   Scheduled inpatient medications  escitalopram  15 mg Oral Daily   traZODone  50-100 mg Oral QHS      Intake/Output from previous day: No intake/output data recorded. Intake/Output this shift: No intake/output data recorded.   Lab Results: Recent Labs    11/25/20 1538 11/26/20 0459 11/26/20 1033  WBC 7.1 6.0 6.0  HGB 11.3* 9.4* 9.7*  HCT 35.5* 30.3* 31.0*  PLT 339 266 291   BMET Recent Labs     11/25/20 1538  NA 136  K 3.9  CL 104  CO2 25  GLUCOSE 109*  BUN 11  CREATININE 0.81  CALCIUM 9.4   LFT Recent Labs    11/25/20 1538  PROT 7.6  ALBUMIN 4.1  AST 21  ALT 14  ALKPHOS 53  BILITOT 0.5   PT/INR No results for input(s): LABPROT, INR in the last 72 hours. Hepatitis Panel No results for input(s): HEPBSAG, HCVAB, HEPAIGM, HEPBIGM in the last 72 hours.   . CBC Latest Ref Rng & Units 11/26/2020 11/26/2020 11/25/2020  WBC 4.0 - 10.5 K/uL 6.0 6.0 7.1  Hemoglobin 12.0 - 15.0 g/dL 9.7(L) 9.4(L) 11.3(L)  Hematocrit 36.0 - 46.0 % 31.0(L) 30.3(L) 35.5(L)  Platelets 150 - 400 K/uL 291 266 339    . CMP  Latest Ref Rng & Units 11/25/2020 11/07/2020 10/11/2020  Glucose 70 - 99 mg/dL 109(H) 116(H) 106(H)  BUN 6 - 20 mg/dL 11 13 20   Creatinine 0.44 - 1.00 mg/dL 0.81 0.77 0.80  Sodium 135 - 145 mmol/L 136 142 142  Potassium 3.5 - 5.1 mmol/L 3.9 3.8 4.0  Chloride 98 - 111 mmol/L 104 110 109  CO2 22 - 32 mmol/L 25 25 -  Calcium 8.9 - 10.3 mg/dL 9.4 10.0 -  Total Protein 6.5 - 8.1 g/dL 7.6 7.4 -  Total Bilirubin 0.3 - 1.2 mg/dL 0.5 0.6 -  Alkaline Phos 38 - 126 U/L 53 56 -  AST 15 - 41 U/L 21 17 -  ALT 0 - 44 U/L 14 15 -   Studies/Results: CT Angio Abd/Pel W and/or Wo Contrast  Result Date: 11/25/2020 CLINICAL DATA:  Bright red blood per rectum.  GI bleed. EXAM: CTA ABDOMEN AND PELVIS WITHOUT AND WITH CONTRAST TECHNIQUE: Multidetector CT imaging of the abdomen and pelvis was performed using the standard protocol during bolus administration of intravenous contrast. Multiplanar reconstructed images and MIPs were obtained and reviewed to evaluate the vascular anatomy. CONTRAST:  160m OMNIPAQUE IOHEXOL 350 MG/ML SOLN COMPARISON:  CT dated 07/22/2020. FINDINGS: VASCULAR Aorta: Normal caliber aorta without aneurysm, dissection, vasculitis or significant stenosis. Celiac: Patent without evidence of aneurysm, dissection, vasculitis or significant stenosis. SMA: Patent without evidence of  aneurysm, dissection, vasculitis or significant stenosis. Renals: Both renal arteries are patent without evidence of aneurysm, dissection, vasculitis, fibromuscular dysplasia or significant stenosis. IMA: Patent without evidence of aneurysm, dissection, vasculitis or significant stenosis. Inflow: Patent without evidence of aneurysm, dissection, vasculitis or significant stenosis. Proximal Outflow: Bilateral common femoral and visualized portions of the superficial and profunda femoral arteries are patent without evidence of aneurysm, dissection, vasculitis or significant stenosis. Veins: The IVC is unremarkable.  No portal venous gas. Review of the MIP images confirms the above findings. NON-VASCULAR Lower chest: Minimal bibasilar linear atelectasis/scarring. The visualized lung bases are otherwise clear. No intra-abdominal free air or free fluid. Hepatobiliary: The liver is unremarkable. No intrahepatic biliary dilatation. Layering sludge within the gallbladder. No pericholecystic fluid. Pancreas: Unremarkable. No pancreatic ductal dilatation or surrounding inflammatory changes. Spleen: Normal in size without focal abnormality. Adrenals/Urinary Tract: The adrenal glands are unremarkable. The kidneys, visualized ureters, and urinary bladder appear unremarkable. Stomach/Bowel: There is moderate stool throughout the colon. There is no bowel obstruction or active inflammation. No extravasated contrast or evidence of active GI bleed. The appendix is normal. Lymphatic: No adenopathy. Reproductive: The uterus and ovaries are grossly unremarkable. Other: None Musculoskeletal: No acute or significant osseous findings. IMPRESSION: 1. No acute intra-abdominal or pelvic pathology. No evidence of active GI bleed. 2. Moderate colonic stool burden. No bowel obstruction. Normal appendix. 3. Gallbladder sludge. Electronically Signed   By: AAnner CreteM.D.   On: 11/25/2020 22:39    Principal Problem:   Rectal  bleeding Active Problems:   History of pulmonary embolus (PE)   Constipation   Hypotension   H/O right knee surgery    PTye Savoy NP-C @  11/26/2020, 11:27 AM

## 2020-11-26 NOTE — Progress Notes (Signed)
With [employer] [facility] PROGRESS NOTE   Brittney Carter  YQM:[id number]    DOB: 1960-10-07    DOA: 11/25/2020  PCP: [name], MD   I have briefly reviewed patients previous medical records in [facility].  Chief Complaint  Patient presents with   Rectal Bleeding    Brief Narrative:  60 year old female with medical history significant for GI bleed, PE/DVT on Eliquis, chronic back pain and depression presented to the ED approximately 1 week history of bright red rectal bleeding with clots, varying volume, associated with burning lower abdominal pain, worsening chronic low back pain, decreased appetite and chills.  She briefly held her Eliquis from 9/16, had right knee surgery with [name] on 9/19 (did not mention to him regarding her ongoing rectal bleeding) and resumed Eliquis on 9/20.  She was seen in the ED on 9/3 for rectal bleeding x3 days, discharged home for outpatient follow-up.  Admitted for acute lower GI bleeding.  [facility] consulted.   Assessment & Plan:  Principal Problem:   Rectal bleeding Active Problems:   History of pulmonary embolus (PE)   Constipation   Hypotension   H/O right knee surgery   Hematochezia   Family history of colon cancer   Lower abdominal pain   Acute lower GI bleeding: Unclear etiology.  Reports history of colonoscopy with [name] about 5 years ago and states that 5 "cancerous" polyps were removed.  Had an upcoming appointment to see Dr. [name] with [facility] in October.  Hemodynamically stable.  CT abdomen and pelvis without acute findings or bleeding source.  Eliquis on hold.  GI consultation appreciated and plan colonoscopy either 9/23 or 9/24.  Continue to monitor CBCs closely.  Acute blood loss anemia: Hemoglobin is dropped from 12.2 on 9/3-9.7 today.  Likely secondary to GI bleeding.  Follow CBC daily and transfuse if hemoglobin 7 g or less.  Suprapubic burning pain: Unclear etiology.  CTA abdomen and pelvis without acute findings.   Does have significant stool burden which will be addressed during bowel prep for colonoscopy.  Urine microscopy not indicative of UTI.  Supportive care.  Acute on chronic low back pain: Supportive treatment for now.  No focal neurological deficits on exam.  Constipation: Will be addressed with bowel prep now but after that we will need to be on a bowel regimen.  History of PE/DVT: Diagnosed 5/31, thought to be provoked due to immobility from right knee pain.  May have completed the required duration of anticoagulation and will need to be discussed with her PCP.  Borderline/soft blood pressures: Continue to monitor closely.  S/p recent right knee surgery: Reportedly for meniscal tear repair on 9/16 with [name] with [facility] orthopedics.  Appears stable.    Body mass index is 28.11 kg/m.    DVT prophylaxis: SCDs Start: 11/26/20 0020     Code Status: Full Code Family Communication: None at bedside. Disposition:  Status is: Observation  The patient will require care spanning > 2 midnights and should be moved to inpatient because: Ongoing diagnostic testing needed not appropriate for outpatient work up  Dispo: The patient is from: Home              Anticipated d/c is to: Home              Patient currently is not medically stable to d/c.   Difficult to place patient No        Consultants:   [facility]  Procedures:  None  Antimicrobials:    Anti-infectives (From admission, onward)    None         Subjective:  Seen this morning.  No further BM since hospital admission since yesterday.  Reports suprapubic burning pain and lower back burning pain.  Denies dysuria.  Witnessed multiple pictures that patient has taken on her smart phone from 9/13 which shows bright red bleeding with clots.  Objective:   Vitals:   11/25/20 2232 11/26/20 0130 11/26/20 0200 11/26/20 0520  BP: 121/71 98/62 122/73 98/65  Pulse: 60 (!) 57 60 (!) 52  Resp: 18 18 18 18   Temp:   98 F  (36.7 C) 97.8 F (36.6 C)  TempSrc:   Oral Oral  SpO2: 95% 94% 100% (!) 89%  Weight:      Height:        General exam: Middle-age female, moderately built and overweight lying uncomfortably propped up in bed without distress. Respiratory system: Clear to auscultation. Respiratory effort normal. Cardiovascular system: S1 & S2 heard, RRR. No JVD, murmurs, rubs, gallops or clicks. No pedal edema. Gastrointestinal system: Abdomen is nondistended, soft and mild fluctuating/variable areas of tenderness without peritoneal signs.  No organomegaly or masses felt. Normal bowel sounds heard. Central nervous system: Alert and oriented. No focal neurological deficits. Extremities: Symmetric 5 x 5 power. Skin: No rashes, lesions or ulcers Psychiatry: Judgement and insight appear normal. Mood & affect appropriate.     Data Reviewed:   I have personally reviewed following labs and imaging studies   CBC: Recent Labs  Lab 11/25/20 1538 11/26/20 0459 11/26/20 1033  WBC 7.1 6.0 6.0  NEUTROABS 4.8  --   --   HGB 11.3* 9.4* 9.7*  HCT 35.5* 30.3* 31.0*  MCV 84.3 85.1 84.9  PLT 339 266 097    Basic Metabolic Panel: Recent Labs  Lab 11/25/20 1538  NA 136  K 3.9  CL 104  CO2 25  GLUCOSE 109*  BUN 11  CREATININE 0.81  CALCIUM 9.4    Liver Function Tests: Recent Labs  Lab 11/25/20 1538  AST 21  ALT 14  ALKPHOS 53  BILITOT 0.5  PROT 7.6  ALBUMIN 4.1    CBG: No results for input(s): GLUCAP in the last 168 hours.  Microbiology Studies:   Recent Results (from the past 240 hour(s))  Resp Panel by RT-PCR (Flu A&B, Covid) Nasopharyngeal Swab     Status: None   Collection Time: 11/26/20 12:31 AM   Specimen: Nasopharyngeal Swab; Nasopharyngeal(NP) swabs in vial transport medium  Result Value Ref Range Status   SARS Coronavirus 2 by RT PCR NEGATIVE NEGATIVE Final    Comment: (NOTE) SARS-CoV-2 target nucleic acids are NOT DETECTED.  The SARS-CoV-2 RNA is generally detectable in  upper respiratory specimens during the acute phase of infection. The lowest concentration of SARS-CoV-2 viral copies this assay can detect is 138 copies/mL. A negative result does not preclude SARS-Cov-2 infection and should not be used as the sole basis for treatment or other patient management decisions. A negative result may occur with  improper specimen collection/handling, submission of specimen other than nasopharyngeal swab, presence of viral mutation(s) within the areas targeted by this assay, and inadequate number of viral copies(<138 copies/mL). A negative result must be combined with clinical observations, patient history, and epidemiological information. The expected result is Negative.  Fact Sheet for Patients:  EntrepreneurPulse.com.au  Fact Sheet for Healthcare Providers:  IncredibleEmployment.be  This test is no t yet approved or cleared by the  [place] Architectural technologist and  has been authorized for detection and/or diagnosis of SARS-CoV-2 by FDA under an Print production planner (EUA). This EUA will remain  in effect (meaning this test can be used) for the duration of the COVID-19 declaration under Section 564(b)(1) of the Act, 21 U.S.C.section 360bbb-3(b)(1), unless the authorization is terminated  or revoked sooner.       Influenza A by PCR NEGATIVE NEGATIVE Final   Influenza B by PCR NEGATIVE NEGATIVE Final    Comment: (NOTE) The Xpert Xpress SARS-CoV-2/FLU/RSV plus assay is intended as an aid in the diagnosis of influenza from Nasopharyngeal swab specimens and should not be used as a sole basis for treatment. Nasal washings and aspirates are unacceptable for Xpert Xpress SARS-CoV-2/FLU/RSV testing.  Fact Sheet for Patients: EntrepreneurPulse.com.au  Fact Sheet for Healthcare Providers: IncredibleEmployment.be  This test is not yet approved or cleared by the [place] FDA and has been  authorized for detection and/or diagnosis of SARS-CoV-2 by FDA under an Emergency Use Authorization (EUA). This EUA will remain in effect (meaning this test can be used) for the duration of the COVID-19 declaration under Section 564(b)(1) of the Act, 21 U.S.C. section 360bbb-3(b)(1), unless the authorization is terminated or revoked.  Performed at [facility], [place] [address], [place], [place] [address]      Radiology Studies:  CT Angio Abd/Pel W and/or Wo Contrast  Result Date: 11/25/2020 CLINICAL DATA:  Bright red blood per rectum.  GI bleed. EXAM: CTA ABDOMEN AND PELVIS WITHOUT AND WITH CONTRAST TECHNIQUE: Multidetector CT imaging of the abdomen and pelvis was performed using the standard protocol during bolus administration of intravenous contrast. Multiplanar reconstructed images and MIPs were obtained and reviewed to evaluate the vascular anatomy. CONTRAST:  180m OMNIPAQUE IOHEXOL 350 MG/ML SOLN COMPARISON:  CT dated 07/22/2020. FINDINGS: VASCULAR Aorta: Normal caliber aorta without aneurysm, dissection, vasculitis or significant stenosis. Celiac: Patent without evidence of aneurysm, dissection, vasculitis or significant stenosis. SMA: Patent without evidence of aneurysm, dissection, vasculitis or significant stenosis. Renals: Both renal arteries are patent without evidence of aneurysm, dissection, vasculitis, fibromuscular dysplasia or significant stenosis. IMA: Patent without evidence of aneurysm, dissection, vasculitis or significant stenosis. Inflow: Patent without evidence of aneurysm, dissection, vasculitis or significant stenosis. Proximal Outflow: Bilateral common femoral and visualized portions of the superficial and profunda femoral arteries are patent without evidence of aneurysm, dissection, vasculitis or significant stenosis. Veins: The IVC is unremarkable.  No portal venous gas. Review of the MIP images confirms the above findings. NON-VASCULAR Lower chest:  Minimal bibasilar linear atelectasis/scarring. The visualized lung bases are otherwise clear. No intra-abdominal free air or free fluid. Hepatobiliary: The liver is unremarkable. No intrahepatic biliary dilatation. Layering sludge within the gallbladder. No pericholecystic fluid. Pancreas: Unremarkable. No pancreatic ductal dilatation or surrounding inflammatory changes. Spleen: Normal in size without focal abnormality. Adrenals/Urinary Tract: The adrenal glands are unremarkable. The kidneys, visualized ureters, and urinary bladder appear unremarkable. Stomach/Bowel: There is moderate stool throughout the colon. There is no bowel obstruction or active inflammation. No extravasated contrast or evidence of active GI bleed. The appendix is normal. Lymphatic: No adenopathy. Reproductive: The uterus and ovaries are grossly unremarkable. Other: None Musculoskeletal: No acute or significant osseous findings. IMPRESSION: 1. No acute intra-abdominal or pelvic pathology. No evidence of active GI bleed. 2. Moderate colonic stool burden. No bowel obstruction. Normal appendix. 3. Gallbladder sludge. Electronically Signed   By: AAnner CreteM.D.   On: 11/25/2020 22:39     Scheduled Meds:  dicyclomine  20 mg Oral TID AC   escitalopram  15 mg Oral Daily   traZODone  50-100 mg Oral QHS    Continuous Infusions:    sodium chloride 50 mL/hr at 11/26/20 0231     LOS: 0 days     Vernell Leep, MD, [place], [facility]. [facility]    To contact the attending provider between 7A-7P or the covering provider during after hours 7P-7A, please log into the web site [email or web address] and access using universal [place] password for that web site. If you do not have the password, please call the hospital operator.  11/26/2020, 3:28 PM

## 2020-11-26 NOTE — Progress Notes (Signed)
Endoscopy is full tomorrow, no availability for colonoscopy. I told the patient it would be Saturday. I ordered a regular diet for her.  Will start clear liquids tomorrow.

## 2020-11-27 LAB — CBC
HCT: 31.2 % — ABNORMAL LOW (ref 36.0–46.0)
Hemoglobin: 9.7 g/dL — ABNORMAL LOW (ref 12.0–15.0)
MCH: 26.8 pg (ref 26.0–34.0)
MCHC: 31.1 g/dL (ref 30.0–36.0)
MCV: 86.2 fL (ref 80.0–100.0)
Platelets: 273 10*3/uL (ref 150–400)
RBC: 3.62 MIL/uL — ABNORMAL LOW (ref 3.87–5.11)
RDW: 16.3 % — ABNORMAL HIGH (ref 11.5–15.5)
WBC: 5.9 10*3/uL (ref 4.0–10.5)
nRBC: 0 % (ref 0.0–0.2)

## 2020-11-27 MED ORDER — BISACODYL 5 MG PO TBEC
10.0000 mg | DELAYED_RELEASE_TABLET | Freq: Once | ORAL | Status: AC
  Administered 2020-11-27: 10 mg via ORAL
  Filled 2020-11-27: qty 2

## 2020-11-27 MED ORDER — PEG-KCL-NACL-NASULF-NA ASC-C 100 G PO SOLR: 1.0000 | Freq: Once | ORAL | Status: DC

## 2020-11-27 MED ORDER — PEG-KCL-NACL-NASULF-NA ASC-C 100 G PO SOLR
0.5000 | Freq: Once | ORAL | Status: AC
  Administered 2020-11-27: 100 g via ORAL
  Filled 2020-11-27: qty 1

## 2020-11-27 MED ORDER — PEG-KCL-NACL-NASULF-NA ASC-C 100 G PO SOLR
0.5000 | Freq: Once | ORAL | Status: AC
  Administered 2020-11-28: 100 g via ORAL
  Filled 2020-11-27: qty 1

## 2020-11-27 NOTE — Progress Notes (Signed)
Progress Note Hospital Day: 3  Chief Complaint:         ASSESSMENT AND PLAN   # 60 year old female with intermittent lower abdominal pain / rectal bleeding for 2 years.  Prior CT during admission for rectal bleeding suggested sigmoid colitis / proctitis in May 2022.   Now with progressive bleeding as well as worsening lower abdominal pain and declined in hgb. Home Eliquis is on hold. CTA abd / pelvis this admission negative for active GI bleed or any acute GI abnormalities ( or residual colitis). Abdominal pain and worsening back pain may be unrelated to the bleeding. Rule out colon polyp, neoplasm, AVM. Diverticular bleed seems less likely given chronicity but certainly still a consideration.  --Scheduled to have a colonoscopy in am.  The risks and benefits of colonoscopy with possible polypectomy / biopsies were discussed and the patient agrees to proceed.    # RLE DVT post knee surgery. Eliquis on hold      DIAGNOSTIC STUDIES THIS ADMISSION:  07/22/20 CTA abd / pelvis for GI bleeding IMPRESSION: 1. Improved but persistent wall thickening of the sigmoid colon and rectum, suggestive of infectious or inflammatory colitis/proctitis. No pneumatosis or signs of perforation. 2. Mesenteric vasculature is patent. No evidence of active gastrointestinal bleed. If continued clinical concern for active gastrointestinal bleed consider further evaluation with nuclear medicine tagged red blood cell scan. 3. Colonic diverticulosis without findings of acute diverticulitis    SUBJECTIVE   Feels okay. No BMs  or bleeding today.        OBJECTIVE      Scheduled inpatient medications:   bisacodyl  10 mg Oral Once   dicyclomine  20 mg Oral TID AC   escitalopram  15 mg Oral Daily   influenza vac split quadrivalent PF  0.5 mL Intramuscular Tomorrow-1000   traZODone  50-100 mg Oral QHS   Continuous inpatient infusions:  PRN inpatient medications: acetaminophen, cyclobenzaprine,  HYDROmorphone (DILAUDID) injection, oxyCODONE-acetaminophen, polyvinyl alcohol  Vital signs in last 24 hours: Temp:  [97.8 F (36.6 C)] 97.8 F (36.6 C) (09/23 0529) Pulse Rate:  [63] 63 (09/23 0529) Resp:  [16] 16 (09/23 0529) BP: (96)/(56) 96/56 (09/23 0529) SpO2:  [95 %] 95 % (09/23 0529)    Intake/Output Summary (Last 24 hours) at 11/27/2020 0910 Last data filed at 11/26/2020 1330 Gross per 24 hour  Intake 820 ml  Output --  Net 820 ml     Physical Exam:  General: Alert female in NAD Heart:  Regular rate and rhythm. No lower extremity edema Pulmonary: Normal respiratory effort Abdomen: Soft, nondistended, nontender. Normal bowel sounds.  Neurologic: Alert and oriented Psych: Pleasant. Cooperative.   Filed Weights   11/25/20 1525  Weight: 79 kg    Intake/Output from previous day: 09/22 0701 - 09/23 0700 In: 820 [P.O.:820] Out: -  Intake/Output this shift: No intake/output data recorded.    Lab Results: Recent Labs    11/26/20 0459 11/26/20 1033 11/27/20 0442  WBC 6.0 6.0 5.9  HGB 9.4* 9.7* 9.7*  HCT 30.3* 31.0* 31.2*  PLT 266 291 273   BMET Recent Labs    11/25/20 1538  NA 136  K 3.9  CL 104  CO2 25  GLUCOSE 109*  BUN 11  CREATININE 0.81  CALCIUM 9.4   LFT Recent Labs    11/25/20 1538  PROT 7.6  ALBUMIN 4.1  AST 21  ALT 14  ALKPHOS 53  BILITOT 0.5     Principal Problem:  Rectal bleeding Active Problems:   History of pulmonary embolus (PE)   Constipation   Hypotension   H/O right knee surgery   Hematochezia   Family history of colon cancer   Lower abdominal pain     LOS: 1 day   Tye Savoy ,NP 11/27/2020, 9:10 AM

## 2020-11-27 NOTE — Progress Notes (Signed)
The patient is injury-free, afebrile, alert, and oriented X 4. Vital signs were within the baseline during this shift. Pt's pain was controlled with current pain regiment. She denies chest pain, SOB, nausea, vomiting, dizziness, signs or symptoms of bleeding, or acute changes during this shift. We will continue to monitor and work toward achieving the care plan goals.

## 2020-11-27 NOTE — Plan of Care (Signed)

## 2020-11-27 NOTE — Progress Notes (Signed)
With [employer] [facility] PROGRESS NOTE   KAMYIAH COLANTONIO  MKL:[id number]    DOB: 06-Feb-1961    DOA: 11/25/2020  PCP: [name], MD   I have briefly reviewed patients previous medical records in [facility].  Chief Complaint  Patient presents with   Rectal Bleeding    Brief Narrative:  60 year old female with medical history significant for GI bleed, PE/DVT on Eliquis, chronic back pain and depression presented to the ED approximately 1 week history of bright red rectal bleeding with clots, varying volume, associated with burning lower abdominal pain, worsening chronic low back pain, decreased appetite and chills.  She briefly held her Eliquis from 9/16, had right knee surgery with [name] on 9/19 (did not mention to him regarding her ongoing rectal bleeding) and resumed Eliquis on 9/20.  She was seen in the ED on 9/3 for rectal bleeding x3 days, discharged home for outpatient follow-up.  Admitted for acute lower GI bleeding.  [facility] consulted and plan colonoscopy 9/24.   Assessment & Plan:  Principal Problem:   Rectal bleeding Active Problems:   History of pulmonary embolus (PE)   Constipation   Hypotension   H/O right knee surgery   Hematochezia   Family history of colon cancer   Lower abdominal pain   Acute lower GI bleeding: Unclear etiology.  Reports history of colonoscopy with [name] about 5 years ago and states that 5 "cancerous" polyps were removed.  Had an upcoming appointment to see Dr. [name] with [facility] in October.  Hemodynamically stable.  CT abdomen and pelvis without acute findings or bleeding source.  Eliquis on hold.  GI consultation appreciated and plan colonoscopy on 9/24.  Hemodynamically stable.  Hemoglobin stable.  No further rectal bleeding over the last 24 hours.  Acute blood loss anemia: Hemoglobin is dropped from 12.2 on 9/3-9.7 today.  Likely secondary to GI bleeding.  Follow CBC daily and transfuse if hemoglobin 7 g or less.  Hemoglobin stable  in the mid 7 g range.  Suprapubic burning pain: Etiology unclear.  CTA abdomen and pelvis without acute findings.  Does have significant stool burden which will be addressed during bowel prep for colonoscopy.  Urine microscopy not indicative of UTI.  Supportive care.  Continue monitoring.  Acute on chronic low back pain: Supportive treatment for now.  No focal neurological deficits on exam.  Etiology of worsening low back pain also unclear.  If does not improve with improvement of constipation and after evaluation of lower GI bleed, may need further evaluation.  Constipation: Will be addressed with bowel prep now but after that we will need to be on a bowel regimen.  History of PE/DVT: Diagnosed 5/31, thought to be provoked due to immobility from right knee pain.  May have completed the required duration of anticoagulation and will need to be discussed with her PCP.  Borderline/soft blood pressures: Continue to monitor closely.  S/p recent right knee surgery: Reportedly for meniscal tear repair on 9/16 with [name] with [facility].  Appears stable.    Body mass index is 28.11 kg/m.    DVT prophylaxis: SCDs Start: 11/26/20 0020     Code Status: Full Code Family Communication: None at bedside. Disposition:  Status is: Observation  The patient will require care spanning > 2 midnights and should be moved to inpatient because: Ongoing diagnostic testing needed not appropriate for outpatient work up  Dispo: The patient is from: Home  Anticipated d/c is to: Home              Patient currently is not medically stable to d/c.   Difficult to place patient No        Consultants:    [facility]  Procedures:   None  Antimicrobials:    Anti-infectives (From admission, onward)    None         Subjective:  Reports no BM since yesterday.  Hence no rectal bleeding.  Ongoing lower abdominal burning pain, states that it gets worse when she takes anything  by mouth including pain medications.  Also low back pain.  Has laid on the couch next to her bed and sat on the chair with better relief.  Objective:   Vitals:   11/26/20 0200 11/26/20 0520 11/27/20 0529 11/27/20 1505  BP: 122/73 98/65 (!) 96/56 113/76  Pulse: 60 (!) 52 63 66  Resp: 18 18 16 20   Temp: 98 F (36.7 C) 97.8 F (36.6 C) 97.8 F (36.6 C) 98.2 F (36.8 C)  TempSrc: Oral Oral Oral   SpO2: 100% (!) 89% 95% 100%  Weight:      Height:        General exam: Middle-age female, moderately built and overweight lying uncomfortably propped up in bed without distress. Respiratory system: Clear to auscultation.  No increased work of breathing. Cardiovascular system: S1 & S2 heard, RRR. No JVD, murmurs, rubs, gallops or clicks. No pedal edema. Gastrointestinal system: Abdomen is nondistended, soft, mild and inconsistent upper lower abdominal tenderness without peritoneal signs.  Normal bowel sounds heard. Central nervous system: Alert and oriented. No focal neurological deficits. Extremities: Symmetric 5 x 5 power. Skin: No rashes, lesions or ulcers Psychiatry: Judgement and insight appear normal. Mood & affect appropriate.     Data Reviewed:   I have personally reviewed following labs and imaging studies   CBC: Recent Labs  Lab 11/25/20 1538 11/26/20 0459 11/26/20 1033 11/27/20 0442  WBC 7.1 6.0 6.0 5.9  NEUTROABS 4.8  --   --   --   HGB 11.3* 9.4* 9.7* 9.7*  HCT 35.5* 30.3* 31.0* 31.2*  MCV 84.3 85.1 84.9 86.2  PLT 339 266 291 672    Basic Metabolic Panel: Recent Labs  Lab 11/25/20 1538  NA 136  K 3.9  CL 104  CO2 25  GLUCOSE 109*  BUN 11  CREATININE 0.81  CALCIUM 9.4    Liver Function Tests: Recent Labs  Lab 11/25/20 1538  AST 21  ALT 14  ALKPHOS 53  BILITOT 0.5  PROT 7.6  ALBUMIN 4.1    CBG: No results for input(s): GLUCAP in the last 168 hours.  Microbiology Studies:   Recent Results (from the past 240 hour(s))  Resp Panel by RT-PCR  (Flu A&B, Covid) Nasopharyngeal Swab     Status: None   Collection Time: 11/26/20 12:31 AM   Specimen: Nasopharyngeal Swab; Nasopharyngeal(NP) swabs in vial transport medium  Result Value Ref Range Status   SARS Coronavirus 2 by RT PCR NEGATIVE NEGATIVE Final    Comment: (NOTE) SARS-CoV-2 target nucleic acids are NOT DETECTED.  The SARS-CoV-2 RNA is generally detectable in upper respiratory specimens during the acute phase of infection. The lowest concentration of SARS-CoV-2 viral copies this assay can detect is 138 copies/mL. A negative result does not preclude SARS-Cov-2 infection and should not be used as the sole basis for treatment or other patient management decisions. A negative result may occur with  improper specimen collection/handling,  submission of specimen other than nasopharyngeal swab, presence of viral mutation(s) within the areas targeted by this assay, and inadequate number of viral copies(<138 copies/mL). A negative result must be combined with clinical observations, patient history, and epidemiological information. The expected result is Negative.  Fact Sheet for Patients:  EntrepreneurPulse.com.au  Fact Sheet for Healthcare Providers:  IncredibleEmployment.be  This test is no t yet approved or cleared by the [place] FDA and  has been authorized for detection and/or diagnosis of SARS-CoV-2 by FDA under an Emergency Use Authorization (EUA). This EUA will remain  in effect (meaning this test can be used) for the duration of the COVID-19 declaration under Section 564(b)(1) of the Act, 21 U.S.C.section 360bbb-3(b)(1), unless the authorization is terminated  or revoked sooner.       Influenza A by PCR NEGATIVE NEGATIVE Final   Influenza B by PCR NEGATIVE NEGATIVE Final    Comment: (NOTE) The Xpert Xpress SARS-CoV-2/FLU/RSV plus assay is intended as an aid in the diagnosis of influenza from Nasopharyngeal swab specimens  and should not be used as a sole basis for treatment. Nasal washings and aspirates are unacceptable for Xpert Xpress SARS-CoV-2/FLU/RSV testing.  Fact Sheet for Patients: EntrepreneurPulse.com.au  Fact Sheet for Healthcare Providers: IncredibleEmployment.be  This test is not yet approved or cleared by the [place] FDA and has been authorized for detection and/or diagnosis of SARS-CoV-2 by FDA under an Emergency Use Authorization (EUA). This EUA will remain in effect (meaning this test can be used) for the duration of the COVID-19 declaration under Section 564(b)(1) of the Act, 21 U.S.C. section 360bbb-3(b)(1), unless the authorization is terminated or revoked.  Performed at [facility], [place] [address], [place], [place] [address]      Radiology Studies:  CT Angio Abd/Pel W and/or Wo Contrast  Result Date: 11/25/2020 CLINICAL DATA:  Bright red blood per rectum.  GI bleed. EXAM: CTA ABDOMEN AND PELVIS WITHOUT AND WITH CONTRAST TECHNIQUE: Multidetector CT imaging of the abdomen and pelvis was performed using the standard protocol during bolus administration of intravenous contrast. Multiplanar reconstructed images and MIPs were obtained and reviewed to evaluate the vascular anatomy. CONTRAST:  191m OMNIPAQUE IOHEXOL 350 MG/ML SOLN COMPARISON:  CT dated 07/22/2020. FINDINGS: VASCULAR Aorta: Normal caliber aorta without aneurysm, dissection, vasculitis or significant stenosis. Celiac: Patent without evidence of aneurysm, dissection, vasculitis or significant stenosis. SMA: Patent without evidence of aneurysm, dissection, vasculitis or significant stenosis. Renals: Both renal arteries are patent without evidence of aneurysm, dissection, vasculitis, fibromuscular dysplasia or significant stenosis. IMA: Patent without evidence of aneurysm, dissection, vasculitis or significant stenosis. Inflow: Patent without evidence of aneurysm,  dissection, vasculitis or significant stenosis. Proximal Outflow: Bilateral common femoral and visualized portions of the superficial and profunda femoral arteries are patent without evidence of aneurysm, dissection, vasculitis or significant stenosis. Veins: The IVC is unremarkable.  No portal venous gas. Review of the MIP images confirms the above findings. NON-VASCULAR Lower chest: Minimal bibasilar linear atelectasis/scarring. The visualized lung bases are otherwise clear. No intra-abdominal free air or free fluid. Hepatobiliary: The liver is unremarkable. No intrahepatic biliary dilatation. Layering sludge within the gallbladder. No pericholecystic fluid. Pancreas: Unremarkable. No pancreatic ductal dilatation or surrounding inflammatory changes. Spleen: Normal in size without focal abnormality. Adrenals/Urinary Tract: The adrenal glands are unremarkable. The kidneys, visualized ureters, and urinary bladder appear unremarkable. Stomach/Bowel: There is moderate stool throughout the colon. There is no bowel obstruction or active inflammation. No extravasated contrast or evidence of active GI bleed. The  appendix is normal. Lymphatic: No adenopathy. Reproductive: The uterus and ovaries are grossly unremarkable. Other: None Musculoskeletal: No acute or significant osseous findings. IMPRESSION: 1. No acute intra-abdominal or pelvic pathology. No evidence of active GI bleed. 2. Moderate colonic stool burden. No bowel obstruction. Normal appendix. 3. Gallbladder sludge. Electronically Signed   By: Anner Crete M.D.   On: 11/25/2020 22:39     Scheduled Meds:    dicyclomine  20 mg Oral TID AC   escitalopram  15 mg Oral Daily   influenza vac split quadrivalent PF  0.5 mL Intramuscular Tomorrow-1000   peg 3350 powder  0.5 kit Oral Once   And   [START ON 11/28/2020] peg 3350 powder  0.5 kit Oral Once   traZODone  50-100 mg Oral QHS    Continuous Infusions:       LOS: 1 day     Vernell Leep, MD,  FACP, [facility]. [facility]    To contact the attending provider between 7A-7P or the covering provider during after hours 7P-7A, please log into the web site [email or web address] and access using universal [place] password for that web site. If you do not have the password, please call the hospital operator.  11/27/2020, 4:37 PM

## 2020-11-27 NOTE — H&P (View-Only) (Signed)
Progress Note Hospital Day: 3  Chief Complaint:         ASSESSMENT AND PLAN   # 59 year old female with intermittent lower abdominal pain / rectal bleeding for 2 years.  Prior CT during admission for rectal bleeding suggested sigmoid colitis / proctitis in May 2022.   Now with progressive bleeding as well as worsening lower abdominal pain and declined in hgb. Home Eliquis is on hold. CTA abd / pelvis this admission negative for active GI bleed or any acute GI abnormalities ( or residual colitis). Abdominal pain and worsening back pain may be unrelated to the bleeding. Rule out colon polyp, neoplasm, AVM. Diverticular bleed seems less likely given chronicity but certainly still a consideration.  --Scheduled to have a colonoscopy in am.  The risks and benefits of colonoscopy with possible polypectomy / biopsies were discussed and the patient agrees to proceed.    # RLE DVT post knee surgery. Eliquis on hold      DIAGNOSTIC STUDIES THIS ADMISSION:  07/22/20 CTA abd / pelvis for GI bleeding IMPRESSION: 1. Improved but persistent wall thickening of the sigmoid colon and rectum, suggestive of infectious or inflammatory colitis/proctitis. No pneumatosis or signs of perforation. 2. Mesenteric vasculature is patent. No evidence of active gastrointestinal bleed. If continued clinical concern for active gastrointestinal bleed consider further evaluation with nuclear medicine tagged red blood cell scan. 3. Colonic diverticulosis without findings of acute diverticulitis    SUBJECTIVE   Feels okay. No BMs  or bleeding today.        OBJECTIVE      Scheduled inpatient medications:   bisacodyl  10 mg Oral Once   dicyclomine  20 mg Oral TID AC   escitalopram  15 mg Oral Daily   influenza vac split quadrivalent PF  0.5 mL Intramuscular Tomorrow-1000   traZODone  50-100 mg Oral QHS   Continuous inpatient infusions:  PRN inpatient medications: acetaminophen, cyclobenzaprine,  HYDROmorphone (DILAUDID) injection, oxyCODONE-acetaminophen, polyvinyl alcohol  Vital signs in last 24 hours: Temp:  [97.8 F (36.6 C)] 97.8 F (36.6 C) (09/23 0529) Pulse Rate:  [63] 63 (09/23 0529) Resp:  [16] 16 (09/23 0529) BP: (96)/(56) 96/56 (09/23 0529) SpO2:  [95 %] 95 % (09/23 0529)    Intake/Output Summary (Last 24 hours) at 11/27/2020 0910 Last data filed at 11/26/2020 1330 Gross per 24 hour  Intake 820 ml  Output --  Net 820 ml     Physical Exam:  General: Alert female in NAD Heart:  Regular rate and rhythm. No lower extremity edema Pulmonary: Normal respiratory effort Abdomen: Soft, nondistended, nontender. Normal bowel sounds.  Neurologic: Alert and oriented Psych: Pleasant. Cooperative.   Filed Weights   11/25/20 1525  Weight: 79 kg    Intake/Output from previous day: 09/22 0701 - 09/23 0700 In: 820 [P.O.:820] Out: -  Intake/Output this shift: No intake/output data recorded.    Lab Results: Recent Labs    11/26/20 0459 11/26/20 1033 11/27/20 0442  WBC 6.0 6.0 5.9  HGB 9.4* 9.7* 9.7*  HCT 30.3* 31.0* 31.2*  PLT 266 291 273   BMET Recent Labs    11/25/20 1538  NA 136  K 3.9  CL 104  CO2 25  GLUCOSE 109*  BUN 11  CREATININE 0.81  CALCIUM 9.4   LFT Recent Labs    11/25/20 1538  PROT 7.6  ALBUMIN 4.1  AST 21  ALT 14  ALKPHOS 53  BILITOT 0.5     Principal Problem:  Rectal bleeding Active Problems:   History of pulmonary embolus (PE)   Constipation   Hypotension   H/O right knee surgery   Hematochezia   Family history of colon cancer   Lower abdominal pain     LOS: 1 day   Tye Savoy ,NP 11/27/2020, 9:10 AM

## 2020-11-28 ENCOUNTER — Encounter (HOSPITAL_COMMUNITY)
Admission: EM | Disposition: A | Discharge status: Home / Self Care | Payer: Self-pay | Source: Home / Self Care | Attending: Internal Medicine

## 2020-11-28 ENCOUNTER — Inpatient Hospital Stay (HOSPITAL_COMMUNITY): Payer: Medicare Other

## 2020-11-28 ENCOUNTER — Inpatient Hospital Stay (HOSPITAL_COMMUNITY): Payer: Medicare Other | Admitting: Registered Nurse

## 2020-11-28 ENCOUNTER — Encounter (HOSPITAL_COMMUNITY): Payer: Self-pay | Admitting: Family Medicine

## 2020-11-28 DIAGNOSIS — M545 Low back pain, unspecified: Secondary | ICD-10-CM

## 2020-11-28 DIAGNOSIS — K921 Melena: Principal | ICD-10-CM

## 2020-11-28 DIAGNOSIS — K922 Gastrointestinal hemorrhage, unspecified: Secondary | ICD-10-CM

## 2020-11-28 LAB — CBC
HCT: 36.8 % (ref 36.0–46.0)
Hemoglobin: 11.1 g/dL — ABNORMAL LOW (ref 12.0–15.0)
MCH: 26.4 pg (ref 26.0–34.0)
MCHC: 30.2 g/dL (ref 30.0–36.0)
MCV: 87.4 fL (ref 80.0–100.0)
Platelets: 387 10*3/uL (ref 150–400)
RBC: 4.21 MIL/uL (ref 3.87–5.11)
RDW: 16.4 % — ABNORMAL HIGH (ref 11.5–15.5)
WBC: 7.8 10*3/uL (ref 4.0–10.5)
nRBC: 0 % (ref 0.0–0.2)

## 2020-11-28 SURGERY — INVASIVE LAB ABORTED CASE
Anesthesia: Monitor Anesthesia Care

## 2020-11-28 MED ORDER — PROPOFOL 10 MG/ML IV BOLUS
INTRAVENOUS | Status: DC | PRN
  Administered 2020-11-28: 30 mg via INTRAVENOUS
  Administered 2020-11-28: 50 mg via INTRAVENOUS

## 2020-11-28 MED ORDER — PROPOFOL 1000 MG/100ML IV EMUL
INTRAVENOUS | Status: AC
  Filled 2020-11-28: qty 100

## 2020-11-28 MED ORDER — PROPOFOL 500 MG/50ML IV EMUL
INTRAVENOUS | Status: DC | PRN
  Administered 2020-11-28: 50 ug/kg/min via INTRAVENOUS

## 2020-11-28 MED ORDER — LIDOCAINE 5 % EX PTCH
1.0000 | MEDICATED_PATCH | Freq: Every day | CUTANEOUS | Status: DC
  Administered 2020-11-28: 1 via TRANSDERMAL
  Filled 2020-11-28 (×2): qty 1

## 2020-11-28 MED ORDER — APIXABAN 5 MG PO TABS
5.0000 mg | ORAL_TABLET | Freq: Two times a day (BID) | ORAL | Status: DC
  Administered 2020-11-28 – 2020-11-29 (×2): 5 mg via ORAL
  Filled 2020-11-28 (×3): qty 1

## 2020-11-28 MED ORDER — LACTATED RINGERS IV SOLN
INTRAVENOUS | Status: DC | PRN
Start: 2020-11-28 — End: 2020-11-28

## 2020-11-28 MED ORDER — METHOCARBAMOL 500 MG PO TABS
500.0000 mg | ORAL_TABLET | Freq: Three times a day (TID) | ORAL | Status: DC
  Administered 2020-11-28 – 2020-11-29 (×4): 500 mg via ORAL
  Filled 2020-11-28 (×4): qty 1

## 2020-11-28 MED ORDER — PROPOFOL 1000 MG/100ML IV EMUL
INTRAVENOUS | Status: AC
  Filled 2020-11-28: qty 200

## 2020-11-28 MED ORDER — HYDROCORTISONE ACETATE 25 MG RE SUPP
25.0000 mg | Freq: Every day | RECTAL | Status: DC
  Administered 2020-11-28: 25 mg via RECTAL
  Filled 2020-11-28 (×2): qty 1

## 2020-11-28 SURGICAL SUPPLY — 22 items

## 2020-11-28 NOTE — Anesthesia Preprocedure Evaluation (Addendum)
Anesthesia Evaluation  Patient identified by MRN, date of birth, ID band Patient awake    Reviewed: Allergy & Precautions, NPO status , Patient's Chart, lab work & pertinent test results  Airway Mallampati: III  TM Distance: >3 FB Neck ROM: Full    Dental  (+) Missing, Chipped, Dental Advisory Given,    Pulmonary PE   Pulmonary exam normal breath sounds clear to auscultation       Cardiovascular + DVT  Normal cardiovascular exam Rhythm:Regular Rate:Normal  TTE 2022 1. Left ventricular ejection fraction by 3D volume is 59 %. The left  ventricle has normal function. The left ventricle has no regional wall  motion abnormalities. Left ventricular diastolic parameters are consistent  with Grade I diastolic dysfunction  (impaired relaxation). The average left ventricular global longitudinal  strain is -22.2 %. The global longitudinal strain is normal.  2. Right ventricular systolic function is normal. The right ventricular  size is normal. Tricuspid regurgitation signal is inadequate for assessing  PA pressure.  3. The mitral valve is normal in structure. Trivial mitral valve  regurgitation. No evidence of mitral stenosis.  4. The aortic valve is grossly normal. Aortic valve regurgitation is not  visualized. No aortic stenosis is present.  5. The inferior vena cava is normal in size with greater than 50%  respiratory variability, suggesting right atrial pressure of 3 mmHg.    Neuro/Psych PSYCHIATRIC DISORDERS Depression negative neurological ROS     GI/Hepatic negative GI ROS, Neg liver ROS,   Endo/Other  negative endocrine ROS  Renal/GU negative Renal ROS  negative genitourinary   Musculoskeletal  (+) Arthritis , narcotic dependent  Abdominal   Peds  Hematology  (+) Blood dyscrasia (on eliquis, Hgb 11.1), ,   Anesthesia Other Findings presented to the ED with 1 week of bright red rectal bleeding   Reproductive/Obstetrics                            Anesthesia Physical Anesthesia Plan  ASA: 3  Anesthesia Plan: MAC   Post-op Pain Management:    Induction: Intravenous  PONV Risk Score and Plan: 2 and Propofol infusion and Treatment may vary due to age or medical condition  Airway Management Planned: Natural Airway  Additional Equipment:   Intra-op Plan:   Post-operative Plan:   Informed Consent: I have reviewed the patients History and Physical, chart, labs and discussed the procedure including the risks, benefits and alternatives for the proposed anesthesia with the patient or authorized representative who has indicated his/her understanding and acceptance.     Dental advisory given  Plan Discussed with: CRNA  Anesthesia Plan Comments:         Anesthesia Quick Evaluation

## 2020-11-28 NOTE — Plan of Care (Signed)
  Problem: Education: Goal: Knowledge of General Education information will improve Description: Including pain rating scale, medication(s)/side effects and non-pharmacologic comfort measures Outcome: Progressing   Problem: Health Behavior/Discharge Planning: Goal: Ability to manage health-related needs will improve Outcome: Progressing   Problem: Clinical Measurements: Goal: Ability to maintain clinical measurements within normal limits will improve Outcome: Progressing Goal: Will remain free from infection Outcome: Progressing Goal: Diagnostic test results will improve Outcome: Progressing Goal: Respiratory complications will improve Outcome: Progressing Goal: Cardiovascular complication will be avoided Outcome: Progressing   Problem: Coping: Goal: Level of anxiety will decrease Outcome: Progressing   Problem: Elimination: Goal: Will not experience complications related to bowel motility Outcome: Progressing Goal: Will not experience complications related to urinary retention Outcome: Progressing   Problem: Safety: Goal: Ability to remain free from injury will improve Outcome: Progressing   Problem: Skin Integrity: Goal: Risk for impaired skin integrity will decrease Outcome: Progressing

## 2020-11-28 NOTE — Transfer of Care (Signed)
Immediate Anesthesia Transfer of Care Note  Patient: Brittney Carter  Procedure(s) Performed: INVASIVE LAB ABORTED CASE  Patient Location: PACU  Anesthesia Type:MAC  Level of Consciousness: awake, alert , oriented and patient cooperative  Airway & Oxygen Therapy: Patient Spontanous Breathing and Patient connected to face mask oxygen  Post-op Assessment: Report given to RN, Post -op Vital signs reviewed and stable and Patient moving all extremities X 4  Post vital signs: stable  Last Vitals:  Vitals Value Taken Time  BP 110/73 11/28/20 0930  Temp 36.6 C 11/28/20 0925  Pulse 61 11/28/20 0934  Resp 21 11/28/20 0934  SpO2 93 % 11/28/20 0934  Vitals shown include unvalidated device data.  Last Pain:  Vitals:   11/28/20 0925  TempSrc:   PainSc: 0-No pain      Patients Stated Pain Goal: 5 (35/24/81 8590)  Complications: No notable events documented.

## 2020-11-28 NOTE — Progress Notes (Addendum)
With [employer] GI PROGRESS NOTE   Brittney Carter  HQI:[id number]    DOB: 09/11/1960    DOA: 11/25/2020  PCP: [name], MD   I have briefly reviewed patients previous medical records in [facility].  Chief Complaint  Patient presents with   Rectal Bleeding    Brief Narrative:  60 year old female with medical history significant for GI bleed, PE/DVT on Eliquis, chronic back pain and depression presented to the ED approximately 1 week history of bright red rectal bleeding with clots, varying volume, associated with burning lower abdominal pain, worsening chronic low back pain, decreased appetite and chills.  She briefly held her Eliquis from 9/16, had right knee surgery with [name] on 9/19 (did not mention to him regarding her ongoing rectal bleeding) and resumed Eliquis on 9/20.  She was seen in the ED on 9/3 for rectal bleeding x3 days, discharged home for outpatient follow-up.  Admitted for acute lower GI bleeding.  [facility] consulted, colonoscopy 9/24 with poor prep but no significant findings.   Assessment & Plan:  Principal Problem:   Rectal bleeding Active Problems:   History of pulmonary embolus (PE)   Constipation   Hypotension   H/O right knee surgery   Hematochezia   Family history of colon cancer   Lower abdominal pain   Lower GI bleed   Acute lower GI bleeding: Unclear etiology.  Reports history of colonoscopy with [name] about 5 years ago and states that 5 "cancerous" polyps were removed.  CT abdomen and pelvis without acute findings or bleeding source.  Eliquis on hold.  [facility] consulted.  Colonoscopy 9/24: Poor prep, stool in entire examined colon but no significant findings.  Will need repeat colonoscopy as outpatient with adequate bowel prep, outpatient follow-up with Dr. [name], resume Eliquis and hydrocortisone suppository x1 week.  Resolved.  Acute blood loss anemia: Hemoglobin is dropped from 12.2 on 9/3-9.7 today.  Likely secondary to GI  bleeding.  Hemoglobin stable and even improved to 11 today.  Suprapubic burning pain: Etiology unclear.  CTA abdomen and pelvis without acute findings. Urine microscopy not indicative of UTI.  Had bowel prep for colonoscopy.  Today indicates that she has acute on chronic low back pain radiating around her trunk to lower abdomen.  Requested and follow-up MRI of the lumbar spine.  Acute on chronic low back pain: History of back surgery and spinal stimulator with [name], [facility].  Spinal stimulator had to be removed because it was displaced.  Now reports approximately 1 week of worsening pain.  Has used several doses of IV and oral opioids here.  No neurological deficits or sphincter involvement.  Requested and follow MRI of the lumbar spine for further recommendations.  Added lidocaine patch, scheduled Robaxin and discontinued as needed Flexeril.  Constipation: Bowel regimen.  History of PE/DVT: Diagnosed 5/31, thought to be provoked due to immobility from right knee pain.  May have completed the required duration of anticoagulation and defer decision to continue or stop Eliquis to PCP during outpatient follow-up.  Resumed Eliquis now.  Borderline/soft blood pressures: Blood pressures have normalized with  S/p recent right knee surgery: Reportedly for meniscal tear repair on 9/16 with [name] with [facility] orthopedics.  Appears stable.  Body mass index is 28.11 kg/m.    DVT prophylaxis: SCDs Start: 11/26/20 0020     Code Status: Full Code Family Communication: None at bedside. Disposition:  Status is: Observation  The patient will require care spanning > 2  midnights and should be moved to inpatient because: Ongoing diagnostic testing needed not appropriate for outpatient work up  Dispo: The patient is from: Home              Anticipated d/c is to: Home possibly 9/25 pending L-spine MRI, pain control              Patient currently is not medically stable to d/c.   Difficult to  place patient No        Consultants:   [facility]  Procedures:   Colonoscopy 11/28/2020:  Impression:  - Preparation of the colon was poor. - Stool in the entire examined colon. - The rectum, sigmoid colon, descending colon, splenic flexure and transverse colon are normal. - External and internal hemorrhoids. - No specimens collected.  Recommendations:  - Resume previous diet. - Continue present medications. - Repeat colonoscopy at the next available appointment because the bowel preparation was suboptimal. To be scheduled as outpatient - Return to [facility] clinic at the next available appointment to be scheduled with Dr.[name]. - Resume Eliquis (apixaban) at prior dose today from GI standpoint.  - Use hydrocortisone suppository 25 mg 1 per rectum once a day for 7 days. - GI will sign off, available if have any questions  Antimicrobials:    Anti-infectives (From admission, onward)    None         Subjective:  Seen this morning prior to procedure.  Bowel prep overnight with tinge of blood on return fluid.  Ongoing significant low back pain, new since approximately a week, today indicates that this radiates around her trunk to the lower abdomen but denies lower extremity weakness, numbness or tingling.  No sphincter problems.  Objective:   Vitals:   11/28/20 0940 11/28/20 0943 11/28/20 0945 11/28/20 1010  BP:  112/83  139/87  Pulse: (!) 58  (!) 59 (!) 59  Resp: 18  17 20   Temp:  (!) 97.5 F (36.4 C)  98 F (36.7 C)  TempSrc:      SpO2: 91%  92% 99%  Weight:      Height:        General exam: Middle-age female, moderately built and overweight lying uncomfortably propped up in bed without distress. Respiratory system: Clear to auscultation.  No increased work of breathing. Cardiovascular system: S1 & S2 heard, RRR. No JVD, murmurs, rubs, gallops or clicks. No pedal edema. Gastrointestinal system: Abdomen is nondistended, soft and nontender.  Normal bowel  sounds heard. Central nervous system: Alert and oriented. No focal neurological deficits. Extremities: Symmetric 5 x 5 power. Skin: No rashes, lesions or ulcers Psychiatry: Judgement and insight appear normal. Mood & affect appropriate.     Data Reviewed:   I have personally reviewed following labs and imaging studies   CBC: Recent Labs  Lab 11/25/20 1538 11/26/20 0459 11/26/20 1033 11/27/20 0442 11/28/20 0456  WBC 7.1   < > 6.0 5.9 7.8  NEUTROABS 4.8  --   --   --   --   HGB 11.3*   < > 9.7* 9.7* 11.1*  HCT 35.5*   < > 31.0* 31.2* 36.8  MCV 84.3   < > 84.9 86.2 87.4  PLT 339   < > 291 273 387   < > = values in this interval not displayed.    Basic Metabolic Panel: Recent Labs  Lab 11/25/20 1538  NA 136  K 3.9  CL 104  CO2 25  GLUCOSE 109*  BUN  11  CREATININE 0.81  CALCIUM 9.4    Liver Function Tests: Recent Labs  Lab 11/25/20 1538  AST 21  ALT 14  ALKPHOS 53  BILITOT 0.5  PROT 7.6  ALBUMIN 4.1    CBG: No results for input(s): GLUCAP in the last 168 hours.  Microbiology Studies:   Recent Results (from the past 240 hour(s))  Resp Panel by RT-PCR (Flu A&B, Covid) Nasopharyngeal Swab     Status: None   Collection Time: 11/26/20 12:31 AM   Specimen: Nasopharyngeal Swab; Nasopharyngeal(NP) swabs in vial transport medium  Result Value Ref Range Status   SARS Coronavirus 2 by RT PCR NEGATIVE NEGATIVE Final    Comment: (NOTE) SARS-CoV-2 target nucleic acids are NOT DETECTED.  The SARS-CoV-2 RNA is generally detectable in upper respiratory specimens during the acute phase of infection. The lowest concentration of SARS-CoV-2 viral copies this assay can detect is 138 copies/mL. A negative result does not preclude SARS-Cov-2 infection and should not be used as the sole basis for treatment or other patient management decisions. A negative result may occur with  improper specimen collection/handling, submission of specimen other than nasopharyngeal swab,  presence of viral mutation(s) within the areas targeted by this assay, and inadequate number of viral copies(<138 copies/mL). A negative result must be combined with clinical observations, patient history, and epidemiological information. The expected result is Negative.  Fact Sheet for Patients:  EntrepreneurPulse.com.au  Fact Sheet for Healthcare Providers:  IncredibleEmployment.be  This test is no t yet approved or cleared by the [place] FDA and  has been authorized for detection and/or diagnosis of SARS-CoV-2 by FDA under an Emergency Use Authorization (EUA). This EUA will remain  in effect (meaning this test can be used) for the duration of the COVID-19 declaration under Section 564(b)(1) of the Act, 21 U.S.C.section 360bbb-3(b)(1), unless the authorization is terminated  or revoked sooner.       Influenza A by PCR NEGATIVE NEGATIVE Final   Influenza B by PCR NEGATIVE NEGATIVE Final    Comment: (NOTE) The Xpert Xpress SARS-CoV-2/FLU/RSV plus assay is intended as an aid in the diagnosis of influenza from Nasopharyngeal swab specimens and should not be used as a sole basis for treatment. Nasal washings and aspirates are unacceptable for Xpert Xpress SARS-CoV-2/FLU/RSV testing.  Fact Sheet for Patients: EntrepreneurPulse.com.au  Fact Sheet for Healthcare Providers: IncredibleEmployment.be  This test is not yet approved or cleared by the [place] FDA and has been authorized for detection and/or diagnosis of SARS-CoV-2 by FDA under an Emergency Use Authorization (EUA). This EUA will remain in effect (meaning this test can be used) for the duration of the COVID-19 declaration under Section 564(b)(1) of the Act, 21 U.S.C. section 360bbb-3(b)(1), unless the authorization is terminated or revoked.  Performed at [facility], [place] [address], [place], [place] [address]       Radiology Studies:  No results found.   Scheduled Meds:    dicyclomine  20 mg Oral TID AC   escitalopram  15 mg Oral Daily   influenza vac split quadrivalent PF  0.5 mL Intramuscular Tomorrow-1000   lidocaine  1 patch Transdermal Daily   methocarbamol  500 mg Oral TID   traZODone  50-100 mg Oral QHS    Continuous Infusions:       LOS: 2 days     Vernell Leep, MD, FACP, [facility]. [facility]    To contact the attending provider between 7A-7P or the covering provider during after hours 7P-7A,  please log into the web site [email or web address] and access using universal [place] password for that web site. If you do not have the password, please call the hospital operator.  11/28/2020, 1:25 PM

## 2020-11-28 NOTE — Interval H&P Note (Signed)
History and Physical Interval Note:  11/28/2020 8:48 AM  Brittney Carter  has presented today for surgery, with the diagnosis of Hematochezia.  The various methods of treatment have been discussed with the patient and family. After consideration of risks, benefits and other options for treatment, the patient has consented to  Procedure(s): COLONOSCOPY WITH PROPOFOL (N/A) as a surgical intervention.  The patient's history has been reviewed, patient examined, no change in status, stable for surgery.  I have reviewed the patient's chart and labs.  Questions were answered to the patient's satisfaction.     [name]

## 2020-11-28 NOTE — Anesthesia Postprocedure Evaluation (Signed)
Anesthesia Post Note  Patient: Brittney Carter  Procedure(s) Performed: INVASIVE LAB ABORTED CASE     Patient location during evaluation: PACU Anesthesia Type: MAC Level of consciousness: awake and alert Pain management: pain level controlled Vital Signs Assessment: post-procedure vital signs reviewed and stable Respiratory status: spontaneous breathing, nonlabored ventilation, respiratory function stable and patient connected to nasal cannula oxygen Cardiovascular status: blood pressure returned to baseline and stable Postop Assessment: no apparent nausea or vomiting Anesthetic complications: no   No notable events documented.  Last Vitals:  Vitals:   11/28/20 0945 11/28/20 1010  BP:  139/87  Pulse: (!) 59 (!) 59  Resp: 17 20  Temp:  36.7 C  SpO2: 92% 99%    Last Pain:  Vitals:   11/28/20 0943  TempSrc:   PainSc: 0-No pain                 Starlit Raburn L Dejion Grillo

## 2020-11-28 NOTE — Progress Notes (Signed)
The patient is injury-free, afebrile, alert, and oriented X 4. Vital signs were within the baseline during this shift. Pt's pain was controlled with current pain regiment. She finished her bowel prep at 400. She denies chest pain, SOB, nausea, vomiting, dizziness, signs or symptoms of bleeding, or acute changes during this shift. We will continue to monitor and work toward achieving the care plan goals

## 2020-11-28 NOTE — Op Note (Signed)
[facility] Patient Name: Brittney Carter Procedure Date: 11/28/2020 MRN: [id number] Attending MD: Mauri Pole , MD Date of Birth: [birth year] CSN: [id number] Age: 60 Admit Type: Inpatient Procedure:                Colonoscopy Indications:              Evaluation of unexplained GI bleeding presenting                            with Hematochezia, Abnormal CT of the GI tract Providers:                Mauri Pole, MD, Elmer Ramp. Tilden Dome, RN, Cletis Athens, Technician, Enrigue Catena, CRNA Referring MD:              Medicines:                Monitored Anesthesia Care Complications:            No immediate complications. Estimated Blood Loss:     Estimated blood loss: none. Procedure:                Pre-Anesthesia Assessment:                           - Prior to the procedure, a History and Physical                            was performed, and patient medications and                            allergies were reviewed. The patient's tolerance of                            previous anesthesia was also reviewed. The risks                            and benefits of the procedure and the sedation                            options and risks were discussed with the patient.                            All questions were answered, and informed consent                            was obtained. Prior Anticoagulants: The patient                            last took Eliquis (apixaban) 7 days prior to the                            procedure. ASA Grade Assessment: III - A patient  with severe systemic disease. After reviewing the                            risks and benefits, the patient was deemed in                            satisfactory condition to undergo the procedure.                           After obtaining informed consent, the colonoscope                            was passed under direct vision. Throughout the                             procedure, the patient's blood pressure, pulse, and                            oxygen saturations were monitored continuously. The                            [id number]-HQ190L ([id number]) Olympus colonoscope was                            introduced through the anus with the intention of                            advancing to the cecum. The scope was advanced to                            the transverse colon before the procedure was                            aborted. Medications were given. The colonoscopy                            was somewhat difficult due to poor bowel prep with                            stool present. The patient tolerated the procedure                            well. The quality of the bowel preparation was                            poor. The rectum was photographed. Scope In: 9:04:53 AM Scope Out: 9:19:14 AM Total Procedure Duration: 0 hours 14 minutes 21 seconds  Findings:      The perianal and digital rectal examinations were normal.      A large amount of semi-liquid semi-solid stool was found in the entire       colon, making visualization difficult. Lavage of the area was performed,       resulting in incomplete clearance with continued poor visualization.  The rectum, sigmoid colon, descending colon, splenic flexure and       transverse colon appeared normal.      External and internal hemorrhoids were found during retroflexion. The       hemorrhoids were medium-sized. Impression:               - Preparation of the colon was poor.                           - Stool in the entire examined colon.                           - The rectum, sigmoid colon, descending colon,                            splenic flexure and transverse colon are normal.                           - External and internal hemorrhoids.                           - No specimens collected. Moderate Sedation:      Not Applicable - Patient had care per  Anesthesia. Recommendation:           - Patient has a contact number available for                            emergencies. The signs and symptoms of potential                            delayed complications were discussed with the                            patient. Return to normal activities tomorrow.                            Written discharge instructions were provided to the                            patient.                           - Resume previous diet.                           - Continue present medications.                           - Repeat colonoscopy at the next available                            appointment because the bowel preparation was                            suboptimal. To be scheduled as outpatient                           -  Return to [facility] at the next available                            appointment to be scheduled with Dr.[name].                           - Resume Eliquis (apixaban) at prior dose today                            from GI standpoint. Refer to managing physician for                            further adjustment of therapy.                           - Use hydrocortisone suppository 25 mg 1 per rectum                            once a day for 7 days.                           - GI will sign off, available if have any questions Procedure Code(s):        --- Professional ---                           [phone], 53, Colonoscopy, flexible; diagnostic,                            including collection of specimen(s) by brushing or                            washing, when performed (separate procedure) Diagnosis Code(s):        --- Professional ---                           K64.8, Other hemorrhoids                           K92.1, Melena (includes Hematochezia)                           R93.3, Abnormal findings on diagnostic imaging of                            other parts of digestive tract CPT copyright 2019 American Medical Association.  All rights reserved. The codes documented in this report are preliminary and upon coder review may  be revised to meet current compliance requirements. Mauri Pole, MD 11/28/2020 9:36:42 AM This report has been signed electronically. Number of Addenda: 0

## 2020-11-28 NOTE — Anesthesia Procedure Notes (Signed)
Procedure Name: MAC Date/Time: 11/28/2020 8:53 AM Performed by: Freddrick March, MD Pre-anesthesia Checklist: Patient identified, Emergency Drugs available, Suction available and Patient being monitored Patient Re-evaluated:Patient Re-evaluated prior to induction Oxygen Delivery Method: Simple face mask Placement Confirmation: positive ETCO2

## 2020-11-29 DIAGNOSIS — F1120 Opioid dependence, uncomplicated: Secondary | ICD-10-CM

## 2020-11-29 MED ORDER — DULCOLAX 5 MG PO TBEC: 5.0000 mg | DELAYED_RELEASE_TABLET | Freq: Every day | ORAL | 1 refills | Status: AC | PRN

## 2020-11-29 MED ORDER — DICYCLOMINE HCL 20 MG PO TABS: 20.0000 mg | ORAL_TABLET | Freq: Three times a day (TID) | ORAL | 0 refills | Status: DC

## 2020-11-29 MED ORDER — POLYETHYLENE GLYCOL 3350 17 G PO PACK: 17.0000 g | PACK | Freq: Every day | ORAL | 3 refills | Status: AC

## 2020-11-29 MED ORDER — METHOCARBAMOL 500 MG PO TABS: 500.0000 mg | ORAL_TABLET | Freq: Three times a day (TID) | ORAL | 0 refills | Status: DC | PRN

## 2020-11-29 MED ORDER — PERCOCET 5-325 MG PO TABS: 1.0000 | ORAL_TABLET | Freq: Three times a day (TID) | ORAL | 0 refills | Status: DC | PRN

## 2020-11-29 NOTE — Discharge Summary (Addendum)
Physician Discharge Summary  Brittney Carter JSH:[id number] DOB: November 30, 1960 DOA: 11/25/2020  PCP: [name], MD  Admit date: 11/25/2020 Discharge date: 11/29/2020  Admitted From: home  Disposition:  home   Recommendations for Outpatient Follow-up:  Will need f/u with GI for colonoscopy Recommend re-evaluation of back pain in 2 wks and possible referral to pain management    Home Health:  none  Discharge Condition:  stable   CODE STATUS:  Full code   Diet recommendation:  heart healthy Consultations: . GI  Procedures/Studies: Colonoscopy    Discharge Diagnoses:  Principal Problem:   Rectal bleeding Active Problems:   History of pulmonary embolus (PE)   Lower abdominal pain   Lower GI bleed   Constipation   Hypotension   H/O right knee surgery   Hematochezia   Family history of colon cancer      Brief Summary: 60 year old female with medical history significant for GI bleed, PE/DVT on Eliquis, chronic back pain and depression presented to the ED approximately 1 week history of bright red rectal bleeding with clots, varying volume, associated with burning lower abdominal pain, worsening chronic low back pain, decreased appetite and chills.  She briefly held her Eliquis from 9/16, had right knee surgery with [name] on 9/19 (did not mention to him regarding her ongoing rectal bleeding) and resumed Eliquis on 9/20.  She was seen in the ED on 9/3 for rectal bleeding x3 days, discharged home for outpatient follow-up.  Admitted for acute lower GI bleeding.  [facility] consulted, colonoscopy 9/24 with poor prep but no significant findings.  I have seen the patient for the first time today as I am filling in for [name] today.   Hospital Course:  Acute lower GI bleeding: Unclear etiology.  Reports history of colonoscopy with [name] about 5 years ago and states that 5 "cancerous" polyps were removed.  CT abdomen and pelvis without acute findings or bleeding source.   Eliquis on hold.  [facility] consulted.  Colonoscopy 9/24: Poor prep, stool in entire examined colon but no significant findings.  Will need repeat colonoscopy as outpatient with adequate bowel prep, outpatient follow-up with Dr. [name], resume Eliquis and hydrocortisone suppository x1 week.     Acute blood loss anemia: Hemoglobin is dropped from 12.2 on 9/3-9.7 today.  Likely secondary to GI bleeding.  Hemoglobin stable and even improved to 11 today.   Suprapubic burning pain: Etiology unclear.  CTA abdomen and pelvis without acute findings. Urine microscopy not indicative of UTI.  Had bowel prep for colonoscopy.  Today indicates that she has acute on chronic low back pain radiating around her trunk to lower abdomen.  Requested and follow-up MRI of the lumbar spine.   Acute on chronic low back pain: History of back surgery and spinal stimulator with [name], [facility]  Spinal stimulator had to be removed because it was displaced.  Now reports approximately 1 week of worsening pain.  Has used several doses of IV and oral opioids here.  No neurological deficits or sphincter involvement.  Requested and follow MRI of the lumbar spine for further recommendations.   [name] added Oxycodone 5/325 PRN, lidocaine patch, scheduled Robaxin and discontinued as needed Flexeril. She was also on 0.5 of Dilaudid Q4 PRN - today the patient is requesting I increase her Oxycodone from 5 to to 10 mg tabs - I have advised the patient that I cannot do this but that she should use other measure like heating pad and  re-positioning herself at home -  the narcotics pain registry reveals that she is receiving multiple scripts of pain medications from various different physicians - I recommend a f/u with her PCP in 2 wks and re-evaluation of her back pain- she may need a referral to pain management   Constipation: continue bowel regimen. I have discussed the relationship of narcotics and constipation   History of  PE/DVT: Diagnosed 5/31, thought to be provoked due to immobility from right knee pain.  May have completed the required duration of anticoagulation and defer decision to continue or stop Eliquis to PCP during outpatient follow-up.  Resumed Eliquis now.   Borderline/soft blood pressures: Blood pressures have normalized with   S/p recent right knee surgery: Reportedly for meniscal tear repair on 9/16 with [name] with [facility] orth[facility].  Appears stable.   Body mass index is 28.11 kg/m.       Discharge Exam: Vitals:   11/28/20 2015 11/29/20 0344  BP: 130/72 124/70  Pulse: 80 62  Resp: 18 18  Temp: 97.8 F (36.6 C) (!) 97.5 F (36.4 C)  SpO2: 94% 98%   Vitals:   11/28/20 0945 11/28/20 1010 11/28/20 2015 11/29/20 0344  BP:  139/87 130/72 124/70  Pulse: (!) 59 (!) 59 80 62  Resp: _0 Temp:  98 F (36.7 C) 97.8 F (36.6 C) (!) 97.5 F (36.4 C)  TempSrc:      SpO2: 92% 99% 94% 98%  Weight:      Height:        General: Pt is alert, awake, not in acute distress Cardiovascular: RRR, S1/S2 +, no rubs, no gallops Respiratory: CTA bilaterally, no wheezing, no rhonchi Abdominal: Soft, NT, ND, bowel sounds + Extremities: no edema, no cyanosis   Discharge Instructions  Discharge Instructions     Diet - low sodium heart healthy   Complete by: As directed    Increase activity slowly   Complete by: As directed    No wound care   Complete by: As directed       Allergies as of 11/29/2020       Reactions   Gabapentin Shortness Of Breath, Rash   Morphine Anaphylaxis, Hives, Shortness Of Breath, Rash   Tramadol Anaphylaxis, Swelling, Rash   headache   Hydrocodone-ibuprofen Hives, Swelling        Medication List     STOP taking these medications    oxycodone 5 MG capsule Commonly known as: OXY-IR       TAKE these medications    cyclobenzaprine 10 MG tablet Commonly known as: FLEXERIL Take 1 tablet (10 mg total) by mouth 2 (two) times daily as  needed for muscle spasms.   diclofenac Sodium 1 % Gel Commonly known as: VOLTAREN Apply 2 g topically 4 (four) times daily as needed (right knee pain).   dicyclomine 20 MG tablet Commonly known as: BENTYL Take 1 tablet (20 mg total) by mouth 3 (three) times daily before meals.   Dulcolax 5 MG EC tablet Generic drug: bisacodyl Take 1 tablet (5 mg total) by mouth daily as needed for moderate constipation.   Eliquis 5 MG Tabs tablet Generic drug: apixaban Take 2 tablets (10 mg total) by mouth 2 (two) times daily for 6 days, THEN 1 tablet (5 mg total) 2 (two) times daily for 24 days. Start taking on: August 09, 2020   Eliquis 5 MG Tabs tablet Generic drug: apixaban Take 5 mg by mouth 2 (two) times daily.  escitalopram 10 MG tablet Commonly known as: LEXAPRO Take 15 mg by mouth daily.   hydrocortisone cream 1 % Apply topically 3 (three) times daily.   magnesium oxide 400 (240 Mg) MG tablet Commonly known as: MAG-OX Take 1 tablet (400 mg total) by mouth 2 (two) times daily as needed (leg cramp/spasms).   methocarbamol 500 MG tablet Commonly known as: ROBAXIN Take 1 tablet (500 mg total) by mouth every 8 (eight) hours as needed for muscle spasms.   naloxone 4 MG/0.1ML Liqd nasal spray kit Commonly known as: NARCAN Place 4 mg into the nose as needed (overdose).   Percocet 5-325 MG tablet Generic drug: oxyCODONE-acetaminophen Take 1 tablet by mouth every 8 (eight) hours as needed for severe pain. What changed: reasons to take this   polyethylene glycol 17 g packet Commonly known as: MIRALAX / GLYCOLAX Take 17 g by mouth daily. Can take up to twice a day for constipation.   SYSTANE CONTACTS OP Place 1 drop into both eyes daily as needed (dry eyes).   traZODone 50 MG tablet Commonly known as: DESYREL Take 50-100 mg by mouth at bedtime.        Allergies  Allergen Reactions   Gabapentin Shortness Of Breath and Rash   Morphine Anaphylaxis, Hives, Shortness Of Breath  and Rash   Tramadol Anaphylaxis, Swelling and Rash    headache   Hydrocodone-Ibuprofen Hives and Swelling      MR LUMBAR SPINE WO CONTRAST  Result Date: 11/28/2020 CLINICAL DATA:  Low back pain, prior surgery, new symptoms APPROX. 1 week h/o mid low back pain, ? radiation around trunk to lower abdomen. EXAM: MRI LUMBAR SPINE WITHOUT CONTRAST TECHNIQUE: Multiplanar, multisequence MR imaging of the lumbar spine was performed. No intravenous contrast was administered. COMPARISON:  X-ray 10/02/2020, MRI 06/14/2016 FINDINGS: Segmentation:  Standard. Alignment:  Slight lumbar levocurvature.  No listhesis. Vertebrae: No fracture, evidence of discitis, or suspicious bone lesion. Scattered benign intraosseous hemangiomas. Conus medullaris and cauda equina: Conus extends to the L1 level. Conus and cauda equina appear normal. Paraspinal and other soft tissues: Negative. Disc levels: T12-L1: Mild facet arthropathy. Unremarkable disc. No foraminal or canal stenosis. Unchanged. L1-L2: Mild facet arthropathy. Minimal annular disc bulge. No foraminal or canal stenosis. Unchanged. L2-L3: Mild facet arthropathy. Mild annular disc bulge with small left foraminal protrusion. No foraminal or canal stenosis. Unchanged. L3-L4: Mild facet arthropathy. Mild diffuse disc bulge. Mild bilateral subarticular recess stenosis without canal stenosis. Borderline-mild left foraminal stenosis. Unchanged. L4-L5: Postoperative changes to the lamina. Bilateral facet arthropathy, left worse than right. Chronic disc height loss with small left foraminal and far lateral disc protrusion. No foraminal or canal stenosis. Unchanged. L5-S1: Postoperative changes to the lamina. Moderate left worse than right facet arthropathy. Mild diffuse disc bulge with right paracentral posterior annular fissure. Mild-to-moderate left and mild right foraminal stenosis. No canal stenosis. Unchanged. IMPRESSION: 1. Mild multilevel lumbar spondylosis, as above. No  significant interval progression from prior MRI of 2018. 2. No canal stenosis at any level. 3. Mild-to-moderate left and mild right foraminal stenosis at L5-S1. 4. Mild bilateral subarticular recess stenosis and borderline-mild left foraminal stenosis at L3-L4. Electronically Signed   By: Davina Poke D.O.   On: 11/28/2020 14:03   CT Angio Abd/Pel W and/or Wo Contrast  Result Date: 11/25/2020 CLINICAL DATA:  Bright red blood per rectum.  GI bleed. EXAM: CTA ABDOMEN AND PELVIS WITHOUT AND WITH CONTRAST TECHNIQUE: Multidetector CT imaging of the abdomen and pelvis was performed using the standard  protocol during bolus administration of intravenous contrast. Multiplanar reconstructed images and MIPs were obtained and reviewed to evaluate the vascular anatomy. CONTRAST:  156m OMNIPAQUE IOHEXOL 350 MG/ML SOLN COMPARISON:  CT dated 07/22/2020. FINDINGS: VASCULAR Aorta: Normal caliber aorta without aneurysm, dissection, vasculitis or significant stenosis. Celiac: Patent without evidence of aneurysm, dissection, vasculitis or significant stenosis. SMA: Patent without evidence of aneurysm, dissection, vasculitis or significant stenosis. Renals: Both renal arteries are patent without evidence of aneurysm, dissection, vasculitis, fibromuscular dysplasia or significant stenosis. IMA: Patent without evidence of aneurysm, dissection, vasculitis or significant stenosis. Inflow: Patent without evidence of aneurysm, dissection, vasculitis or significant stenosis. Proximal Outflow: Bilateral common femoral and visualized portions of the superficial and profunda femoral arteries are patent without evidence of aneurysm, dissection, vasculitis or significant stenosis. Veins: The IVC is unremarkable.  No portal venous gas. Review of the MIP images confirms the above findings. NON-VASCULAR Lower chest: Minimal bibasilar linear atelectasis/scarring. The visualized lung bases are otherwise clear. No intra-abdominal free air or free  fluid. Hepatobiliary: The liver is unremarkable. No intrahepatic biliary dilatation. Layering sludge within the gallbladder. No pericholecystic fluid. Pancreas: Unremarkable. No pancreatic ductal dilatation or surrounding inflammatory changes. Spleen: Normal in size without focal abnormality. Adrenals/Urinary Tract: The adrenal glands are unremarkable. The kidneys, visualized ureters, and urinary bladder appear unremarkable. Stomach/Bowel: There is moderate stool throughout the colon. There is no bowel obstruction or active inflammation. No extravasated contrast or evidence of active GI bleed. The appendix is normal. Lymphatic: No adenopathy. Reproductive: The uterus and ovaries are grossly unremarkable. Other: None Musculoskeletal: No acute or significant osseous findings. IMPRESSION: 1. No acute intra-abdominal or pelvic pathology. No evidence of active GI bleed. 2. Moderate colonic stool burden. No bowel obstruction. Normal appendix. 3. Gallbladder sludge. Electronically Signed   By: AAnner CreteM.D.   On: 11/25/2020 22:39     The results of significant diagnostics from this hospitalization (including imaging, microbiology, ancillary and laboratory) are listed below for reference.     Microbiology: Recent Results (from the past 240 hour(s))  Resp Panel by RT-PCR (Flu A&B, Covid) Nasopharyngeal Swab     Status: None   Collection Time: 11/26/20 12:31 AM   Specimen: Nasopharyngeal Swab; Nasopharyngeal(NP) swabs in vial transport medium  Result Value Ref Range Status   SARS Coronavirus 2 by RT PCR NEGATIVE NEGATIVE Final    Comment: (NOTE) SARS-CoV-2 target nucleic acids are NOT DETECTED.  The SARS-CoV-2 RNA is generally detectable in upper respiratory specimens during the acute phase of infection. The lowest concentration of SARS-CoV-2 viral copies this assay can detect is 138 copies/mL. A negative result does not preclude SARS-Cov-2 infection and should not be used as the sole basis for  treatment or other patient management decisions. A negative result may occur with  improper specimen collection/handling, submission of specimen other than nasopharyngeal swab, presence of viral mutation(s) within the areas targeted by this assay, and inadequate number of viral copies(<138 copies/mL). A negative result must be combined with clinical observations, patient history, and epidemiological information. The expected result is Negative.  Fact Sheet for Patients:  hEntrepreneurPulse.com.au Fact Sheet for Healthcare Providers:  hIncredibleEmployment.be This test is no t yet approved or cleared by the [place] and  has been authorized for detection and/or diagnosis of SARS-CoV-2 by FDA under an Emergency Use Authorization (EUA). This EUA will remain  in effect (meaning this test can be used) for the duration of the COVID-19 declaration under Section 564(b)(1) of the Act, 21 U.S.C.section  360bbb-3(b)(1), unless the authorization is terminated  or revoked sooner.       Influenza A by PCR NEGATIVE NEGATIVE Final   Influenza B by PCR NEGATIVE NEGATIVE Final    Comment: (NOTE) The Xpert Xpress SARS-CoV-2/FLU/RSV plus assay is intended as an aid in the diagnosis of influenza from Nasopharyngeal swab specimens and should not be used as a sole basis for treatment. Nasal washings and aspirates are unacceptable for Xpert Xpress SARS-CoV-2/FLU/RSV testing.  Fact Sheet for Patients: EntrepreneurPulse.com.au  Fact Sheet for Healthcare Providers: IncredibleEmployment.be  This test is not yet approved or cleared by the [place] FDA and has been authorized for detection and/or diagnosis of SARS-CoV-2 by FDA under an Emergency Use Authorization (EUA). This EUA will remain in effect (meaning this test can be used) for the duration of the COVID-19 declaration under Section 564(b)(1) of the Act, 21  U.S.C. section 360bbb-3(b)(1), unless the authorization is terminated or revoked.  Performed at [facility], [place] [address], [place], [place] [address]      Labs: BNP (last 3 results) No results for input(s): BNP in the last 8760 hours. Basic Metabolic Panel: Recent Labs  Lab 11/25/20 1538  NA 136  K 3.9  CL 104  CO2 25  GLUCOSE 109*  BUN 11  CREATININE 0.81  CALCIUM 9.4   Liver Function Tests: Recent Labs  Lab 11/25/20 1538  AST 21  ALT 14  ALKPHOS 53  BILITOT 0.5  PROT 7.6  ALBUMIN 4.1   Recent Labs  Lab 11/25/20 1538  LIPASE 23   No results for input(s): AMMONIA in the last 168 hours. CBC: Recent Labs  Lab 11/25/20 1538 11/26/20 0459 11/26/20 1033 11/27/20 0442 11/28/20 0456  WBC 7.1 6.0 6.0 5.9 7.8  NEUTROABS 4.8  --   --   --   --   HGB 11.3* 9.4* 9.7* 9.7* 11.1*  HCT 35.5* 30.3* 31.0* 31.2* 36.8  MCV 84.3 85.1 84.9 86.2 87.4  PLT 339 266 291 273 387   Cardiac Enzymes: No results for input(s): CKTOTAL, CKMB, CKMBINDEX, TROPONINI in the last 168 hours. BNP: Invalid input(s): POCBNP CBG: No results for input(s): GLUCAP in the last 168 hours. D-Dimer No results for input(s): DDIMER in the last 72 hours. Hgb A1c No results for input(s): HGBA1C in the last 72 hours. Lipid Profile No results for input(s): CHOL, HDL, LDLCALC, TRIG, CHOLHDL, LDLDIRECT in the last 72 hours. Thyroid function studies No results for input(s): TSH, T4TOTAL, T3FREE, THYROIDAB in the last 72 hours.  Invalid input(s): FREET3 Anemia work up No results for input(s): VITAMINB12, FOLATE, FERRITIN, TIBC, IRON, RETICCTPCT in the last 72 hours. Urinalysis    Component Value Date/Time   COLORURINE YELLOW 11/26/2020 0902   APPEARANCEUR CLEAR 11/26/2020 0902   LABSPEC 1.025 11/26/2020 0902   PHURINE 5.5 11/26/2020 0902   GLUCOSEU NEGATIVE 11/26/2020 0902   HGBUR TRACE (A) 11/26/2020 0902   BILIRUBINUR NEGATIVE 11/26/2020 0902   KETONESUR NEGATIVE  11/26/2020 0902   PROTEINUR NEGATIVE 11/26/2020 0902   NITRITE NEGATIVE 11/26/2020 0902   LEUKOCYTESUR NEGATIVE 11/26/2020 0902   Sepsis Labs Invalid input(s): PROCALCITONIN,  WBC,  LACTICIDVEN Microbiology Recent Results (from the past 240 hour(s))  Resp Panel by RT-PCR (Flu A&B, Covid) Nasopharyngeal Swab     Status: None   Collection Time: 11/26/20 12:31 AM   Specimen: Nasopharyngeal Swab; Nasopharyngeal(NP) swabs in vial transport medium  Result Value Ref Range Status   SARS Coronavirus 2 by RT PCR NEGATIVE NEGATIVE Final  Comment: (NOTE) SARS-CoV-2 target nucleic acids are NOT DETECTED.  The SARS-CoV-2 RNA is generally detectable in upper respiratory specimens during the acute phase of infection. The lowest concentration of SARS-CoV-2 viral copies this assay can detect is 138 copies/mL. A negative result does not preclude SARS-Cov-2 infection and should not be used as the sole basis for treatment or other patient management decisions. A negative result may occur with  improper specimen collection/handling, submission of specimen other than nasopharyngeal swab, presence of viral mutation(s) within the areas targeted by this assay, and inadequate number of viral copies(<138 copies/mL). A negative result must be combined with clinical observations, patient history, and epidemiological information. The expected result is Negative.  Fact Sheet for Patients:  EntrepreneurPulse.com.au  Fact Sheet for Healthcare Providers:  IncredibleEmployment.be  This test is no t yet approved or cleared by the [place] FDA and  has been authorized for detection and/or diagnosis of SARS-CoV-2 by FDA under an Emergency Use Authorization (EUA). This EUA will remain  in effect (meaning this test can be used) for the duration of the COVID-19 declaration under Section 564(b)(1) of the Act, 21 U.S.C.section 360bbb-3(b)(1), unless the authorization is  terminated  or revoked sooner.       Influenza A by PCR NEGATIVE NEGATIVE Final   Influenza B by PCR NEGATIVE NEGATIVE Final    Comment: (NOTE) The Xpert Xpress SARS-CoV-2/FLU/RSV plus assay is intended as an aid in the diagnosis of influenza from Nasopharyngeal swab specimens and should not be used as a sole basis for treatment. Nasal washings and aspirates are unacceptable for Xpert Xpress SARS-CoV-2/FLU/RSV testing.  Fact Sheet for Patients: EntrepreneurPulse.com.au  Fact Sheet for Healthcare Providers: IncredibleEmployment.be  This test is not yet approved or cleared by the [place] FDA and has been authorized for detection and/or diagnosis of SARS-CoV-2 by FDA under an Emergency Use Authorization (EUA). This EUA will remain in effect (meaning this test can be used) for the duration of the COVID-19 declaration under Section 564(b)(1) of the Act, 21 U.S.C. section 360bbb-3(b)(1), unless the authorization is terminated or revoked.  Performed at [facility], [place] [address] [place], [place] [address]      Time coordinating discharge in minutes: 65  SIGNED:   Debbe Odea, MD  [facility] 11/29/2020, 3:51 PM

## 2020-11-29 NOTE — Progress Notes (Signed)
This shift, pt's 2200 dose of eliquis held d/t administration timing on previous shift. Pharmacy notified.  prior to this shift pt had loose BM with minimal blood in stool (pt has pic of it in phone)no BM this shift.

## 2020-11-29 NOTE — Progress Notes (Signed)
Nursing Discharge Note   Admit Date: 11/25/2020   Discharge date: 11/29/2020   Brittney Carter is  to be discharged home per MD order.  AVS completed. Reviewed with patient. Copy of AVS provided for patient to take home.  Patient able to verbalize understanding of discharge instructions. PIV removed. Patient stable upon discharge.   Discharge Instructions   None    Allergies as of 11/29/2020       Reactions   Gabapentin Shortness Of Breath, Rash   Morphine Anaphylaxis, Hives, Shortness Of Breath, Rash   Tramadol Anaphylaxis, Swelling, Rash   headache   Hydrocodone-ibuprofen Hives, Swelling        Medication List     STOP taking these medications    oxycodone 5 MG capsule Commonly known as: OXY-IR       TAKE these medications    cyclobenzaprine 10 MG tablet Commonly known as: FLEXERIL Take 1 tablet (10 mg total) by mouth 2 (two) times daily as needed for muscle spasms.   diclofenac Sodium 1 % Gel Commonly known as: VOLTAREN Apply 2 g topically 4 (four) times daily as needed (right knee pain).   dicyclomine 20 MG tablet Commonly known as: BENTYL Take 1 tablet (20 mg total) by mouth 3 (three) times daily before meals.   Dulcolax 5 MG EC tablet Generic drug: bisacodyl Take 1 tablet (5 mg total) by mouth daily as needed for moderate constipation.   Eliquis 5 MG Tabs tablet Generic drug: apixaban Take 2 tablets (10 mg total) by mouth 2 (two) times daily for 6 days, THEN 1 tablet (5 mg total) 2 (two) times daily for 24 days. Start taking on: August 09, 2020   Eliquis 5 MG Tabs tablet Generic drug: apixaban Take 5 mg by mouth 2 (two) times daily.   escitalopram 10 MG tablet Commonly known as: LEXAPRO Take 15 mg by mouth daily.   hydrocortisone cream 1 % Apply topically 3 (three) times daily.   magnesium oxide 400 (240 Mg) MG tablet Commonly known as: MAG-OX Take 1 tablet (400 mg total) by mouth 2 (two) times daily as needed (leg cramp/spasms).    methocarbamol 500 MG tablet Commonly known as: ROBAXIN Take 1 tablet (500 mg total) by mouth every 8 (eight) hours as needed for muscle spasms.   naloxone 4 MG/0.1ML Liqd nasal spray kit Commonly known as: NARCAN Place 4 mg into the nose as needed (overdose).   Percocet 5-325 MG tablet Generic drug: oxyCODONE-acetaminophen Take 1 tablet by mouth every 8 (eight) hours as needed for severe pain. What changed: reasons to take this   polyethylene glycol 17 g packet Commonly known as: MIRALAX / GLYCOLAX Take 17 g by mouth daily. Can take up to twice a day for constipation.   SYSTANE CONTACTS OP Place 1 drop into both eyes daily as needed (dry eyes).   traZODone 50 MG tablet Commonly known as: DESYREL Take 50-100 mg by mouth at bedtime.       Discharge Instructions/ Education: Discharge instructions given to patient/family with verbalized understanding. Discharge education completed with patient/family including: follow up instructions, medication list, discharge activities, and limitations if indicated.  Patient escorted via wheelchair to lobby and discharged home via private automobile.

## 2020-11-30 ENCOUNTER — Other Ambulatory Visit: Payer: Self-pay

## 2020-11-30 ENCOUNTER — Telehealth: Payer: Self-pay

## 2020-11-30 DIAGNOSIS — Z1211 Encounter for screening for malignant neoplasm of colon: Secondary | ICD-10-CM

## 2020-11-30 NOTE — Telephone Encounter (Signed)
Pt was scheduled for hospital Colon on 01/25/2021 @ 10:45 with Dr. [name]. Pt made aware. Pt was reminded of appointment already scheduled for 12/16/2020 @ 10:00 with Dr. [name] at the [facility] office. Instructions along with PREP to be given at office visit.  Pt verbalized understanding with all questions answered.

## 2020-11-30 NOTE — Telephone Encounter (Signed)
-----   Message from Marlon Pel, RN sent at 11/30/2020 10:31 AM EDT -----  ----- Message ----- From: Mauri Pole, MD Sent: 11/28/2020   9:34 AM EDT To: Marlon Pel, RN, Jackquline Denmark, MD  Patient needs hospital discharge follow up and colonoscopy with Dr.[name], I attempted colonoscopy this morning , had to abort due to poor prep.  Thanks VN

## 2020-12-01 ENCOUNTER — Other Ambulatory Visit: Payer: Medicare Other | Admitting: *Deleted

## 2020-12-01 ENCOUNTER — Encounter: Payer: Self-pay | Admitting: *Deleted

## 2020-12-01 NOTE — Patient Outreach (Signed)
Brittney Carter) Care Management  12/01/2020  Brittney Carter March 04, 1961 [id number]   Discharged 9/25 Transition of care to be completed by provider  Telephone Assessment-Unsuccessful  RN attempted outreach call however unsuccessful. RN able to leave a HIPAA approved voice message requesting a call back.  Will attempted another outreach over the next week for follow up on pt's ongoing management of care.  [name], RN Care Management Coordinator [place] Office [phone]

## 2020-12-02 ENCOUNTER — Telehealth: Payer: Self-pay | Admitting: Gastroenterology

## 2020-12-02 ENCOUNTER — Emergency Department (HOSPITAL_COMMUNITY)
Admission: EM | Admit: 2020-12-02 | Discharge: 2020-12-02 | Disposition: A | Discharge status: Home / Self Care | Payer: Medicare Other | Attending: Emergency Medicine | Admitting: Emergency Medicine

## 2020-12-02 ENCOUNTER — Other Ambulatory Visit: Payer: Self-pay

## 2020-12-02 ENCOUNTER — Encounter (HOSPITAL_COMMUNITY): Payer: Self-pay | Admitting: *Deleted

## 2020-12-02 DIAGNOSIS — R109 Unspecified abdominal pain: Secondary | ICD-10-CM | POA: Diagnosis present

## 2020-12-02 DIAGNOSIS — R1084 Generalized abdominal pain: Secondary | ICD-10-CM

## 2020-12-02 DIAGNOSIS — K922 Gastrointestinal hemorrhage, unspecified: Secondary | ICD-10-CM | POA: Diagnosis not present

## 2020-12-02 DIAGNOSIS — K921 Melena: Secondary | ICD-10-CM

## 2020-12-02 DIAGNOSIS — Z7901 Long term (current) use of anticoagulants: Secondary | ICD-10-CM | POA: Diagnosis not present

## 2020-12-02 LAB — URINALYSIS, ROUTINE W REFLEX MICROSCOPIC
Bilirubin Urine: NEGATIVE
Glucose, UA: NEGATIVE mg/dL
Hgb urine dipstick: NEGATIVE
Ketones, ur: 40 mg/dL — AB
Leukocytes,Ua: NEGATIVE
Nitrite: NEGATIVE
Protein, ur: NEGATIVE mg/dL
Specific Gravity, Urine: 1.025 (ref 1.005–1.030)
pH: 6 (ref 5.0–8.0)

## 2020-12-02 LAB — COMPREHENSIVE METABOLIC PANEL
ALT: 13 U/L (ref 0–44)
AST: 18 U/L (ref 15–41)
Albumin: 4.4 g/dL (ref 3.5–5.0)
Alkaline Phosphatase: 59 U/L (ref 38–126)
Anion gap: 7 (ref 5–15)
BUN: 15 mg/dL (ref 6–20)
CO2: 26 mmol/L (ref 22–32)
Calcium: 10.1 mg/dL (ref 8.9–10.3)
Chloride: 104 mmol/L (ref 98–111)
Creatinine, Ser: 0.72 mg/dL (ref 0.44–1.00)
GFR, Estimated: >60 mL/min (ref >60)
Glucose, Bld: 102 mg/dL — ABNORMAL HIGH (ref 70–99)
Potassium: 3.8 mmol/L (ref 3.5–5.1)
Sodium: 137 mmol/L (ref 135–145)
Total Bilirubin: 0.5 mg/dL (ref 0.3–1.2)
Total Protein: 8.2 g/dL — ABNORMAL HIGH (ref 6.5–8.1)

## 2020-12-02 LAB — TYPE AND SCREEN
ABO/RH(D): A POS
Antibody Screen: NEGATIVE

## 2020-12-02 LAB — CBC
HCT: 38.1 % (ref 36.0–46.0)
Hemoglobin: 11.9 g/dL — ABNORMAL LOW (ref 12.0–15.0)
MCH: 26.2 pg (ref 26.0–34.0)
MCHC: 31.2 g/dL (ref 30.0–36.0)
MCV: 83.9 fL (ref 80.0–100.0)
Platelets: 425 10*3/uL — ABNORMAL HIGH (ref 150–400)
RBC: 4.54 MIL/uL (ref 3.87–5.11)
RDW: 16.2 % — ABNORMAL HIGH (ref 11.5–15.5)
WBC: 6.5 10*3/uL (ref 4.0–10.5)
nRBC: 0 % (ref 0.0–0.2)

## 2020-12-02 LAB — POC OCCULT BLOOD, ED: Fecal Occult Bld: NEGATIVE

## 2020-12-02 LAB — LIPASE, BLOOD: Lipase: 29 U/L (ref 11–51)

## 2020-12-02 MED ORDER — HYDROMORPHONE HCL 1 MG/ML IJ SOLN
0.5000 mg | Freq: Once | INTRAMUSCULAR | Status: AC
  Administered 2020-12-02: 0.5 mg via INTRAVENOUS
  Filled 2020-12-02: qty 1

## 2020-12-02 MED ORDER — ONDANSETRON 4 MG PO TBDP
4.0000 mg | ORAL_TABLET | Freq: Once | ORAL | Status: AC
  Administered 2020-12-02: 4 mg via ORAL
  Filled 2020-12-02: qty 1

## 2020-12-02 MED ORDER — HYDROMORPHONE HCL 2 MG/ML IJ SOLN
2.0000 mg | Freq: Once | INTRAMUSCULAR | Status: AC
  Administered 2020-12-02: 2 mg via INTRAMUSCULAR
  Filled 2020-12-02: qty 1

## 2020-12-02 MED ORDER — SODIUM CHLORIDE 0.9 % IV BOLUS
1000.0000 mL | Freq: Once | INTRAVENOUS | Status: AC
  Administered 2020-12-02: 1000 mL via INTRAVENOUS

## 2020-12-02 MED ORDER — ONDANSETRON HCL 4 MG/2ML IJ SOLN
4.0000 mg | Freq: Once | INTRAMUSCULAR | Status: AC
  Administered 2020-12-02: 4 mg via INTRAVENOUS
  Filled 2020-12-02 (×2): qty 2

## 2020-12-02 MED ORDER — HYDROMORPHONE HCL 1 MG/ML IJ SOLN
1.0000 mg | Freq: Once | INTRAMUSCULAR | Status: DC
  Filled 2020-12-02: qty 1

## 2020-12-02 NOTE — Telephone Encounter (Signed)
Inbound call from pt requesting a call back stating that she is having severe abd pain. Please advise. Thank you.

## 2020-12-02 NOTE — ED Provider Notes (Signed)
Emergency Medicine Provider Triage Evaluation Note  Brittney Carter , a 60 y.o. female  was evaluated in triage.  Pt complains of abd pain, rectal bleeding, hard stool.  Review of Systems  Positive: Abd pain, rectal bleeding, hard stool, chills ,fever Negative: Chest pain  Physical Exam  BP (!) 161/108 (BP Location: Right Arm)   Pulse (!) 107   Temp 98.5 F (36.9 C) (Oral)   Resp 18   SpO2 100%  Gen:   Awake, no distress   Resp:  Normal effort  MSK:   Moves extremities without difficulty  Other:    Medical Decision Making  Medically screening exam initiated at 1:43 PM.  Appropriate orders placed.  Brittney Carter was informed that the remainder of the evaluation will be completed by another provider, this initial triage assessment does not replace that evaluation, and the importance of remaining in the ED until their evaluation is complete.     Brittney Carter 12/02/20 1343    Brittney Fuse, MD 12/09/20 [phone]

## 2020-12-02 NOTE — Discharge Instructions (Addendum)
Please read and follow all provided instructions.  Your diagnoses today include:  1. Generalized abdominal pain   2. Blood in stool     Tests performed today include: Blood cell counts (white, red, and platelets) -your red blood cells continue to get higher from previous admission to the hospital Electrolytes  Kidney function test Liver function test Pancreas test (lipase) Stool test for blood - was negative tonight  Medications prescribed:  None  Take any prescribed medications only as directed.  Home care instructions:  Follow any educational materials contained in this packet.  Follow-up instructions: Please follow-up with your primary care provider in the next 2 days for further evaluation of your symptoms.   Return instructions:  Please return to the Emergency Department if you experience worsening symptoms.  Return with persistent bleeding in the stool, persistent vomiting. Please return if you have any other emergent concerns.  Additional Information:  Your vital signs today were: BP (!) 151/81   Pulse 67   Temp 98.5 F (36.9 C) (Oral)   Resp 12   SpO2 96%  If your blood pressure (BP) was elevated above 135/85 this visit, please have this repeated by your doctor within one month. --------------

## 2020-12-02 NOTE — Telephone Encounter (Signed)
Pt is currently in the ER.

## 2020-12-02 NOTE — ED Triage Notes (Signed)
Pt complains of abdominal pain, hard stool, diarrhea, rectal bleeding for the past couple days. Reports she was in hospital last week for same problem.

## 2020-12-02 NOTE — ED Provider Notes (Signed)
[facility] DEPT Provider Note   CSN: [id number] Arrival date & time: 12/02/20  1323     History Chief Complaint  Patient presents with   Abdominal Pain    Brittney Carter is a 60 y.o. female.   Patient with medical history significant for GI bleed, PE/DVT on Eliquis, chronic back pain and depression --presents to the emergency department for worsening abdominal pain and blood in the stool.  Patient was just admitted 9/22-9/25/22.  She was evaluated by gastroenterology and had a colonoscopy that was inconclusive due to it being aborted due to poor prep conditions.  Patient did have moderate sized internal and external hemorrhoids noted on that procedure.  She was continued on her Eliquis which she takes for DVT/PE.  Patient states that this morning she awoke to worsening abdominal pain and generalized bloating.  She had a hard stool that contained red blood and then shortly after had an episode of black diarrhea which turned to yellow stool.  Patient is very tearful stating "there is something wrong inside of me".  She feels as though she is going to have another 7 days of worsening bleeding however her current bleeding is not as severe as when she was admitted.  No lightheadedness, syncope, shortness of breath.  The onset of this condition was acute. Aggravating factors: none. Alleviating factors: none. Normal CT angio abdomen and pelvis on 11/25/2020.        Past Medical History:  Diagnosis Date   Arthritis    Back   Depression    no medication   Dyspnea    with exertion     Patient Active Problem List   Diagnosis Date Noted   Lower GI bleed    History of pulmonary embolus (PE) 11/26/2020   Constipation 11/26/2020   Hypotension 11/26/2020   H/O right knee surgery 11/26/2020   Hematochezia    Family history of colon cancer    Lower abdominal pain    Rectal bleeding 11/25/2020   Suicidal thoughts 09/01/2020   Acute deep vein thrombosis (DVT)  of distal vein of right lower extremity (HCC)    Acute pulmonary embolism ([place]) 08/04/2020   Back pain of thoracolumbar region 10/17/2017   Long-term current use of opiate analgesic 03/28/2017   Chronic pain 11/24/2016   Cervical radiculopathy at C7 12/03/2015   Sacroiliac joint pain 12/03/2015   Chronic low back pain 11/25/2014   Segmental and somatic dysfunction of pelvic region 10/28/2014   Disc degeneration, lumbar 08/12/2014   Lumbar radiculopathy 08/12/2014   Lumbar stenosis 08/12/2014   Osteoarthritis of spine with radiculopathy, 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 Benign neoplasm of ascending colon. Anal canal bleeding   COLONOSCOPY W/ POLYPECTOMY     x 2   ent surgery     ESOPHAGOGASTRODUODENOSCOPY  10/14/2016   Dr Orlena Sheldon. Normal EGD to second portion of duodenum   Pain stimulator trail     SPINAL CORD STIMULATOR INSERTION N/A 11/24/2016   Procedure: LUMBAR SPINAL CORD STIMULATOR INSERTION;  Surgeon: Melina Schools, MD;  Location: [facility];  Service: Orthopedics;  Laterality: N/A;  2.5 hrs   SPINAL CORD STIMULATOR REMOVAL N/A 06/28/2017   Procedure: Spinal cord stimulator battery removal;  Surgeon: Melina Schools, MD;  Location: [facility];  Service: Orthopedics;  Laterality: N/A;   TONSILLECTOMY     age 62   TUBAL LIGATION       OB History   No obstetric history on file.     No family history on file.  Social History   Tobacco Use   Smoking status: Never   Smokeless tobacco: Never  Vaping Use   Vaping Use: Never used  Substance Use Topics   Alcohol use: No   Drug use: No    Home Medications Prior to Admission medications   Medication Sig Start Date End Date Taking? Authorizing Provider  apixaban (ELIQUIS) 5 MG TABS tablet Take 2 tablets (10 mg total) by mouth 2 (two) times  daily for 6 days, THEN 1 tablet (5 mg total) 2 (two) times daily for 24 days. 08/09/20 09/08/20  Ezekiel Slocumb, DO  Artificial Tear Solution (SYSTANE CONTACTS OP) Place 1 drop into both eyes daily as needed (dry eyes).    [provider]  bisacodyl (DULCOLAX) 5 MG EC tablet Take 1 tablet (5 mg total) by mouth daily as needed for moderate constipation. 11/29/20 11/29/21  Debbe Odea, MD  cyclobenzaprine (FLEXERIL) 10 MG tablet Take 1 tablet (10 mg total) by mouth 2 (two) times daily as needed for muscle spasms. Patient not taking: Reported on 11/25/2020 09/01/20   Gareth Morgan, MD  diclofenac Sodium (VOLTAREN) 1 % GEL Apply 2 g topically 4 (four) times daily as needed (right knee pain).    [provider]  dicyclomine (BENTYL) 20 MG tablet Take 1 tablet (20 mg total) by mouth 3 (three) times daily before meals. 11/29/20   Debbe Odea, MD  ELIQUIS 5 MG TABS tablet Take 5 mg by mouth 2 (two) times daily. 09/29/20   [provider]  escitalopram (LEXAPRO) 10 MG tablet Take 15 mg by mouth daily. 06/30/20   [provider]  hydrocortisone cream 1 % Apply topically 3 (three) times daily. Patient not taking: Reported on 11/25/2020 08/09/20   Nicole Kindred A, DO  magnesium oxide (MAG-OX) 400 (240 Mg) MG tablet Take 1 tablet (400 mg total) by mouth 2 (two) times daily as needed (leg cramp/spasms). 08/09/20   Ezekiel Slocumb, DO  methocarbamol (ROBAXIN) 500 MG tablet Take 1 tablet (500 mg total) by mouth every 8 (eight) hours as needed for muscle spasms. 11/29/20   Debbe Odea, MD  naloxone [facility]) nasal spray 4 mg/0.1 mL Place 4 mg into the nose as needed (overdose). 04/04/19   [provider]  PERCOCET 5-325 MG tablet Take 1 tablet by mouth every 8 (eight) hours as needed for severe pain. 11/29/20   Debbe Odea, MD  polyethylene glycol (MIRALAX / GLYCOLAX) 17 g packet Take 17 g by mouth daily. Can take up to twice a day for constipation. 11/29/20   Debbe Odea,  MD  traZODone (DESYREL) 50 MG tablet Take 50-100 mg by mouth at bedtime. 07/03/20   [provider]    Allergies    Gabapentin, Morphine, Tramadol, and Hydrocodone-ibuprofen  Review of Systems   Review of Systems  Constitutional:  Negative for fever.  HENT:  Negative for rhinorrhea and sore throat.   Eyes:  Negative for redness.  Respiratory:  Negative for cough.   Cardiovascular:  Negative for chest pain.  Gastrointestinal:  Positive for abdominal pain, blood in stool and nausea. Negative for constipation, diarrhea and vomiting.  Genitourinary:  Negative for dysuria, frequency, hematuria and urgency.  Musculoskeletal:  Negative for myalgias.  Skin:  Negative for rash.  Neurological:  Negative for headaches.   Physical Exam Updated Vital Signs BP (!) 159/93   Pulse 80   Temp 98.5 F (36.9 C) (Oral)   Resp 16   SpO2 100%   Physical Exam Vitals and nursing note reviewed.  Constitutional:      General: She is in acute distress (tearful).     Appearance: She is well-developed.  HENT:     Head: Normocephalic and atraumatic.     Right Ear: External ear normal.     Left Ear: External ear normal.     Nose: Nose normal.  Eyes:     Conjunctiva/sclera: Conjunctivae normal.  Cardiovascular:     Rate and Rhythm: Normal rate and regular rhythm.     Heart sounds: No murmur heard. Pulmonary:     Effort: No respiratory distress.     Breath sounds: No wheezing, rhonchi or rales.  Abdominal:     Palpations: Abdomen is soft.     Tenderness: There is generalized abdominal tenderness. There is no guarding or rebound. Negative signs include Murphy's sign.  Musculoskeletal:     Cervical back: Normal range of motion and neck supple.     Right lower leg: No edema.     Left lower leg: No edema.  Skin:    General: Skin is warm and dry.     Findings: No rash.  Neurological:     General: No focal deficit present.     Mental Status: She is alert. Mental status is at baseline.      Motor: No weakness.  Psychiatric:        Mood and Affect: Mood normal.    ED Results / Procedures / Treatments   Labs (all labs ordered are listed, but only abnormal results are displayed) Labs Reviewed  COMPREHENSIVE METABOLIC PANEL - Abnormal; Notable for the following components:      Result Value   Glucose, Bld 102 (*)    Total Protein 8.2 (*)    All other components within normal limits  CBC - Abnormal; Notable for the following components:   Hemoglobin 11.9 (*)    RDW 16.2 (*)    Platelets 425 (*)    All other components within normal limits  LIPASE, BLOOD  URINALYSIS, ROUTINE W REFLEX MICROSCOPIC  TYPE AND SCREEN    EKG None  Radiology No results found.  Procedures Procedures   Medications Ordered in ED Medications - No data to display  ED Course  I have reviewed the triage vital signs and the nursing notes.  Pertinent labs & imaging results that were available during my care of the patient were reviewed by me and considered in my medical decision making (see chart for details).  Patient seen and examined. Work-up initiated. Medications ordered. Reviewed previous hospitalization notes, GI notes and procedures.   Vital signs reviewed and are as follows: BP (!) 159/93   Pulse 80   Temp 98.5 F (36.9 C) (Oral)   Resp 16   SpO2 100%   5:44 PM lab work is reassuring to this point.  RN asked if she could give medications IM due to poor IV access.  Order change.  Will reassess.  8:39 PM Patient stable during ED stay.  IV was placed.  Patient received bolus of IV fluids and additional dose of pain medication.  Hemoccult was negative.  Patient requesting additional pain medications for home stating that her other medications do not work.  She states that  it causes her abdominal pain when she take 2 of them due to doubling up on Tylenol.  I declined additional prescription due to recent previous prescription.  Strongly encouraged PCP follow-up although she states  that it is difficult to get in with her PCP.  This is for recheck of symptoms, discussion of pain control, and discussion of whether or not she needs to continue anticoagulation.  Plan for discharge to home. The patient was urged to return to the Emergency Department immediately with worsening of current symptoms, worsening abdominal pain, persistent vomiting, persistent passage of blood noted in stools, fever, or any other concerns. The patient verbalized understanding.      MDM Rules/Calculators/A&P                           Patient here with ongoing abdominal pain and concern for bleeding in the stool.  Patient with recent admission and extensive work-up.  CT angiography of the abdomen was negative a week ago.  Patient had a colonoscopy that could not be completed.  Tonight she has generalized abdominal pain.  She reports questionable blood in the stool earlier today.  Hemoccult negative.  Hemoglobin continues to improve from her discharge 3 days ago.  Vital signs are normal.  Do not feel that patient requires repeat imaging today.  She was given hydration and pain control.  At this time, no indication for admission to the hospital or emergent GI consultation.  No urinary symptoms, chest pain or shortness of breath.  Plan for discharged home with outpatient follow-up as planned.  Final Clinical Impression(s) / ED Diagnoses Final diagnoses:  Generalized abdominal pain  Blood in stool    Rx / DC Orders ED Discharge Orders     None        Carlisle Cater, Hershal Coria 12/02/20 2042    Blanchie Dessert, MD 12/06/20 [phone]

## 2020-12-07 ENCOUNTER — Other Ambulatory Visit: Payer: Self-pay | Admitting: *Deleted

## 2020-12-07 NOTE — Patient Outreach (Signed)
[facility]) Care Management  12/07/2020  Brittney Carter Aug 18, 1960 [id number]   RN attempted outreach call however unsuccessful. RN able to leave a HIPAA approved voice message requesting a call back.  Will also sent outreach letter and follow up within the next week for ongoing [facility] services.  [name], RN Care Management Coordinator [place] Office [phone]

## 2020-12-10 ENCOUNTER — Telehealth: Payer: Self-pay

## 2020-12-10 ENCOUNTER — Other Ambulatory Visit: Payer: Self-pay | Admitting: *Deleted

## 2020-12-10 NOTE — Telephone Encounter (Signed)
Pt was sent a Letter via My Chart with Two Day Prep Instructions for her colonoscopy that is scheduled 01/25/2021 @ 11:00 at [facility]. Pt made aware. Pt verbalized understanding with all questions answered.  Pt reminded of OV with Dr. [name] 12/16/2020 @ 10:00 at the [facility].

## 2020-12-10 NOTE — Telephone Encounter (Signed)
-----   Message from Jackquline Denmark, MD sent at 12/08/2020  7:51 PM EDT ----- Regarding: Needs 2-day prep Remo Lipps, Please make sure she gets 2-day prep before her scheduled colonoscopy 11/21 RG  ----- Message ----- From: Mauri Pole, MD Sent: 11/28/2020   9:34 AM EDT To: Marlon Pel, RN, Jackquline Denmark, MD  Patient needs hospital discharge follow up and colonoscopy with Dr.[name], I attempted colonoscopy this morning , had to abort due to poor prep.  Thanks VN

## 2020-12-10 NOTE — Patient Outreach (Signed)
[facility]) Care Management  12/10/2020  MERLIN EGE Sep 11, 1960 [id number]   Telephone Assessment-Unsuccessful  RN has made several outreach call however unsuccessful. RN able to leave a HIPAA approved voice message requesting a call back.  Will attempted another outreach over the next few weeks for ongoing [facility] services.  [name], RN Care Management Coordinator [place] Office [phone]

## 2020-12-16 ENCOUNTER — Encounter: Payer: Self-pay | Admitting: Gastroenterology

## 2020-12-16 ENCOUNTER — Ambulatory Visit (INDEPENDENT_AMBULATORY_CARE_PROVIDER_SITE_OTHER): Payer: Medicare Other | Admitting: Gastroenterology

## 2020-12-16 ENCOUNTER — Other Ambulatory Visit: Payer: Self-pay

## 2020-12-16 VITALS — BP 132/82 | HR 70 | Ht 66.0 in | Wt 184.2 lb

## 2020-12-16 DIAGNOSIS — R1310 Dysphagia, unspecified: Secondary | ICD-10-CM | POA: Diagnosis not present

## 2020-12-16 DIAGNOSIS — Z800 Family history of malignant neoplasm of digestive organs: Secondary | ICD-10-CM

## 2020-12-16 DIAGNOSIS — K219 Gastro-esophageal reflux disease without esophagitis: Secondary | ICD-10-CM | POA: Diagnosis not present

## 2020-12-16 DIAGNOSIS — K625 Hemorrhage of anus and rectum: Secondary | ICD-10-CM | POA: Diagnosis not present

## 2020-12-16 MED ORDER — OMEPRAZOLE 20 MG PO CPDR: 20.0000 mg | DELAYED_RELEASE_CAPSULE | Freq: Every day | ORAL | 0 refills | Status: DC

## 2020-12-16 NOTE — Progress Notes (Signed)
Chief Complaint: FU from hospitalization  Referring Provider:  [name], MD      ASSESSMENT AND PLAN;   #1. Rectal bleeding. D/d hoids, AVMs, colitis, polyps, stercoral ulcers etc, r/o colonic neoplasms or IBD.  #2. H/O colon polyps 10/2016. FH colon Ca (brother <60)  #3. GERD with occ dysphagia  #4. IBS-C with abdominal bloating   Plan: -EGD with dil /Colon with 2 day prep at [facility] -Omeperazole 67m po QD #30 -Need clearence to hold eliquis x 24hrs from Dr HNyra Capes-CBC and CMP at the time of procedures -Patient will also make FU appt with Dr. BMarco Collie  HPI:    Brittney BRONERis a 60y.o. female  With Fibromyalgia, chronic LBP d/t OA s/p multiple back surgeries, OA s/p recent knee surgery complicated by DVT/PE on Eliquis, anxiety/depression  Complex patient with multiple GI issues.  [facility] intermittent [facility] bleeding.  Had attempted colonoscopy which was reported d/t poor prep.  C/O Rectal bleeding 1/month, lasting for 6-7 days with clots x 2-358yr  At times mixed with the stool. Has predominant constipation with pellet-like stools.  Occasional diarrhea. Hb 11 CT Abdo/pelvis 11/2020 was negative except for moderate colonic stool.  No further bleeding x 2 weeks.  Also has been having intermittent heartburn, dysphagia mostly to solids.  Mid-chest without any odynophagia.  Has chronic generalized abdominal pain with abdominal distention. Dx with IBS.  Bentyl helps with abdominal pain.   Past GI work-up:  CT Abdo/pelvis with contrast 11/25/2020 1. No acute intra-abdominal or pelvic pathology. No evidence of active GI bleed. 2. Moderate colonic stool burden. No bowel obstruction. Normal appendix. 3. Gallbladder sludge.  Colonoscopy 10/2016 by Dr. [facility]) -Colonic polyps s/p polypectomy. Bx- TA. Rpt in 5 yrs -Source for rectal bleeding was not identified.  EGD 10/2016 Dr. BuMelina Copa -Neg   Past Medical History:  Diagnosis  Date  . Arthritis    Back  . Depression    no medication  . Dyspnea    with exertion     Past Surgical History:  Procedure Laterality Date  . BACK SURGERY  2016   Laminectomy  . BACK SURGERY  1991   Disectomy  . BREAST SURGERY     Breast lift  . COLONOSCOPY  10/14/2016   Dr BuOrlena SheldonBenign neoplasm of ascending colon. Anal canal bleeding  . COLONOSCOPY W/ POLYPECTOMY     x 2  . ent surgery    . ESOPHAGOGASTRODUODENOSCOPY  10/14/2016   Dr BuOrlena SheldonNormal EGD to second portion of duodenum  . Pain stimulator trail    . SPINAL CORD STIMULATOR INSERTION N/A 11/24/2016   Procedure: LUMBAR SPINAL CORD STIMULATOR INSERTION;  Surgeon: BrMelina SchoolsMD;  [facility];  Laterality: N/A;  2.5 hrs  . SPINAL CORD STIMULATOR REMOVAL N/A 06/28/2017   Procedure: Spinal cord stimulator battery removal;  Surgeon: BrMelina SchoolsMD;  [facility];  Laterality: N/A;  . TONSILLECTOMY     age 60. TUBAL LIGATION      Family History  Problem Relation Age of Onset  . Breast cancer Sister   . Colon cancer Brother        died at 4785. Rectal cancer Neg Hx   . Esophageal cancer Neg Hx   . Stomach cancer Neg Hx     Social History   Tobacco Use  . Smoking status: Never  .  Smokeless tobacco: Never  Vaping Use  . Vaping Use: Never used  Substance Use Topics  . Alcohol use: No  . Drug use: No    Current Outpatient Medications  Medication Sig Dispense Refill  . Artificial Tear Solution (SYSTANE CONTACTS OP) Place 1 drop into both eyes daily as needed (dry eyes).    . bisacodyl (DULCOLAX) 5 MG EC tablet Take 1 tablet (5 mg total) by mouth daily as needed for moderate constipation. 30 tablet 1  . cyclobenzaprine (FLEXERIL) 10 MG tablet Take 1 tablet (10 mg total) by mouth 2 (two) times daily as needed for muscle spasms. 20 tablet 0  . ELIQUIS 5 MG TABS tablet Take 5 mg by mouth 2 (two) times daily.    Marland Kitchen escitalopram (LEXAPRO) 10 MG  tablet Take 15 mg by mouth daily.    . hydrocortisone cream 1 % Apply topically 3 (three) times daily. 30 g 0  . methocarbamol (ROBAXIN) 500 MG tablet Take 1 tablet (500 mg total) by mouth every 8 (eight) hours as needed for muscle spasms. 60 tablet 0  . polyethylene glycol (MIRALAX / GLYCOLAX) 17 g packet Take 17 g by mouth daily. Can take up to twice a day for constipation. 30 each 3  . traZODone (DESYREL) 50 MG tablet Take 50-100 mg by mouth at bedtime.    Marland Kitchen apixaban (ELIQUIS) 5 MG TABS tablet Take 2 tablets (10 mg total) by mouth 2 (two) times daily for 6 days, THEN 1 tablet (5 mg total) 2 (two) times daily for 24 days. 72 tablet 0  . dicyclomine (BENTYL) 20 MG tablet Take 1 tablet (20 mg total) by mouth 3 (three) times daily before meals. (Patient not taking: Reported on 12/16/2020) 90 tablet 0   No current facility-administered medications for this visit.    Allergies  Allergen Reactions  . Gabapentin Shortness Of Breath and Rash  . Morphine Anaphylaxis, Hives, Shortness Of Breath and Rash  . Tramadol Anaphylaxis, Swelling and Rash    headache  . Hydrocodone-Ibuprofen Hives and Swelling    Review of Systems:  Constitutional: Denies fever, chills, diaphoresis, appetite change and has fatigue.  HEENT: Denies photophobia, eye pain, redness, hearing loss, ear pain, congestion, sore throat, rhinorrhea, sneezing, mouth sores, neck pain, neck stiffness and tinnitus.   Respiratory: Denies SOB, DOE, cough, chest tightness,  and wheezing.   Cardiovascular: Denies chest pain, palpitations and leg swelling.  Genitourinary: Denies dysuria, urgency, frequency, hematuria, flank pain and difficulty urinating.  Musculoskeletal: has myalgias, back pain, joint swelling, arthralgias and gait problem.  Skin: No rash.  Neurological: Denies dizziness, seizures, syncope, weakness, light-headedness, numbness and has headaches.  Hematological: Denies adenopathy. Easy bruising, personal or family bleeding  history  Psychiatric/Behavioral: Has anxiety or depression     Physical Exam:    BP 132/82   Pulse 70   Ht 5' 6"  (1.676 m)   Wt 184 lb 4 oz (83.6 kg)   SpO2 98%   BMI 29.74 kg/m  Wt Readings from Last 3 Encounters:  12/16/20 184 lb 4 oz (83.6 kg)  11/28/20 174 lb 2.6 oz (79 kg)  11/07/20 175 lb (79.4 kg)   Constitutional:  Well-developed, in no acute distress. Psychiatric: Normal mood and affect. Behavior is normal. HEENT: Pupils normal.  Conjunctivae are normal. No scleral icterus. Cardiovascular: Normal rate, regular rhythm. No edema Pulmonary/chest: Effort normal and breath sounds normal. No wheezing, rales or rhonchi. Abdominal: Soft, nondistended.  Generalized abdominal wall tendernes.  bowel sounds active throughout. There  are no masses palpable. No hepatomegaly. Rectal: to be Performed the time of colonoscopy. Neurological: Alert and oriented to person place and time. Skin: Skin is warm and dry. No rashes noted.  Data Reviewed: I have personally reviewed following labs and imaging studies  CBC: CBC Latest Ref Rng & Units 12/02/2020 11/28/2020 11/27/2020  WBC 4.0 - 10.5 K/uL 6.5 7.8 5.9  Hemoglobin 12.0 - 15.0 g/dL 11.9(L) 11.1(L) 9.7(L)  Hematocrit 36.0 - 46.0 % 38.1 36.8 31.2(L)  Platelets 150 - 400 K/uL 425(H) 387 273    CMP: CMP Latest Ref Rng & Units 12/02/2020 11/25/2020 11/07/2020  Glucose 70 - 99 mg/dL 102(H) 109(H) 116(H)  BUN 6 - 20 mg/dL 15 11 13   Creatinine 0.44 - 1.00 mg/dL 0.72 0.81 0.77  Sodium 135 - 145 mmol/L 137 136 142  Potassium 3.5 - 5.1 mmol/L 3.8 3.9 3.8  Chloride 98 - 111 mmol/L 104 104 110  CO2 22 - 32 mmol/L 26 25 25   Calcium 8.9 - 10.3 mg/dL 10.1 9.4 10.0  Total Protein 6.5 - 8.1 g/dL 8.2(H) 7.6 7.4  Total Bilirubin 0.3 - 1.2 mg/dL 0.5 0.5 0.6  Alkaline Phos 38 - 126 U/L 59 53 56  AST 15 - 41 U/L 18 21 17   ALT 0 - 44 U/L 13 14 15      Radiology Studies: MR LUMBAR SPINE WO CONTRAST  Result Date: 11/28/2020 CLINICAL DATA:  Low back  pain, prior surgery, new symptoms APPROX. 1 week h/o mid low back pain, ? radiation around trunk to lower abdomen. EXAM: MRI LUMBAR SPINE WITHOUT CONTRAST TECHNIQUE: Multiplanar, multisequence MR imaging of the lumbar spine was performed. No intravenous contrast was administered. COMPARISON:  X-ray 10/02/2020, MRI 06/14/2016 FINDINGS: Segmentation:  Standard. Alignment:  Slight lumbar levocurvature.  No listhesis. Vertebrae: No fracture, evidence of discitis, or suspicious bone lesion. Scattered benign intraosseous hemangiomas. Conus medullaris and cauda equina: Conus extends to the L1 level. Conus and cauda equina appear normal. Paraspinal and other soft tissues: Negative. Disc levels: T12-L1: Mild facet arthropathy. Unremarkable disc. No foraminal or canal stenosis. Unchanged. L1-L2: Mild facet arthropathy. Minimal annular disc bulge. No foraminal or canal stenosis. Unchanged. L2-L3: Mild facet arthropathy. Mild annular disc bulge with small left foraminal protrusion. No foraminal or canal stenosis. Unchanged. L3-L4: Mild facet arthropathy. Mild diffuse disc bulge. Mild bilateral subarticular recess stenosis without canal stenosis. Borderline-mild left foraminal stenosis. Unchanged. L4-L5: Postoperative changes to the lamina. Bilateral facet arthropathy, left worse than right. Chronic disc height loss with small left foraminal and far lateral disc protrusion. No foraminal or canal stenosis. Unchanged. L5-S1: Postoperative changes to the lamina. Moderate left worse than right facet arthropathy. Mild diffuse disc bulge with right paracentral posterior annular fissure. Mild-to-moderate left and mild right foraminal stenosis. No canal stenosis. Unchanged. IMPRESSION: 1. Mild multilevel lumbar spondylosis, as above. No significant interval progression from prior MRI of 2018. 2. No canal stenosis at any level. 3. Mild-to-moderate left and mild right foraminal stenosis at L5-S1. 4. Mild bilateral subarticular recess  stenosis and borderline-mild left foraminal stenosis at L3-L4. Electronically Signed   By: Davina Poke D.O.   On: 11/28/2020 14:03   CT Angio Abd/Pel W and/or Wo Contrast  Result Date: 11/25/2020 CLINICAL DATA:  Bright red blood per rectum.  GI bleed. EXAM: CTA ABDOMEN AND PELVIS WITHOUT AND WITH CONTRAST TECHNIQUE: Multidetector CT imaging of the abdomen and pelvis was performed using the standard protocol during bolus administration of intravenous contrast. Multiplanar reconstructed images and MIPs were obtained and  reviewed to evaluate the vascular anatomy. CONTRAST:  159m OMNIPAQUE IOHEXOL 350 MG/ML SOLN COMPARISON:  CT dated 07/22/2020. FINDINGS: VASCULAR Aorta: Normal caliber aorta without aneurysm, dissection, vasculitis or significant stenosis. Celiac: Patent without evidence of aneurysm, dissection, vasculitis or significant stenosis. SMA: Patent without evidence of aneurysm, dissection, vasculitis or significant stenosis. Renals: Both renal arteries are patent without evidence of aneurysm, dissection, vasculitis, fibromuscular dysplasia or significant stenosis. IMA: Patent without evidence of aneurysm, dissection, vasculitis or significant stenosis. Inflow: Patent without evidence of aneurysm, dissection, vasculitis or significant stenosis. Proximal Outflow: Bilateral common femoral and visualized portions of the superficial and profunda femoral arteries are patent without evidence of aneurysm, dissection, vasculitis or significant stenosis. Veins: The IVC is unremarkable.  No portal venous gas. Review of the MIP images confirms the above findings. NON-VASCULAR Lower chest: Minimal bibasilar linear atelectasis/scarring. The visualized lung bases are otherwise clear. No intra-abdominal free air or free fluid. Hepatobiliary: The liver is unremarkable. No intrahepatic biliary dilatation. Layering sludge within the gallbladder. No pericholecystic fluid. Pancreas: Unremarkable. No pancreatic ductal  dilatation or surrounding inflammatory changes. Spleen: Normal in size without focal abnormality. Adrenals/Urinary Tract: The adrenal glands are unremarkable. The kidneys, visualized ureters, and urinary bladder appear unremarkable. Stomach/Bowel: There is moderate stool throughout the colon. There is no bowel obstruction or active inflammation. No extravasated contrast or evidence of active GI bleed. The appendix is normal. Lymphatic: No adenopathy. Reproductive: The uterus and ovaries are grossly unremarkable. Other: None Musculoskeletal: No acute or significant osseous findings. IMPRESSION: 1. No acute intra-abdominal or pelvic pathology. No evidence of active GI bleed. 2. Moderate colonic stool burden. No bowel obstruction. Normal appendix. 3. Gallbladder sludge. Electronically Signed   By: AAnner CreteM.D.   On: 11/25/2020 22:39    Extensive notes were reviewed from Dr. BCarmie Endoffice as well.  RCarmell Austria MD 12/16/2020, 10:41 AM  Cc: HMarco Collie MD

## 2020-12-16 NOTE — Patient Instructions (Addendum)
If you are age 60 or older, your body mass index should be between 23-30. Your Body mass index is 29.74 kg/m. If this is out of the aforementioned range listed, please consider follow up with your Primary Care Provider.  If you are age 62 or younger, your body mass index should be between 19-25. Your Body mass index is 29.74 kg/m. If this is out of the aformentioned range listed, please consider follow up with your Primary Care Provider.   __________________________________________________________  The [place] [facility] providers would like to encourage you to use [facility] to communicate with providers for non-urgent requests or questions.  Due to long hold times on the telephone, sending your provider a message by [facility] may be a faster and more efficient way to get a response.  Please allow 48 business hours for a response.  Please remember that this is for non-urgent requests.   You have been scheduled for an endoscopy and colonoscopy. Please follow the written instructions given to you at your visit today. Please pick up your prep supplies at the pharmacy within the next 1-3 days. If you use inhalers (even only as needed), please bring them with you on the day of your procedure.  Please remind hospital you need a CBC with Diff and CMP the day of your procedure  You will be contacted by our office prior to your procedure for directions on holding your Eliquis.  If you do not hear from our office 1 week prior to your scheduled procedure, please call [phone] to discuss.   We have sent the following medications to your pharmacy for you to pick up at your convenience: Omeprazole  Please call with any questions or concerns.  Thank you,  Dr. Jackquline Denmark

## 2020-12-24 ENCOUNTER — Other Ambulatory Visit: Payer: Self-pay | Admitting: *Deleted

## 2020-12-24 NOTE — Patient Outreach (Signed)
[facility]) Care Management  12/24/2020  LAMA NARAYANAN 1960-06-13 [id number]   Telephone Assessment-RE: call back Received a STAT referral from primary provider today on this member.  RN contacted the provider and left a HIPAA approved voice message requesting a call back due to the unsuccessful calls over the course of several month with this pt with the available contacts in [place]. Requested provider's office nurse return a call to this RN case manager concerning update on this calls.  RN also attempted another outreach call to this member today. Briefly spoke with pt however pt indicated she recent left the orthopedic office and requested a call back in the AM.  RN will follow up once again in the AM to services this patient once again for [facility] services and inquire further on her possible needs and all follow up calls recommended by the previous [facility] for pt to call "[facility]" and her involvement with [facility] as pt was previously awaiting a call back.  [name], RN Care Management Coordinator [place] Office [phone]

## 2020-12-25 ENCOUNTER — Other Ambulatory Visit: Payer: Self-pay | Admitting: *Deleted

## 2020-12-25 ENCOUNTER — Encounter: Payer: Self-pay | Admitting: *Deleted

## 2020-12-25 NOTE — Patient Outreach (Signed)
[facility]) Care Management  12/25/2020  Brittney Carter 04-25-1960 [id number]   Telephone Assessment-Successful-OTHER   Spoke with pt in detail today with updated plan of care and events that have occurred over the past few months. The following issues have been resolved and noted within the care plan. Pt reports the following:  Shelter-Pt obtained a DSS voucher and is working with her landlord on staying at her current apartment. Pt pick up her voucher on yesterday for her rent. Counseling-previously discussed recommendations from the [facility] involved during the summer for [place] (pt has not called), [facility] (pt reports doesn't qualify) and she wants a Social worker. Aware [facility] does not provide this services only LCSW counseling until her scheduled appointment takes place with a longer term counselor or psychiatric or psychologist. Pt verbalized an understanding and currently refused counseling telephonically with [facility] LCSW.   Updated all information and continue to offer monthly care management services however pt requested the quarterly follow up with the next call in January. No other issues to address at this time. Will update the provider on pt's disposition with [facility] services.   Goals Addressed             This Visit's Progress    [facility]-Make and Keep All Appointments   On track    Timeframe:  Short-Term Goal-QUARTERLY FOLLOW UP REQUESTED. Priority:  Medium Start Date:  08/12/2020                           Expected End Date:   04/06/2021                 Follow Up Date 03/25/2021   - arrange a ride through an agency 1 week before appointment - ask family or friend for a ride - call to cancel if needed - keep a calendar with prescription refill dates - keep a calendar with appointment dates   Barriers: Health Behaviors  Why is this important?   Part of staying healthy is seeing the doctor for follow-up care.  If you forget your appointments,  there are some things you can do to stay on track.    Notes:  10/21-Pt states she continue to drive and has a care for her appointments however difficult with the expense of as and utilized [facility] her insurance company for transportation services. Pt states she is able to afford all her prescribed medications and takes them daily. Pt reminder not to over utilize her medications and if additional medications are needed to contact her provider for further interventions.  8/18-Pt states she is doing well with all scheduled appointments and verified transportation sources with no additional needs related to the information provided via care-guides. States she is awaiting call today to verify a scheduled date for knee surgery. States the doctors are not ble to remove any additional fluid from her knee and has recommended surgery.  7/22-  Reinforced pt that there are additional resources for transportation if she needs help and to call the number she has for this in advance. Pt had to rescheduled an appointment she was late upon arrival to 8/18 with her GI provider. 09/18/20 Pt has been able to attend her MD appts over the last month. Reinforced to her that there are additional resources for transportation if she needs help and to call the number she has for this in advance. 6/8-Verified pt uses [employer] for transportation and needs to call 3  days prior to her appointment date. Discussed options for a backup on transportation however mentioned she has a daughter [name] and a son who are able to assist if she needs additional assistance.      [facility]ain My Quality of Life   On track    Timeframe:  Long-Range Goal-QUARTERLY FOLLOW UP REQUESTED. Priority:  Medium Start Date:   08/12/2020                          Expected End Date:   04/06/2021                  Follow Up Date: 03/25/2021   - discuss my treatment options with the doctor or nurse - do one enjoyable thing every day - do something different, like talking  to a new person or going to a new place, every day - make shared treatment decisions with doctor - spend time outdoors at least 3 times a week   Barriers: Health Behaviors  Why is this important?   Having a long-term illness can be scary.  It can also be stressful for you and your caregiver.  These steps may help.    Notes:  10/21-Pt state she is doing much better now that she has received a voucher for housing and her landlord is working with her at this time. No additional needs related to shelter and verified she has sufficient amount of food-stamps for her ongoing health food items. States she quality of life has improved with no additional GI bleed and knee replacement. Pt also verified ongoing support from her daughter [name]. 09/18/20 Continues to have ongoing acute issues that are preventing her from a good quality of life. She reports she had her knee drained 3 weeks ago so that is feeling better. She continues to have some rectal bleeding and she is going to see her GI specialist today. She denies any SOB today. Encouraged her to continue to advocate for herself in getting appropriate care from the right provider in a timely manner. 6/8- Pt discussed her medical history with several back surgeries and her support system with her children. Pt also enjoys her two grandchildren. Pt lives in a mobile house and has supportive neighbors to assist when needed with her groceries and other needed items. Discussed the above goals and interventions with much encouraged for pt to participate in increasing her mobility if possible and cleared with her provider.         [name], RN Care Management Coordinator [place] Office [phone]

## 2020-12-28 ENCOUNTER — Ambulatory Visit: Payer: Self-pay | Admitting: *Deleted

## 2021-01-14 ENCOUNTER — Encounter: Payer: Self-pay | Admitting: *Deleted

## 2021-01-14 ENCOUNTER — Encounter (HOSPITAL_COMMUNITY): Payer: Self-pay | Admitting: Gastroenterology

## 2021-01-14 NOTE — Telephone Encounter (Signed)
This encounter was created in error - please disregard.

## 2021-01-15 ENCOUNTER — Other Ambulatory Visit: Payer: Self-pay | Admitting: Gastroenterology

## 2021-01-25 ENCOUNTER — Ambulatory Visit (HOSPITAL_COMMUNITY): Payer: Medicare Other | Admitting: Certified Registered"

## 2021-01-25 ENCOUNTER — Encounter (HOSPITAL_COMMUNITY)
Admission: RE | Disposition: A | Discharge status: Home / Self Care | Payer: Self-pay | Source: Home / Self Care | Attending: Gastroenterology

## 2021-01-25 ENCOUNTER — Ambulatory Visit (HOSPITAL_COMMUNITY)
Admission: RE | Admit: 2021-01-25 | Discharge: 2021-01-25 | Disposition: A | Discharge status: Home / Self Care | Payer: Medicare Other | Attending: Gastroenterology | Admitting: Gastroenterology

## 2021-01-25 ENCOUNTER — Other Ambulatory Visit: Payer: Self-pay

## 2021-01-25 ENCOUNTER — Encounter (HOSPITAL_COMMUNITY): Payer: Self-pay | Admitting: Gastroenterology

## 2021-01-25 DIAGNOSIS — R1013 Epigastric pain: Secondary | ICD-10-CM | POA: Insufficient documentation

## 2021-01-25 DIAGNOSIS — K640 First degree hemorrhoids: Secondary | ICD-10-CM | POA: Insufficient documentation

## 2021-01-25 DIAGNOSIS — K621 Rectal polyp: Secondary | ICD-10-CM | POA: Diagnosis not present

## 2021-01-25 DIAGNOSIS — K449 Diaphragmatic hernia without obstruction or gangrene: Secondary | ICD-10-CM | POA: Diagnosis not present

## 2021-01-25 DIAGNOSIS — K625 Hemorrhage of anus and rectum: Secondary | ICD-10-CM | POA: Insufficient documentation

## 2021-01-25 DIAGNOSIS — R1310 Dysphagia, unspecified: Secondary | ICD-10-CM | POA: Diagnosis present

## 2021-01-25 DIAGNOSIS — B9681 Helicobacter pylori [H. pylori] as the cause of diseases classified elsewhere: Secondary | ICD-10-CM | POA: Insufficient documentation

## 2021-01-25 DIAGNOSIS — R1319 Other dysphagia: Secondary | ICD-10-CM

## 2021-01-25 DIAGNOSIS — K219 Gastro-esophageal reflux disease without esophagitis: Secondary | ICD-10-CM | POA: Insufficient documentation

## 2021-01-25 DIAGNOSIS — K2970 Gastritis, unspecified, without bleeding: Secondary | ICD-10-CM | POA: Diagnosis not present

## 2021-01-25 DIAGNOSIS — Z1211 Encounter for screening for malignant neoplasm of colon: Secondary | ICD-10-CM

## 2021-01-25 HISTORY — PX: ESOPHAGOGASTRODUODENOSCOPY (EGD) WITH PROPOFOL: SHX5813

## 2021-01-25 HISTORY — PX: COLONOSCOPY WITH PROPOFOL: SHX5780

## 2021-01-25 HISTORY — PX: POLYPECTOMY: SHX5525

## 2021-01-25 HISTORY — PX: MALONEY DILATION: SHX5535

## 2021-01-25 HISTORY — PX: BIOPSY: SHX5522

## 2021-01-25 LAB — CBC
HCT: 33 % — ABNORMAL LOW (ref 36.0–46.0)
Hemoglobin: 10.3 g/dL — ABNORMAL LOW (ref 12.0–15.0)
MCH: 26.3 pg (ref 26.0–34.0)
MCHC: 31.2 g/dL (ref 30.0–36.0)
MCV: 84.4 fL (ref 80.0–100.0)
Platelets: 377 10*3/uL (ref 150–400)
RBC: 3.91 MIL/uL (ref 3.87–5.11)
RDW: 14.9 % (ref 11.5–15.5)
WBC: 6.8 10*3/uL (ref 4.0–10.5)
nRBC: 0 % (ref 0.0–0.2)

## 2021-01-25 LAB — COMPREHENSIVE METABOLIC PANEL
ALT: 14 U/L (ref 0–44)
AST: 15 U/L (ref 15–41)
Albumin: 3.8 g/dL (ref 3.5–5.0)
Alkaline Phosphatase: 64 U/L (ref 38–126)
Anion gap: 5 (ref 5–15)
BUN: 8 mg/dL (ref 6–20)
CO2: 28 mmol/L (ref 22–32)
Calcium: 9.1 mg/dL (ref 8.9–10.3)
Chloride: 107 mmol/L (ref 98–111)
Creatinine, Ser: 0.68 mg/dL (ref 0.44–1.00)
GFR, Estimated: >60 mL/min (ref >60)
Glucose, Bld: 104 mg/dL — ABNORMAL HIGH (ref 70–99)
Potassium: 3.3 mmol/L — ABNORMAL LOW (ref 3.5–5.1)
Sodium: 140 mmol/L (ref 135–145)
Total Bilirubin: 0.5 mg/dL (ref 0.3–1.2)
Total Protein: 6.6 g/dL (ref 6.5–8.1)

## 2021-01-25 SURGERY — COLONOSCOPY WITH PROPOFOL
Anesthesia: Monitor Anesthesia Care

## 2021-01-25 MED ORDER — LIDOCAINE 2% (20 MG/ML) 5 ML SYRINGE
INTRAMUSCULAR | Status: DC | PRN
  Administered 2021-01-25: 100 mg via INTRAVENOUS

## 2021-01-25 MED ORDER — SODIUM CHLORIDE 0.9 % IV SOLN: INTRAVENOUS | Status: DC

## 2021-01-25 MED ORDER — PROPOFOL 500 MG/50ML IV EMUL
INTRAVENOUS | Status: DC | PRN
  Administered 2021-01-25: 150 ug/kg/min via INTRAVENOUS

## 2021-01-25 MED ORDER — LACTATED RINGERS IV SOLN: INTRAVENOUS | Status: DC

## 2021-01-25 SURGICAL SUPPLY — 25 items

## 2021-01-25 NOTE — Discharge Instructions (Signed)
YOU HAD AN ENDOSCOPIC PROCEDURE TODAY: Refer to the procedure report and other information in the discharge instructions given to you for any specific questions about what was found during the examination. If this information does not answer your questions, please call [place] office at [phone] to clarify.  ° °YOU SHOULD EXPECT: Some feelings of bloating in the abdomen. Passage of more gas than usual. Walking can help get rid of the air that was put into your GI tract during the procedure and reduce the bloating. If you had a lower endoscopy (such as a colonoscopy or flexible sigmoidoscopy) you may notice spotting of blood in your stool or on the toilet paper. Some abdominal soreness may be present for a day or two, also. ° °DIET: Your first meal following the procedure should be a light meal and then it is ok to progress to your normal diet. A half-sandwich or bowl of soup is an example of a good first meal. Heavy or fried foods are harder to digest and may make you feel nauseous or bloated. Drink plenty of fluids but you should avoid alcoholic beverages for 24 hours. If you had a esophageal dilation, please see attached instructions for diet.   ° °ACTIVITY: Your care partner should take you home directly after the procedure. You should plan to take it easy, moving slowly for the rest of the day. You can resume normal activity the day after the procedure however YOU SHOULD NOT DRIVE, use power tools, machinery or perform tasks that involve climbing or major physical exertion for 24 hours (because of the sedation medicines used during the test).  ° °SYMPTOMS TO REPORT IMMEDIATELY: °A gastroenterologist can be reached at any hour. Please call [phone]  for any of the following symptoms:  °Following lower endoscopy (colonoscopy, flexible sigmoidoscopy) °Excessive amounts of blood in the stool  °Significant tenderness, worsening of abdominal pains  °Swelling of the abdomen that is new, acute  °Fever of 100° or  higher  °Following upper endoscopy (EGD, EUS, ERCP, esophageal dilation) °Vomiting of blood or coffee ground material  °New, significant abdominal pain  °New, significant chest pain or pain under the shoulder blades  °Painful or persistently difficult swallowing  °New shortness of breath  °Black, tarry-looking or red, bloody stools ° °FOLLOW UP:  °If any biopsies were taken you will be contacted by phone or by letter within the next 1-3 weeks. Call [phone]  if you have not heard about the biopsies in 3 weeks.  °Please also call with any specific questions about appointments or follow up tests. ° °

## 2021-01-25 NOTE — Op Note (Signed)
[facility] Patient Name: Brittney Carter Procedure Date: 01/25/2021 MRN: [id number] Attending MD: Jackquline Denmark , MD Date of Birth: 09/28/1960 CSN: [id number] Age: 60 Admit Type: Outpatient Procedure:                Colonoscopy Indications:              Rectal bleeding Providers:                Jackquline Denmark, MD, Jaci Carrel, RN, Frazier Richards, Technician Referring MD:             Dr [name] Medicines:                Monitored Anesthesia Care Complications:            No immediate complications. Estimated Blood Loss:     Estimated blood loss: none. Procedure:                Pre-Anesthesia Assessment:                           - Prior to the procedure, a History and Physical                            was performed, and patient medications and                            allergies were reviewed. The patient's tolerance of                            previous anesthesia was also reviewed. The risks                            and benefits of the procedure and the sedation                            options and risks were discussed with the patient.                            All questions were answered, and informed consent                            was obtained. Prior Anticoagulants: The patient has                            been off eliquis since 11/17. ASA Grade Assessment:                            II - A patient with mild systemic disease. After                            reviewing the risks and benefits, the patient was  deemed in satisfactory condition to undergo the                            procedure.                           After obtaining informed consent, the colonoscope                            was passed under direct vision. Throughout the                            procedure, the patient's blood pressure, pulse, and                            oxygen saturations were monitored continuously.  The                            CF-HQ190L ([id number] Olympus colonoscope was                            introduced through the anus and advanced to the 2                            cm into the ileum. The colonoscopy was performed                            without difficulty. The patient tolerated the                            procedure well. The quality of the bowel                            preparation was good. The terminal ileum, ileocecal                            valve, appendiceal orifice, and rectum were                            photographed. Scope In: 10:36:18 AM Scope Out: 10:50:42 AM Scope Withdrawal Time: 0 hours 10 minutes 25 seconds  Total Procedure Duration: 0 hours 14 minutes 24 seconds  Findings:      A 4 mm polyp was found in the mid rectum. The polyp was sessile. The       polyp was removed with a cold biopsy forceps. Resection and retrieval       were complete.      Non-bleeding internal hemorrhoids were found during retroflexion. The       hemorrhoids were moderate and Grade I (internal hemorrhoids that do not       prolapse).      The terminal ileum appeared normal.      The exam was otherwise without abnormality on direct and retroflexion       views. Impression:               - One 4 mm polyp in the mid  rectum, removed with a                            cold biopsy forceps. Resected and retrieved.                           - Non-bleeding internal hemorrhoids.                           - The examined portion of the ileum was normal.                           - The examination was otherwise normal on direct                            and retroflexion views. Moderate Sedation:      Not Applicable - Patient had care per Anesthesia. Recommendation:           - Patient has a contact number available for                            emergencies. The signs and symptoms of potential                            delayed complications were discussed with the                             patient. Return to normal activities tomorrow.                            Written discharge instructions were provided to the                            patient.                           - Resume high-fiber diet. Start miralax 17g po qd.                           - Continue present medications.                           - Resume Eliquis (apixaban) at prior dose tomorrow.                           - The findings and recommendations were discussed                            with the patient's family.                           - Preparation H cream: Apply externally BID for 10                            days.                           -  Check CBC, CMP today.                           - If still having rectal bleeding, would have my                            partner Dr. Bryan Lemma perform hemorrhoidal banding. Procedure Code(s):        --- Professional ---                           [phone], Colonoscopy, flexible; with biopsy, single                            or multiple Diagnosis Code(s):        --- Professional ---                           K62.1, Rectal polyp                           K64.0, First degree hemorrhoids                           K62.5, Hemorrhage of anus and rectum CPT copyright 2019 American Medical Association. All rights reserved. The codes documented in this report are preliminary and upon coder review may  be revised to meet current compliance requirements. Jackquline Denmark, MD 01/25/2021 11:05:59 AM This report has been signed electronically. Number of Addenda: 0

## 2021-01-25 NOTE — Op Note (Signed)
[facility] Patient Name: Brittney Carter Procedure Date: 01/25/2021 MRN: [id number] Attending MD: Jackquline Denmark , MD Date of Birth: [birth year] CSN: [id number] Age: 60 Admit Type: Outpatient Procedure:                Upper GI endoscopy Indications:              Dysphagia Providers:                Jackquline Denmark, MD, Jaci Carrel, RN, Frazier Richards, Technician Referring MD:             Dr [name]. Medicines:                Monitored Anesthesia Care Complications:            No immediate complications. Estimated Blood Loss:     Estimated blood loss: none. Procedure:                Pre-Anesthesia Assessment:                           - Prior to the procedure, a History and Physical                            was performed, and patient medications and                            allergies were reviewed. The patient's tolerance of                            previous anesthesia was also reviewed. The risks                            and benefits of the procedure and the sedation                            options and risks were discussed with the patient.                            All questions were answered, and informed consent                            was obtained. Prior Anticoagulants: The patient has                            taken Eliquis (apixaban), last dose was 2 days                            prior to procedure. ASA Grade Assessment: II - A                            patient with mild systemic disease. After reviewing  the risks and benefits, the patient was deemed in                            satisfactory condition to undergo the procedure.                           After obtaining informed consent, the endoscope was                            passed under direct vision. Throughout the                            procedure, the patient's blood pressure, pulse, and                            oxygen  saturations were monitored continuously. The                            [id number]-H190 ([id number]) Olympus endoscope was introduced                            through the mouth, and advanced to the second part                            of duodenum. The upper GI endoscopy was                            accomplished without difficulty. The patient                            tolerated the procedure well. Scope In: Scope Out: Findings:      Esophagus was normal with well-defined Z-line at 35 cm, examined by NBI.       Had no endoscopic abnormality was evident in the esophagus to explain       the patient's complaint of dysphagia. It was decided, however, to       proceed with dilation of the entire esophagus. The scope was withdrawn.       Dilation was performed with a Maloney dilator with mild resistance at 52       Fr. Biopsies were obtained from the proximal and distal esophagus with       cold forceps for histology of suspected eosinophilic esophagitis.      A small hiatal hernia was present.      Localized mild inflammation characterized by erythema was found in the       gastric antrum. Biopsies were taken with a cold forceps for histology.      The examined duodenum was normal. Biopsies for histology were taken with       a cold forceps for evaluation of celiac disease. Impression:               - Small hiatal hernia.                           - Gastritis. Biopsied.                           -  S/P empiric esophageal dilatation. Moderate Sedation:      Not Applicable - Patient had care per Anesthesia. Recommendation:           - Patient has a contact number available for                            emergencies. The signs and symptoms of potential                            delayed complications were discussed with the                            patient. Return to normal activities tomorrow.                            Written discharge instructions were provided to the                             patient.                           - Post dilatation diet.                           - Continue present medications.                           - Await pathology results.                           - The findings and recommendations were discussed                            with the patient's family. Procedure Code(s):        --- Professional ---                           [phone], Esophagogastroduodenoscopy, flexible,                            transoral; with biopsy, single or multiple                           43450, Dilation of esophagus, by unguided sound or                            bougie, single or multiple passes Diagnosis Code(s):        --- Professional ---                           R13.10, Dysphagia, unspecified                           K44.9, Diaphragmatic hernia without obstruction or                            gangrene  K29.70, Gastritis, unspecified, without bleeding                           R10.13, Epigastric pain CPT copyright 2019 American Medical Association. All rights reserved. The codes documented in this report are preliminary and upon coder review may  be revised to meet current compliance requirements. Jackquline Denmark, MD 01/25/2021 10:56:38 AM This report has been signed electronically. Number of Addenda: 0

## 2021-01-25 NOTE — Anesthesia Preprocedure Evaluation (Addendum)
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  Reproductive/Obstetrics                            Anesthesia Physical Anesthesia Plan  ASA: 2  Anesthesia Plan: MAC   Post-op Pain Management: Minimal or no pain anticipated   Induction:   PONV Risk Score and Plan: Treatment may vary due to age or medical condition and Propofol infusion  Airway Management Planned: Natural Airway and Nasal Cannula  Additional Equipment: None  Intra-op Plan:   Post-operative Plan:   Informed Consent: I have reviewed the patients History and Physical, chart, labs and discussed the procedure including the risks, benefits and alternatives for the proposed anesthesia with the patient or authorized representative who has indicated his/her understanding and acceptance.       Plan Discussed with: CRNA  Anesthesia Plan Comments:        Anesthesia Quick Evaluation

## 2021-01-25 NOTE — Transfer of Care (Signed)
Immediate Anesthesia Transfer of Care Note  Patient: Brittney Carter  Procedure(s) Performed: COLONOSCOPY WITH PROPOFOL ESOPHAGOGASTRODUODENOSCOPY (EGD) WITH PROPOFOL BIOPSY MALONEY DILATION POLYPECTOMY  Patient Location: Endoscopy Unit  Anesthesia Type:MAC  Level of Consciousness: sedated and patient cooperative  Airway & Oxygen Therapy: Patient Spontanous Breathing and Patient connected to face mask oxygen  Post-op Assessment: Report given to RN, Post -op Vital signs reviewed and stable and Patient moving all extremities  Post vital signs: Reviewed and stable  Last Vitals:  Vitals Value Taken Time  BP 118/73 01/25/21 1055  Temp    Pulse 63 01/25/21 1057  Resp 14 01/25/21 1057  SpO2 99 % 01/25/21 1057  Vitals shown include unvalidated device data.  Last Pain:  Vitals:   01/25/21 0936  TempSrc: Oral  PainSc: 10-Worst pain ever         Complications: No notable events documented.

## 2021-01-25 NOTE — Anesthesia Postprocedure Evaluation (Signed)
Anesthesia Post Note  Patient: Brittney Carter  Procedure(s) Performed: COLONOSCOPY WITH PROPOFOL ESOPHAGOGASTRODUODENOSCOPY (EGD) WITH PROPOFOL BIOPSY [place] POLYPECTOMY     Patient location during evaluation: PACU Anesthesia Type: MAC Level of consciousness: awake and alert and oriented Pain management: pain level controlled Vital Signs Assessment: post-procedure vital signs reviewed and stable Respiratory status: spontaneous breathing, nonlabored ventilation and respiratory function stable Cardiovascular status: blood pressure returned to baseline Postop Assessment: no apparent nausea or vomiting Anesthetic complications: no   No notable events documented.  Last Vitals:  Vitals:   01/25/21 1115 01/25/21 1120  BP: 136/76 130/80  Pulse: 63 60  Resp: (!) 22 17  Temp:    SpO2: 98% 95%    Last Pain:  Vitals:   01/25/21 1110  TempSrc:   PainSc: 0-No pain                 Marthenia Rolling

## 2021-01-25 NOTE — H&P (Signed)
Chief Complaint: FU from hospitalization   Referring Provider:  [name], MD        ASSESSMENT AND PLAN;    #1. Rectal bleeding. D/d hoids, AVMs, colitis, polyps, stercoral ulcers etc, r/o colonic neoplasms or IBD.   #2. H/O colon polyps 10/2016. FH colon Ca (brother <60)   #3. GERD with occ dysphagia   #4. IBS-C with abdominal bloating     Plan: -EGD with dil /Colon with 2 day prep at [facility]. -Omeperazole 69m po QD #30 -Need clearence to hold eliquis x 24hrs from Dr HNyra Capes-CBC and CMP at the time of procedures -Patient will also make FU appt with Dr. BMarco Collie    HPI:     Brittney SCHWANis a 60y.o. female  With Fibromyalgia, chronic LBP d/t OA s/p multiple back surgeries, OA s/p recent knee surgery complicated by DVT/PE on Eliquis, anxiety/depression   Complex patient with multiple GI issues.   [facility] with intermittent rectal bleeding.  Had attempted colonoscopy which was reported d/t poor prep.   C/O Rectal bleeding 1/month, lasting for 6-7 days with clots x 2-333yr  At times mixed with the stool. Has predominant constipation with pellet-like stools.  Occasional diarrhea. Hb 11 CT Abdo/pelvis 11/2020 was negative except for moderate colonic stool.   No further bleeding x 2 weeks.   Also has been having intermittent heartburn, dysphagia mostly to solids.  Mid-chest without any odynophagia.   Has chronic generalized abdominal pain with abdominal distention. Dx with IBS.  Bentyl helps with abdominal pain.     Past GI work-up:   CT Abdo/pelvis with contrast 11/25/2020 1. No acute intra-abdominal or pelvic pathology. No evidence of active GI bleed. 2. Moderate colonic stool burden. No bowel obstruction. Normal appendix. 3. Gallbladder sludge.   Colonoscopy 10/2016 by [facility]) -Colonic polyps s/p polypectomy. Bx- TA. Rpt in 5 yrs -Source for rectal bleeding was not identified.   EGD 10/2016 Dr. BuMelina Copa -Neg          Past Medical History:  Diagnosis Date   Arthritis      Back   Depression      no medication   Dyspnea      with exertion            Past Surgical History:  Procedure Laterality Date   BACK SURGERY   2016    Laminectomy   BACK SURGERY   1991    Disectomy   BREAST SURGERY        Breast lift   COLONOSCOPY   10/14/2016    Dr BuOrlena SheldonBenign neoplasm of ascending colon. Anal canal bleeding   COLONOSCOPY W/ POLYPECTOMY        x 2   ent surgery       ESOPHAGOGASTRODUODENOSCOPY   10/14/2016    Dr BuOrlena SheldonNormal EGD to second portion of duodenum   Pain stimulator trail       SPINAL CORD STIMULATOR INSERTION N/A 11/24/2016    Procedure: LUMBAR SPINAL CORD STIMULATOR INSERTION;  Surgeon: BrMelina SchoolsMD;  [facility];  Laterality: N/A;  2.5 hrs   SPINAL CORD STIMULATOR REMOVAL N/A 06/28/2017    Procedure: Spinal cord stimulator battery removal;  Surgeon: BrMelina SchoolsMD;  [facility];  Laterality: N/A;   TONSILLECTOMY        age 89 TUBAL LIGATION  Family History  Problem Relation Age of Onset   Breast cancer Sister     Colon cancer Brother          died at 47   Rectal cancer Neg Hx     Esophageal cancer Neg Hx     Stomach cancer Neg Hx        Social History        Tobacco Use   Smoking status: Never   Smokeless tobacco: Never  Vaping Use   Vaping Use: Never used  Substance Use Topics   Alcohol use: No   Drug use: No            Current Outpatient Medications  Medication Sig Dispense Refill   Artificial Tear Solution (SYSTANE CONTACTS OP) Place 1 drop into both eyes daily as needed (dry eyes).       bisacodyl (DULCOLAX) 5 MG EC tablet Take 1 tablet (5 mg total) by mouth daily as needed for moderate constipation. 30 tablet 1   cyclobenzaprine (FLEXERIL) 10 MG tablet Take 1 tablet (10 mg total) by mouth 2 (two) times daily as needed for muscle spasms. 20 tablet 0   ELIQUIS 5 MG TABS tablet  Take 5 mg by mouth 2 (two) times daily.       escitalopram (LEXAPRO) 10 MG tablet Take 15 mg by mouth daily.       hydrocortisone cream 1 % Apply topically 3 (three) times daily. 30 g 0   methocarbamol (ROBAXIN) 500 MG tablet Take 1 tablet (500 mg total) by mouth every 8 (eight) hours as needed for muscle spasms. 60 tablet 0   polyethylene glycol (MIRALAX / GLYCOLAX) 17 g packet Take 17 g by mouth daily. Can take up to twice a day for constipation. 30 each 3   traZODone (DESYREL) 50 MG tablet Take 50-100 mg by mouth at bedtime.       apixaban (ELIQUIS) 5 MG TABS tablet Take 2 tablets (10 mg total) by mouth 2 (two) times daily for 6 days, THEN 1 tablet (5 mg total) 2 (two) times daily for 24 days. 72 tablet 0   dicyclomine (BENTYL) 20 MG tablet Take 1 tablet (20 mg total) by mouth 3 (three) times daily before meals. (Patient not taking: Reported on 12/16/2020) 90 tablet 0    No current facility-administered medications for this visit.           Allergies  Allergen Reactions   Gabapentin Shortness Of Breath and Rash   Morphine Anaphylaxis, Hives, Shortness Of Breath and Rash   Tramadol Anaphylaxis, Swelling and Rash      headache   Hydrocodone-Ibuprofen Hives and Swelling      Review of Systems:  Constitutional: Denies fever, chills, diaphoresis, appetite change and has fatigue.  HEENT: Denies photophobia, eye pain, redness, hearing loss, ear pain, congestion, sore throat, rhinorrhea, sneezing, mouth sores, neck pain, neck stiffness and tinnitus.   Respiratory: Denies SOB, DOE, cough, chest tightness,  and wheezing.   Cardiovascular: Denies chest pain, palpitations and leg swelling.  Genitourinary: Denies dysuria, urgency, frequency, hematuria, flank pain and difficulty urinating.  Musculoskeletal: has myalgias, back pain, joint swelling, arthralgias and gait problem.  Skin: No rash.  Neurological: Denies dizziness, seizures, syncope, weakness, light-headedness, numbness and has  headaches.  Hematological: Denies adenopathy. Easy bruising, personal or family bleeding history  Psychiatric/Behavioral: Has anxiety or depression       Physical Exam:     BP 132/82   Pulse 70   Ht 5'  6" (1.676 m)   Wt 184 lb 4 oz (83.6 kg)   SpO2 98%   BMI 29.74 kg/m     Wt Readings from Last 3 Encounters:  12/16/20 184 lb 4 oz (83.6 kg)  11/28/20 174 lb 2.6 oz (79 kg)  11/07/20 175 lb (79.4 kg)    Constitutional:  Well-developed, in no acute distress. Psychiatric: Normal mood and affect. Behavior is normal. HEENT: Pupils normal.  Conjunctivae are normal. No scleral icterus. Cardiovascular: Normal rate, regular rhythm. No edema Pulmonary/chest: Effort normal and breath sounds normal. No wheezing, rales or rhonchi. Abdominal: Soft, nondistended.  Generalized abdominal wall tendernes.  bowel sounds active throughout. There are no masses palpable. No hepatomegaly. Neurological: Alert and oriented to person place and time. Skin: Skin is warm and dry. No rashes noted.   Brittney Carter

## 2021-01-26 LAB — SURGICAL PATHOLOGY

## 2021-01-27 ENCOUNTER — Encounter (HOSPITAL_COMMUNITY): Payer: Self-pay | Admitting: Gastroenterology

## 2021-01-31 ENCOUNTER — Encounter: Payer: Self-pay | Admitting: Gastroenterology

## 2021-01-31 NOTE — Progress Notes (Signed)
Brittney Carter can you let the pt know that gastric bx are + for H pylori. Would recommend the following treatment regimen for 14 days: Amoxicillin 1gm BID Clarithromycin 525m BID Flagyl 5045mBID Omeprazole 20 BID   Can you please order this if no signfiicant interactions noted. Once done with therapy, the patient should wait one month and perform a stool study for H pylori antigen to ensure negative. The PPI needs to be held at least 2 weeks prior to submitting the stool sample. The patient should avoid alcohol while taking Flagyl.   Thanks  Dr GuLyndel Safe

## 2021-02-01 ENCOUNTER — Other Ambulatory Visit: Payer: Self-pay

## 2021-02-01 DIAGNOSIS — B9681 Helicobacter pylori [H. pylori] as the cause of diseases classified elsewhere: Secondary | ICD-10-CM

## 2021-02-01 MED ORDER — CLARITHROMYCIN 500 MG PO TABS: 500.0000 mg | ORAL_TABLET | Freq: Two times a day (BID) | ORAL | 0 refills | Status: AC

## 2021-02-01 MED ORDER — METRONIDAZOLE 500 MG PO TABS
500.0000 mg | ORAL_TABLET | Freq: Two times a day (BID) | ORAL | 0 refills | Status: AC
Start: 2021-02-01 — End: 2021-02-15

## 2021-02-01 MED ORDER — AMOXICILLIN 500 MG PO CAPS
1000.0000 mg | ORAL_CAPSULE | Freq: Two times a day (BID) | ORAL | 0 refills | Status: AC
Start: 2021-02-01 — End: 2021-02-15

## 2021-02-01 MED ORDER — OMEPRAZOLE 20 MG PO CPDR: 20.0000 mg | DELAYED_RELEASE_CAPSULE | Freq: Two times a day (BID) | ORAL | 0 refills | Status: DC

## 2021-02-13 ENCOUNTER — Encounter (HOSPITAL_COMMUNITY): Payer: Medicare Other

## 2021-02-13 ENCOUNTER — Encounter (HOSPITAL_COMMUNITY): Payer: Self-pay

## 2021-02-13 ENCOUNTER — Emergency Department (HOSPITAL_COMMUNITY)
Admission: EM | Admit: 2021-02-13 | Discharge: 2021-02-14 | Disposition: A | Discharge status: Home / Self Care | Payer: Medicare Other | Attending: Emergency Medicine | Admitting: Emergency Medicine

## 2021-02-13 ENCOUNTER — Emergency Department (HOSPITAL_COMMUNITY): Payer: Medicare Other

## 2021-02-13 DIAGNOSIS — M7989 Other specified soft tissue disorders: Secondary | ICD-10-CM | POA: Diagnosis not present

## 2021-02-13 DIAGNOSIS — Z7901 Long term (current) use of anticoagulants: Secondary | ICD-10-CM | POA: Diagnosis not present

## 2021-02-13 DIAGNOSIS — R0602 Shortness of breath: Secondary | ICD-10-CM | POA: Insufficient documentation

## 2021-02-13 DIAGNOSIS — M79604 Pain in right leg: Secondary | ICD-10-CM | POA: Diagnosis present

## 2021-02-13 LAB — BASIC METABOLIC PANEL
Anion gap: 7 (ref 5–15)
BUN: 11 mg/dL (ref 6–20)
CO2: 24 mmol/L (ref 22–32)
Calcium: 8.8 mg/dL — ABNORMAL LOW (ref 8.9–10.3)
Chloride: 109 mmol/L (ref 98–111)
Creatinine, Ser: 0.78 mg/dL (ref 0.44–1.00)
GFR, Estimated: >60 mL/min (ref >60)
Glucose, Bld: 110 mg/dL — ABNORMAL HIGH (ref 70–99)
Potassium: 3.6 mmol/L (ref 3.5–5.1)
Sodium: 140 mmol/L (ref 135–145)

## 2021-02-13 LAB — CBC WITH DIFFERENTIAL/PLATELET
Abs Immature Granulocytes: 0.03 10*3/uL (ref 0.00–0.07)
Basophils Absolute: 0.1 10*3/uL (ref 0.0–0.1)
Basophils Relative: 1 %
Eosinophils Absolute: 0.5 10*3/uL (ref 0.0–0.5)
Eosinophils Relative: 7 %
HCT: 30 % — ABNORMAL LOW (ref 36.0–46.0)
Hemoglobin: 9.1 g/dL — ABNORMAL LOW (ref 12.0–15.0)
Immature Granulocytes: 0 %
Lymphocytes Relative: 30 %
Lymphs Abs: 2.3 10*3/uL (ref 0.7–4.0)
MCH: 26.5 pg (ref 26.0–34.0)
MCHC: 30.3 g/dL (ref 30.0–36.0)
MCV: 87.5 fL (ref 80.0–100.0)
Monocytes Absolute: 0.7 10*3/uL (ref 0.1–1.0)
Monocytes Relative: 10 %
Neutro Abs: 4 10*3/uL (ref 1.7–7.7)
Neutrophils Relative %: 52 %
Platelets: 331 10*3/uL (ref 150–400)
RBC: 3.43 MIL/uL — ABNORMAL LOW (ref 3.87–5.11)
RDW: 15.5 % (ref 11.5–15.5)
WBC: 7.6 10*3/uL (ref 4.0–10.5)
nRBC: 0 % (ref 0.0–0.2)

## 2021-02-13 MED ORDER — OXYCODONE-ACETAMINOPHEN 5-325 MG PO TABS
2.0000 | ORAL_TABLET | Freq: Once | ORAL | Status: AC
  Administered 2021-02-13: 2 via ORAL
  Filled 2021-02-13: qty 2

## 2021-02-13 MED ORDER — HYDROMORPHONE HCL 1 MG/ML IJ SOLN
0.5000 mg | Freq: Once | INTRAMUSCULAR | Status: AC
  Administered 2021-02-13: 0.5 mg via INTRAVENOUS
  Filled 2021-02-13: qty 1

## 2021-02-13 MED ORDER — ENOXAPARIN SODIUM 80 MG/0.8ML IJ SOSY
80.0000 mg | PREFILLED_SYRINGE | Freq: Once | INTRAMUSCULAR | Status: AC
  Administered 2021-02-13: 80 mg via SUBCUTANEOUS
  Filled 2021-02-13: qty 0.8

## 2021-02-13 NOTE — ED Notes (Signed)
Light Green, Purple, Blue top sent to lab if needed.

## 2021-02-13 NOTE — ED Triage Notes (Signed)
Pt reports pain and swelling to her right leg over the past couple of days. Reports some sob as well. Pt finished up her prescription of eliquis on nov 28th. She was being treated for dvt and pe. Pt concerned for same.

## 2021-02-13 NOTE — ED Provider Notes (Signed)
[facility] DEPT Provider Note   CSN: [id number] Arrival date & time: 02/13/21  1837     History Chief Complaint  Patient presents with   Leg Pain   Shortness of Breath    Brittney Carter is a 60 y.o. female.   Leg Pain Associated symptoms: no back pain, no fever and no neck pain   Patient presents for pain and swelling in her right lower extremity since yesterday.  She denies any recent injuries.  Pain and swelling have been present from just above her knee down to her foot.  Pain is worsened with movement and ambulation.  She denies any recent fevers or chills.  Initially, in triage, she endorsed shortness of breath but states that she feels that she is more just had anxiety.  Patient's anxiety stems from her fear that she has had a DVT recurrence.  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  She underwent 6 months of Eliquis.  Completed that 6 months 2 weeks ago and Eliquis was discontinued.  Currently, patient endorses continued pain in her distal right leg but denies any other symptoms.    Past Medical History:  Diagnosis Date   Arthritis    Back   Depression    no medication   Dyspnea    with exertion     Patient Active Problem List   Diagnosis Date Noted   Esophageal dysphagia    Screening for colon cancer    Lower GI bleed    History of pulmonary embolus (PE) 11/26/2020   Constipation 11/26/2020   Hypotension 11/26/2020   H/O right knee surgery 11/26/2020   Hematochezia    Family history of colon cancer    Lower abdominal pain    Rectal bleeding 11/25/2020   Suicidal thoughts 09/01/2020   Acute deep vein thrombosis (DVT) of distal vein of right lower extremity (HCC)    Acute pulmonary embolism ([place]) 08/04/2020   Back pain of thoracolumbar region 10/17/2017   Long-term current use of opiate analgesic 03/28/2017   Chronic pain 11/24/2016   Cervical radiculopathy at C7 12/03/2015   Sacroiliac joint pain  12/03/2015   Chronic low back pain 11/25/2014   Segmental and somatic dysfunction of pelvic region 10/28/2014   Disc degeneration, lumbar 08/12/2014   Lumbar radiculopathy 08/12/2014   Lumbar stenosis 08/12/2014   Osteoarthritis of spine with radiculopathy, lumbar region 08/12/2014    Past Surgical History:  Procedure Laterality Date   BACK SURGERY  2016   Laminectomy   BACK SURGERY  1991   Disectomy   BIOPSY  01/25/2021   Procedure: BIOPSY;  Surgeon: Jackquline Denmark, MD;  Location: [facility];  Service: Gastroenterology;;   BREAST SURGERY     Breast lift   COLONOSCOPY  10/14/2016   Dr Orlena Sheldon. Benign neoplasm of ascending colon. Anal canal bleeding   COLONOSCOPY W/ POLYPECTOMY     x 2   COLONOSCOPY WITH PROPOFOL N/A 01/25/2021   Procedure: COLONOSCOPY WITH PROPOFOL;  Surgeon: Jackquline Denmark, MD;  Location: [facility];  Service: Gastroenterology;  Laterality: N/A;   ent surgery     ESOPHAGOGASTRODUODENOSCOPY  10/14/2016   Dr Orlena Sheldon. Normal EGD to second portion of duodenum   ESOPHAGOGASTRODUODENOSCOPY (EGD) WITH PROPOFOL N/A 01/25/2021   Procedure: ESOPHAGOGASTRODUODENOSCOPY (EGD) WITH PROPOFOL;  Surgeon: Jackquline Denmark, MD;  Location: [facility];  Service: Gastroenterology;  Laterality: N/A;   MALONEY DILATION  01/25/2021   Procedure: Venia Minks DILATION;  Surgeon: Jackquline Denmark, MD;  Location: [facility];  Service: Gastroenterology;;   Pain stimulator trail     POLYPECTOMY  01/25/2021   Procedure: POLYPECTOMY;  Surgeon: Jackquline Denmark, MD;  Location: [facility];  Service: Gastroenterology;;   SPINAL CORD STIMULATOR INSERTION N/A 11/24/2016   Procedure: LUMBAR SPINAL CORD STIMULATOR INSERTION;  Surgeon: Melina Schools, MD;  Location: [facility];  Service: Orthopedics;  Laterality: N/A;  2.5 hrs   SPINAL CORD STIMULATOR REMOVAL N/A 06/28/2017   Procedure: Spinal cord stimulator battery removal;  Surgeon: Melina Schools, MD;  Location: [facility];  Service: Orthopedics;  Laterality: N/A;    TONSILLECTOMY     age 41   TUBAL LIGATION       OB History   No obstetric history on file.     Family History  Problem Relation Age of Onset   Breast cancer Sister    Colon cancer Brother        died at 57   Rectal cancer Neg Hx    Esophageal cancer Neg Hx    Stomach cancer Neg Hx     Social History   Tobacco Use   Smoking status: Never   Smokeless tobacco: Never  Vaping Use   Vaping Use: Never used  Substance Use Topics   Alcohol use: No   Drug use: No    Home Medications Prior to Admission medications   Medication Sig Start Date End Date Taking? Authorizing Provider  amoxicillin (AMOXIL) 500 MG capsule Take 2 capsules (1,000 mg total) by mouth 2 (two) times daily for 14 days. Patient taking differently: Take 1,000 mg by mouth 2 (two) times daily. Started 02/01/21 02/01/21 02/15/21 Yes Jackquline Denmark, MD  ARIPiprazole (ABILIFY) 5 MG tablet Take 2.5 mg by mouth at bedtime. 01/20/21  Yes [provider]  Carboxymethylcellulose Sodium (THERATEARS) 0.25 % SOLN Place 1 drop into both eyes daily as needed (dry eyes).   Yes [provider]  clarithromycin (BIAXIN) 500 MG tablet Take 1 tablet (500 mg total) by mouth 2 (two) times daily for 14 days. Patient taking differently: Take 500 mg by mouth 2 (two) times daily. Start date : 02/01/21 02/01/21 02/15/21 Yes Jackquline Denmark, MD  diclofenac Sodium (VOLTAREN) 1 % GEL Apply 1 application topically 3 (three) times daily as needed (pain).   Yes [provider]  metroNIDAZOLE (FLAGYL) 500 MG tablet Take 1 tablet (500 mg total) by mouth 2 (two) times daily for 14 days. Patient taking differently: Take 500 mg by mouth 2 (two) times daily. Start date : 02/01/21 02/01/21 02/15/21 Yes Jackquline Denmark, MD  omeprazole (PRILOSEC) 20 MG capsule TAKE 1 CAPSULE(20 MG) BY MOUTH DAILY Patient taking differently: 20 mg 2 (two) times daily before a meal. 01/15/21  Yes Jackquline Denmark, MD  oxyCODONE (OXY IR/ROXICODONE) 5 MG  immediate release tablet Take 5 mg by mouth every 4 (four) hours as needed for severe pain.   Yes [provider]  sertraline (ZOLOFT) 50 MG tablet Take 50 mg by mouth daily.   Yes [provider]  traZODone (DESYREL) 50 MG tablet Take 50-100 mg by mouth at bedtime as needed for sleep. 07/03/20  Yes [provider]  apixaban (ELIQUIS) 5 MG TABS tablet Take 5 mg by mouth 2 (two) times daily.    [provider]  bisacodyl (DULCOLAX) 5 MG EC tablet Take 1 tablet (5 mg total) by mouth daily as needed for moderate constipation. Patient not taking: Reported on 02/13/2021 11/29/20 11/29/21  Debbe Odea, MD  cyclobenzaprine (FLEXERIL) 10 MG tablet Take  1 tablet (10 mg total) by mouth 2 (two) times daily as needed for muscle spasms. Patient not taking: Reported on 01/19/2021 09/01/20   Gareth Morgan, MD  dicyclomine (BENTYL) 20 MG tablet Take 1 tablet (20 mg total) by mouth 3 (three) times daily before meals. Patient not taking: Reported on 01/19/2021 11/29/20   Debbe Odea, MD  hydrocortisone cream 1 % Apply topically 3 (three) times daily. Patient not taking: Reported on 01/19/2021 08/09/20   Nicole Kindred A, DO  methocarbamol (ROBAXIN) 500 MG tablet Take 1 tablet (500 mg total) by mouth every 8 (eight) hours as needed for muscle spasms. Patient not taking: Reported on 12/25/2020 11/29/20   Debbe Odea, MD  omeprazole (PRILOSEC) 20 MG capsule Take 1 capsule (20 mg total) by mouth 2 (two) times daily before a meal for 14 days. Patient not taking: Reported on 02/13/2021 02/01/21 02/15/21  Jackquline Denmark, MD  polyethylene glycol (MIRALAX / GLYCOLAX) 17 g packet Take 17 g by mouth daily. Can take up to twice a day for constipation. Patient not taking: Reported on 01/19/2021 11/29/20   Debbe Odea, MD    Allergies    Gabapentin, Morphine, Tramadol, and Hydrocodone-ibuprofen  Review of Systems   Review of Systems  Constitutional:  Negative for chills and fever.   HENT:  Negative for ear pain and sore throat.   Eyes:  Negative for pain and visual disturbance.  Respiratory:  Negative for cough, chest tightness, shortness of breath and wheezing.   Cardiovascular:  Positive for leg swelling. Negative for chest pain and palpitations.  Gastrointestinal:  Negative for abdominal pain, nausea and vomiting.  Genitourinary:  Negative for dysuria, hematuria and pelvic pain.  Musculoskeletal:  Positive for myalgias. Negative for arthralgias, back pain and neck pain.  Skin:  Negative for color change and rash.  Neurological:  Negative for seizures and syncope.  All other systems reviewed and are negative.  Physical Exam Updated Vital Signs BP 136/86   Pulse 76   Temp 97.9 F (36.6 C) (Oral)   Resp 18   SpO2 100%   Physical Exam Vitals and nursing note reviewed.  Constitutional:      General: She is not in acute distress.    Appearance: She is well-developed and normal weight. She is not ill-appearing, toxic-appearing or diaphoretic.  HENT:     Head: Normocephalic and atraumatic.     Mouth/Throat:     Mouth: Mucous membranes are moist.     Pharynx: Oropharynx is clear.  Eyes:     Extraocular Movements: Extraocular movements intact.     Conjunctiva/sclera: Conjunctivae normal.  Neck:     Vascular: No JVD.  Cardiovascular:     Rate and Rhythm: Normal rate and regular rhythm.     Heart sounds: No murmur heard. Pulmonary:     Effort: Pulmonary effort is normal. No tachypnea, accessory muscle usage or respiratory distress.     Breath sounds: Normal breath sounds. No decreased breath sounds, wheezing, rhonchi or rales.  Chest:     Chest wall: No tenderness.  Abdominal:     Palpations: Abdomen is soft.     Tenderness: There is no abdominal tenderness.  Musculoskeletal:        General: No swelling.     Cervical back: Neck supple.     Right lower leg: Tenderness present.     Left lower leg: No tenderness.  Skin:    General: Skin is warm and dry.      Capillary Refill: Capillary refill  takes less than 2 seconds.     Coloration: Skin is not cyanotic or pale.  Neurological:     General: No focal deficit present.     Mental Status: She is alert and oriented to person, place, and time.     Cranial Nerves: No cranial nerve deficit.     Motor: No weakness.  Psychiatric:        Mood and Affect: Mood normal.        Behavior: Behavior normal.     ED Results / Procedures / Treatments   Labs (all labs ordered are listed, but only abnormal results are displayed) Labs Reviewed  CBC WITH DIFFERENTIAL/PLATELET - Abnormal; Notable for the following components:      Result Value   RBC 3.43 (*)    Hemoglobin 9.1 (*)    HCT 30.0 (*)    All other components within normal limits  BASIC METABOLIC PANEL - Abnormal; Notable for the following components:   Glucose, Bld 110 (*)    Calcium 8.8 (*)    All other components within normal limits    EKG None  Radiology VAS [place] LOWER EXTREMITY VENOUS (DVT) (ONLY [facility])  Result Date: 02/14/2021  Lower Venous DVT Study Patient Name:  BRINNLEY LACAP  Date of Exam:   02/14/2021 Medical Rec #: [id number]          Accession #:    [id number] Date of Birth: May 24, 1960          Patient Gender: F Patient Age:   46 years Exam Location:  [facility] Procedure:      VAS [place] LOWER EXTREMITY VENOUS (DVT) Referring Phys: LESLIE SOFIA --------------------------------------------------------------------------------  Indications: Edema.  Comparison Study: 08/25/2020 Right lower extremity venous duplex- age                   indeterminate popliteal thrombus. Performing Technologist: Maudry Mayhew MHA, RDMS, RVT, RDCS  Examination Guidelines: A complete evaluation includes B-mode imaging, spectral Doppler, color Doppler, and power Doppler as needed of all accessible portions of each vessel. Bilateral testing is considered an integral part of a complete examination. Limited examinations for reoccurring indications  may be performed as noted. The reflux portion of the exam is performed with the patient in reverse Trendelenburg.  +---------+---------------+---------+-----------+---------------+--------------+ RIGHT    CompressibilityPhasicitySpontaneityProperties     Thrombus Aging +---------+---------------+---------+-----------+---------------+--------------+ CFV      Full           Yes      Yes                                      +---------+---------------+---------+-----------+---------------+--------------+ SFJ      Full                                                             +---------+---------------+---------+-----------+---------------+--------------+ FV Prox  Full                                                             +---------+---------------+---------+-----------+---------------+--------------+  FV Mid   Full                                                             +---------+---------------+---------+-----------+---------------+--------------+ FV DistalFull                                                             +---------+---------------+---------+-----------+---------------+--------------+ PFV      Full                                                             +---------+---------------+---------+-----------+---------------+--------------+ POP      Full           Yes      Yes        Dupliate                                                                  popliteal                                                                 veins; both                                                               patent                        +---------+---------------+---------+-----------+---------------+--------------+ PTV      Full                                                             +---------+---------------+---------+-----------+---------------+--------------+ PERO     Full                                                              +---------+---------------+---------+-----------+---------------+--------------+   +----+---------------+---------+-----------+----------+--------------+ LEFTCompressibilityPhasicitySpontaneityPropertiesThrombus Aging +----+---------------+---------+-----------+----------+--------------+ CFV Full  Yes      Yes                                 +----+---------------+---------+-----------+----------+--------------+     Summary: RIGHT: - Findings suggest resolution of previously noted thrombus. - There is no evidence of deep vein thrombosis in the lower extremity.  - No cystic structure found in the popliteal fossa.  LEFT: - No evidence of common femoral vein obstruction.  *See table(s) above for measurements and observations.    Preliminary     Procedures Procedures   Medications Ordered in ED Medications  enoxaparin (LOVENOX) injection 80 mg (80 mg Subcutaneous Given 02/13/21 2255)  oxyCODONE-acetaminophen (PERCOCET/ROXICET) 5-325 MG per tablet 2 tablet (2 tablets Oral Given 02/13/21 2158)  HYDROmorphone (DILAUDID) injection 0.5 mg (0.5 mg Intravenous Given 02/13/21 2337)    ED Course  I have reviewed the triage vital signs and the nursing notes.  Pertinent labs & imaging results that were available during my care of the patient were reviewed by me and considered in my medical decision making (see chart for details).    MDM Rules/Calculators/A&P                          Patient presents for pain and swelling in her distal right leg since yesterday.  She has a history of DVT and completed her 69-monthanticoagulation therapy 2 weeks ago.  She is concerned of recurrence of DVT.  On exam, she is well-appearing.  Distal right leg is greater in diameter than left.  She does have diffuse tenderness throughout this distal right leg.  Ultrasound DVT study was ordered.  Patient's lab work did not show any leukocytosis.  Hemoglobin is slightly below baseline  at 9.1.  Unfortunately, patient presents to the ED after hours when ultrasound imaging studies are readily available.  I did discuss with her options and patient elects to stay in the ED waiting room overnight to undergo ultrasound study in the morning.  Dose of Lovenox was given.  Patient does endorse continued severe pain and does request pain medication.  Per chart review, she has listed allergies to multiple analgesics.  She stated that Dilaudid is the only thing that works for her, although she has tolerated oxycodone.  Oxycodone was given.  Subsequently, patient endorsed continued pain and 0.5 g of Dilaudid was given.  Charge nurse was agreeable to have patient stay in the room/hallway without nursing care overnight.  Care of patient was signed out to oncoming ED provider.  Final Clinical Impression(s) / ED Diagnoses Final diagnoses:  Right leg swelling    Rx / DC Orders ED Discharge Orders     None        DGodfrey Pick MD 02/14/21 1611

## 2021-02-13 NOTE — ED Provider Notes (Signed)
Emergency Medicine Provider Triage Evaluation Note  Brittney Carter , a 60 y.o. female  was evaluated in triage.  Pt complains of pain right leg swelling.  Pt has had a previous dvt.  Pt no longer on anticoagulation   Review of Systems  Positive: Swelling and pain Negative: No fever  Physical Exam  BP (!) 144/84 (BP Location: Right Arm)   Pulse 89   Temp 98.7 F (37.1 C) (Oral)   Resp 16   SpO2 100%  Gen:   Awake, no distress   Resp:  Normal effort  MSK:   Moves extremities without difficulty  Other:  Swollen tender right leg,  positive homan's   Medical Decision Making  Medically screening exam initiated at 7:00 PM.  Appropriate orders placed.  [name] was informed that the remainder of the evaluation will be completed by another provider, this initial triage assessment does not replace that evaluation, and the importance of remaining in the ED until their evaluation is complete.     Fransico Meadow, [place] 02/13/21 1902    Valarie Merino, MD 02/13/21 [phone]

## 2021-02-14 ENCOUNTER — Emergency Department (HOSPITAL_BASED_OUTPATIENT_CLINIC_OR_DEPARTMENT_OTHER): Payer: Medicare Other

## 2021-02-14 DIAGNOSIS — R609 Edema, unspecified: Secondary | ICD-10-CM

## 2021-02-14 NOTE — Progress Notes (Signed)
Right lower extremity venous duplex completed. Refer to "CV Proc" under chart review to view preliminary results.  02/14/2021 8:31 AM Kelby Aline., MHA, RVT, RDCS, RDMS

## 2021-02-14 NOTE — ED Provider Notes (Signed)
Patient has been waiting on DVT ultrasound.  This ultrasound has been performed and there is nonspecific swelling but no blood clot.  Patient has dopplerable pulses in both feet.  There is some swelling right more than left.  No overt cellulitis.  At this point unclear why she is having the recurrent swelling but it does not seem to be a clot, though I discussed she will need outpatient repeat ultrasound if continued to have symptoms.  Discharged home to follow-up with PCP.   Sherwood Gambler, MD 02/14/21 [phone]

## 2021-02-17 ENCOUNTER — Other Ambulatory Visit: Payer: Self-pay | Admitting: *Deleted

## 2021-03-03 ENCOUNTER — Other Ambulatory Visit: Payer: Self-pay | Admitting: Orthopedic Surgery

## 2021-03-04 NOTE — Progress Notes (Signed)
Requested surgery orders via Epic inbox. °

## 2021-03-09 NOTE — Patient Instructions (Addendum)
DUE TO COVID-19 ONLY ONE VISITOR IS ALLOWED TO COME WITH YOU AND STAY IN THE WAITING ROOM ONLY DURING PRE OP AND PROCEDURE.   **NO VISITORS ARE ALLOWED IN THE SHORT STAY AREA OR RECOVERY ROOM!!**  IF YOU WILL BE ADMITTED INTO THE HOSPITAL YOU ARE ALLOWED ONLY TWO SUPPORT PEOPLE DURING VISITATION HOURS ONLY (7 AM -8PM)   The support person(s) must pass our screening, gel in and out, and wear a mask at all times, including in the patients room. Patients must also wear a mask when staff or their support person are in the room. Visitors GUEST BADGE MUST BE WORN VISIBLY  One adult visitor may remain with you overnight and MUST be in the room by 8 P.M.  No visitors under the age of 39. Any visitor under the age of 72 must be accompanied by an adult.        COVID SWAB TESTING MUST BE COMPLETED ON:  03/17/21 @ 9:00 AM at [facility]. Come in through main entrance. Be sit in the lobby area to the right as you come in the main entrance. Dial [phone] give them your name and let them know you are here for COVID testing.   Your procedure is scheduled on: 03/19/21   Report to [facility] Main Entrance    Report to admitting at: 12:45 PM   Call this number if you have problems the morning of surgery [phone]   Do not eat food :After Midnight.   May have liquids until : 12:30 PM   day of surgery  CLEAR LIQUID DIET  Foods Allowed                                                                     Foods Excluded  Water, Black Coffee and tea, regular and decaf                             liquids that you cannot  Plain Jell-O in any flavor  (No red)                                           see through such as: Fruit ices (not with fruit pulp)                                     milk, soups, orange juice              Iced Popsicles (No red)                                    All solid food                                   Apple juices Sports drinks like Gatorade (No  red) Lightly seasoned clear broth or consume(fat free) Sugar  Sample Menu  Breakfast                                Lunch                                     Supper Cranberry juice                    Beef broth                            Chicken broth Jell-O                                     Grape juice                           Apple juice Coffee or tea                        Jell-O                                      Popsicle                                                Coffee or tea                        Coffee or tea    Oral Hygiene is also important to reduce your risk of infection.                                    Remember - BRUSH YOUR TEETH THE MORNING OF SURGERY WITH YOUR REGULAR TOOTHPASTE   Do NOT smoke after Midnight   Take these medicines the morning of surgery with A SIP OF WATER: omeprazole.  DO NOT TAKE ANY ORAL DIABETIC MEDICATIONS DAY OF YOUR SURGERY                              You may not have any metal on your body including hair pins, jewelry, and body piercing             Do not wear make-up, lotions, powders, perfumes/cologne, or deodorant  Do not wear nail polish including gel and S&S, artificial/acrylic nails, or any other type of covering on natural nails including finger and toenails. If you have artificial nails, gel coating, etc. that needs to be removed by a nail salon please have this removed prior to surgery or surgery may need to be canceled/ delayed if the surgeon/ anesthesia feels like they are unable to be safely monitored.   Do not shave  48 hours prior to surgery.    Do not bring valuables to the hospital. [facility] IS NOT             RESPONSIBLE  FOR VALUABLES.   Contacts, dentures or bridgework may not be worn into surgery.   Bring small overnight bag day of surgery.    Patients discharged on the day of surgery will not be allowed to drive home.   Special Instructions: Bring a copy of your healthcare power of attorney and living  will documents         the day of surgery if you haven't scanned them before.              Please read over the following fact sheets you were given: IF YOU HAVE QUESTIONS ABOUT YOUR PRE-OP INSTRUCTIONS PLEASE CALL [phone]     [facility] - Preparing for Surgery Before surgery, you can play an important role.  Because skin is not sterile, your skin needs to be as free of germs as possible.  You can reduce the number of germs on your skin by washing with CHG (chlorahexidine gluconate) soap before surgery.  CHG is an antiseptic cleaner which kills germs and bonds with the skin to continue killing germs even after washing. Please DO NOT use if you have an allergy to CHG or antibacterial soaps.  If your skin becomes reddened/irritated stop using the CHG and inform your nurse when you arrive at Short Stay. Do not shave (including legs and underarms) for at least 48 hours prior to the first CHG shower.  You may shave your face/neck. Please follow these instructions carefully:  1.  Shower with CHG Soap the night before surgery and the  morning of Surgery.  2.  If you choose to wash your hair, wash your hair first as usual with your  normal  shampoo.  3.  After you shampoo, rinse your hair and body thoroughly to remove the  shampoo.                           4.  Use CHG as you would any other liquid soap.  You can apply chg directly  to the skin and wash                       Gently with a scrungie or clean washcloth.  5.  Apply the CHG Soap to your body ONLY FROM THE NECK DOWN.   Do not use on face/ open                           Wound or open sores. Avoid contact with eyes, ears mouth and genitals (private parts).                       Wash face,  Genitals (private parts) with your normal soap.             6.  Wash thoroughly, paying special attention to the area where your surgery  will be performed.  7.  Thoroughly rinse your body with warm water from the neck down.  8.  DO NOT shower/wash with  your normal soap after using and rinsing off  the CHG Soap.                9.  Pat yourself dry with a clean towel.            10.  Wear clean pajamas.            11.  Place clean sheets on your bed  the night of your first shower and do not  sleep with pets. Day of Surgery : Do not apply any lotions/deodorants the morning of surgery.  Please wear clean clothes to the hospital/surgery center.  FAILURE TO FOLLOW THESE INSTRUCTIONS MAY RESULT IN THE CANCELLATION OF YOUR SURGERY PATIENT SIGNATURE_________________________________  NURSE SIGNATURE__________________________________  ________________________________________________________________________

## 2021-03-11 ENCOUNTER — Encounter (HOSPITAL_COMMUNITY)
Admission: RE | Admit: 2021-03-11 | Discharge: 2021-03-11 | Disposition: A | Discharge status: Home / Self Care | Payer: Medicare Other | Source: Ambulatory Visit | Attending: Orthopedic Surgery | Admitting: Orthopedic Surgery

## 2021-03-11 ENCOUNTER — Other Ambulatory Visit: Payer: Self-pay

## 2021-03-11 ENCOUNTER — Other Ambulatory Visit: Payer: Commercial Managed Care - HMO

## 2021-03-11 ENCOUNTER — Encounter (HOSPITAL_COMMUNITY): Payer: Self-pay

## 2021-03-11 VITALS — BP 120/85 | HR 84 | Temp 98.0°F | Resp 18 | Ht 61.0 in | Wt 185.5 lb

## 2021-03-11 DIAGNOSIS — Z01812 Encounter for preprocedural laboratory examination: Secondary | ICD-10-CM | POA: Diagnosis not present

## 2021-03-11 DIAGNOSIS — Z86711 Personal history of pulmonary embolism: Secondary | ICD-10-CM | POA: Insufficient documentation

## 2021-03-11 DIAGNOSIS — K2970 Gastritis, unspecified, without bleeding: Secondary | ICD-10-CM

## 2021-03-11 DIAGNOSIS — Z01818 Encounter for other preprocedural examination: Secondary | ICD-10-CM

## 2021-03-11 HISTORY — DX: Acute embolism and thrombosis of unspecified deep veins of unspecified lower extremity: I82.409

## 2021-03-11 HISTORY — DX: Anxiety disorder, unspecified: F41.9

## 2021-03-11 LAB — BASIC METABOLIC PANEL
Anion gap: 7 (ref 5–15)
BUN: 12 mg/dL (ref 6–20)
CO2: 26 mmol/L (ref 22–32)
Calcium: 9.6 mg/dL (ref 8.9–10.3)
Chloride: 107 mmol/L (ref 98–111)
Creatinine, Ser: 0.77 mg/dL (ref 0.44–1.00)
GFR, Estimated: >60 mL/min (ref >60)
Glucose, Bld: 111 mg/dL — ABNORMAL HIGH (ref 70–99)
Potassium: 4.2 mmol/L (ref 3.5–5.1)
Sodium: 140 mmol/L (ref 135–145)

## 2021-03-11 LAB — CBC
HCT: 35.9 % — ABNORMAL LOW (ref 36.0–46.0)
Hemoglobin: 10.9 g/dL — ABNORMAL LOW (ref 12.0–15.0)
MCH: 25.5 pg — ABNORMAL LOW (ref 26.0–34.0)
MCHC: 30.4 g/dL (ref 30.0–36.0)
MCV: 84.1 fL (ref 80.0–100.0)
Platelets: 383 10*3/uL (ref 150–400)
RBC: 4.27 MIL/uL (ref 3.87–5.11)
RDW: 15.6 % — ABNORMAL HIGH (ref 11.5–15.5)
WBC: 7.3 10*3/uL (ref 4.0–10.5)
nRBC: 0 % (ref 0.0–0.2)

## 2021-03-11 LAB — SURGICAL PCR SCREEN
MRSA, PCR: NEGATIVE
Staphylococcus aureus: NEGATIVE

## 2021-03-11 NOTE — Progress Notes (Signed)
COVID Vaccine Completed: Yes Date COVID Vaccine completed: 2022 x 3 COVID vaccine manufacturer:   Gala Murdoch    COVID Test: 03/17/21 PCP - Dr. Abner Greenspan Cardiologist -   Chest x-ray -  EKG - 10/11/20 Stress Test -  ECHO - 08/05/20 Cardiac Cath -  Pacemaker/ICD device last checked:  Sleep Study -  CPAP -   Fasting Blood Sugar -  Checks Blood Sugar _____ times a day  Blood Thinner Instructions: Eliquis it've been on hold since 02/01/21 as per Dr. Luiz Blare instructions. Aspirin Instructions: Last Dose:  Anesthesia review: Hx: DVT  Patient denies shortness of breath, fever, cough and chest pain at PAT appointment   Patient verbalized understanding of instructions that were given to them at the PAT appointment. Patient was also instructed that they will need to review over the PAT instructions again at home before surgery.

## 2021-03-13 LAB — H. PYLORI ANTIGEN, STOOL: H pylori Ag, Stl: POSITIVE — AB

## 2021-03-15 ENCOUNTER — Telehealth: Payer: Self-pay | Admitting: Gastroenterology

## 2021-03-15 NOTE — Telephone Encounter (Signed)
Patient called and stated that she got a e-mail over the weekend that she was still positive for H- pylori. Seeking advice, please advise.

## 2021-03-15 NOTE — Telephone Encounter (Signed)
Pt stated that she received submitted a stool sample on 03/11/2021. Pt stated that she received a my chart message over the weekend stating that she was Positive for H Pylori: Pt states that she is having some Bloating and some abdominal pain for a week.  Please advise

## 2021-03-16 ENCOUNTER — Other Ambulatory Visit: Payer: Self-pay

## 2021-03-16 DIAGNOSIS — B9681 Helicobacter pylori [H. pylori] as the cause of diseases classified elsewhere: Secondary | ICD-10-CM

## 2021-03-16 DIAGNOSIS — K2970 Gastritis, unspecified, without bleeding: Secondary | ICD-10-CM

## 2021-03-16 MED ORDER — BISMUTH SUBSALICYLATE 262 MG PO CHEW: 524.0000 mg | CHEWABLE_TABLET | Freq: Four times a day (QID) | ORAL | 0 refills | Status: DC

## 2021-03-16 MED ORDER — OMEPRAZOLE 20 MG PO CPDR: 20.0000 mg | DELAYED_RELEASE_CAPSULE | Freq: Two times a day (BID) | ORAL | 0 refills | Status: DC

## 2021-03-16 MED ORDER — METRONIDAZOLE 250 MG PO TABS: 250.0000 mg | ORAL_TABLET | Freq: Four times a day (QID) | ORAL | 0 refills | Status: AC

## 2021-03-16 MED ORDER — DOXYCYCLINE HYCLATE 50 MG PO CAPS: 100.0000 mg | ORAL_CAPSULE | Freq: Two times a day (BID) | ORAL | 0 refills | Status: AC

## 2021-03-16 MED ORDER — METRONIDAZOLE 250 MG PO TABS: 250.0000 mg | ORAL_TABLET | Freq: Four times a day (QID) | ORAL | 0 refills | Status: DC

## 2021-03-16 MED ORDER — DOXYCYCLINE HYCLATE 50 MG PO CAPS: 100.0000 mg | ORAL_CAPSULE | Freq: Two times a day (BID) | ORAL | 0 refills | Status: DC

## 2021-03-16 MED ORDER — BISMUTH SUBSALICYLATE 262 MG PO CHEW: 524.0000 mg | CHEWABLE_TABLET | Freq: Four times a day (QID) | ORAL | 0 refills | Status: AC

## 2021-03-16 NOTE — Addendum Note (Signed)
Addended by: Emeline Darling on: 03/16/2021 02:17 PM   Modules accepted: Orders

## 2021-03-16 NOTE — Telephone Encounter (Signed)
She was previously treated with clarithromycin based treatment  Stool still positive for H. pylori antigen.     Plan: 1) Omeprazole 20 mg 2 times a day  2) Pepto Bismol 2 tabs (262 mg each) 4 times a day x 14 d 3) Metronidazole 250 mg 4 times a day x 14 d 4) doxycycline 100 mg 2 times a day x 14 d     4 weeks after treatment completed, can hold PPI x10 days in order to check H. Pylori stool antigen to confirm eradication (must be off acid suppression therapy).  RG

## 2021-03-16 NOTE — Telephone Encounter (Signed)
Prescriptions sent to pharmacy. Pt made aware. Pt made aware of treatment. Treatment was sent to pt via My Chart.  Order entered into Epic for the H. Pylori stool Antigen: Pt made aware:  Pt verbalized understanding with all questions answered.

## 2021-03-17 ENCOUNTER — Other Ambulatory Visit: Payer: Self-pay

## 2021-03-17 ENCOUNTER — Encounter (HOSPITAL_COMMUNITY)
Admission: RE | Admit: 2021-03-17 | Discharge: 2021-03-17 | Disposition: A | Discharge status: Home / Self Care | Payer: Commercial Managed Care - HMO | Source: Ambulatory Visit | Attending: Orthopedic Surgery | Admitting: Orthopedic Surgery

## 2021-03-17 DIAGNOSIS — Z01812 Encounter for preprocedural laboratory examination: Secondary | ICD-10-CM | POA: Diagnosis not present

## 2021-03-17 DIAGNOSIS — Z20822 Contact with and (suspected) exposure to covid-19: Secondary | ICD-10-CM | POA: Diagnosis not present

## 2021-03-17 LAB — SARS CORONAVIRUS 2 (TAT 6-24 HRS): SARS Coronavirus 2: NEGATIVE

## 2021-03-18 NOTE — Anesthesia Preprocedure Evaluation (Addendum)
Anesthesia Evaluation  Patient identified by MRN, date of birth, ID band Patient awake    Reviewed: Allergy & Precautions, NPO status , Patient's Chart, lab work & pertinent test results, reviewed documented beta blocker date and time   Airway Mallampati: II  TM Distance: >3 FB Neck ROM: Full    Dental  (+) Dental Advisory Given, Chipped, Missing,    Pulmonary shortness of breath, PE   Pulmonary exam normal breath sounds clear to auscultation       Cardiovascular Exercise Tolerance: Poor + DVT  Normal cardiovascular exam Rhythm:Regular Rate:Normal     Neuro/Psych PSYCHIATRIC DISORDERS Anxiety Depression Hx/o cervical and lumbar radiculopathy  Neuromuscular disease    GI/Hepatic negative GI ROS, Neg liver ROS,   Endo/Other  Obesity  Renal/GU negative Renal ROS  negative genitourinary   Musculoskeletal  (+) Arthritis , Osteoarthritis,  Chronic low back pain with radiculopathy   Abdominal (+) + obese,   Peds  Hematology Lab Results      Component                Value               Date                      WBC                      7.3                 03/11/2021                HGB                      10.9 (L)            03/11/2021                HCT                      35.9 (L)            03/11/2021                MCV                      84.1                03/11/2021                PLT                      383                 03/11/2021              Anesthesia Other Findings   Reproductive/Obstetrics                            Anesthesia Physical Anesthesia Plan  ASA: 3  Anesthesia Plan: General   Post-op Pain Management: Regional block   Induction: Intravenous  PONV Risk Score and Plan: Treatment may vary due to age or medical condition, Ondansetron and Midazolam  Airway Management Planned: LMA  Additional Equipment: None  Intra-op Plan:   Post-operative Plan:   Informed  Consent: I have reviewed the patients History and Physical, chart, labs and discussed the procedure including the risks,  benefits and alternatives for the proposed anesthesia with the patient or authorized representative who has indicated his/her understanding and acceptance.     Dental advisory given  Plan Discussed with: CRNA and Anesthesiologist  Anesthesia Plan Comments: (Patient refusing SAB. )      Anesthesia Quick Evaluation

## 2021-03-19 ENCOUNTER — Encounter (HOSPITAL_COMMUNITY)
Admission: RE | Disposition: A | Discharge status: Home / Self Care | Payer: Self-pay | Source: Home / Self Care | Attending: Orthopedic Surgery

## 2021-03-19 ENCOUNTER — Other Ambulatory Visit: Payer: Self-pay

## 2021-03-19 ENCOUNTER — Observation Stay (HOSPITAL_COMMUNITY)
Admission: RE | Admit: 2021-03-19 | Discharge: 2021-03-20 | Disposition: A | Discharge status: Home / Self Care | Payer: Medicare Other | Attending: Orthopedic Surgery | Admitting: Orthopedic Surgery

## 2021-03-19 ENCOUNTER — Ambulatory Visit (HOSPITAL_COMMUNITY): Payer: Medicare Other | Admitting: Anesthesiology

## 2021-03-19 ENCOUNTER — Ambulatory Visit (HOSPITAL_COMMUNITY): Payer: Medicare Other | Admitting: Physician Assistant

## 2021-03-19 ENCOUNTER — Encounter (HOSPITAL_COMMUNITY): Payer: Self-pay | Admitting: Orthopedic Surgery

## 2021-03-19 DIAGNOSIS — Z86711 Personal history of pulmonary embolism: Secondary | ICD-10-CM | POA: Diagnosis not present

## 2021-03-19 DIAGNOSIS — Z7901 Long term (current) use of anticoagulants: Secondary | ICD-10-CM | POA: Insufficient documentation

## 2021-03-19 DIAGNOSIS — Z79899 Other long term (current) drug therapy: Secondary | ICD-10-CM | POA: Insufficient documentation

## 2021-03-19 DIAGNOSIS — M1711 Unilateral primary osteoarthritis, right knee: Secondary | ICD-10-CM | POA: Diagnosis present

## 2021-03-19 HISTORY — PX: TOTAL KNEE ARTHROPLASTY: SHX125

## 2021-03-19 LAB — TYPE AND SCREEN
ABO/RH(D): A POS
Antibody Screen: NEGATIVE

## 2021-03-19 SURGERY — ARTHROPLASTY, KNEE, TOTAL
Anesthesia: General | Site: Knee | Laterality: Right

## 2021-03-19 MED ORDER — CHLORHEXIDINE GLUCONATE 0.12 % MT SOLN
15.0000 mL | Freq: Once | OROMUCOSAL | Status: AC
  Administered 2021-03-19: 15 mL via OROMUCOSAL

## 2021-03-19 MED ORDER — OXYCODONE-ACETAMINOPHEN 5-325 MG PO TABS
1.0000 | ORAL_TABLET | Freq: Four times a day (QID) | ORAL | 0 refills | Status: AC | PRN
Start: 2021-03-19 — End: ?

## 2021-03-19 MED ORDER — ACETAMINOPHEN 325 MG PO TABS: 325.0000 mg | ORAL_TABLET | Freq: Four times a day (QID) | ORAL | Status: DC | PRN

## 2021-03-19 MED ORDER — BISMUTH SUBSALICYLATE 262 MG PO CHEW: 524.0000 mg | CHEWABLE_TABLET | Freq: Four times a day (QID) | ORAL | Status: DC

## 2021-03-19 MED ORDER — CEFAZOLIN SODIUM-DEXTROSE 2-4 GM/100ML-% IV SOLN
2.0000 g | Freq: Four times a day (QID) | INTRAVENOUS | Status: AC
  Administered 2021-03-20 (×2): 2 g via INTRAVENOUS
  Filled 2021-03-19 (×3): qty 100

## 2021-03-19 MED ORDER — DEXAMETHASONE SODIUM PHOSPHATE 10 MG/ML IJ SOLN
INTRAMUSCULAR | Status: AC
  Filled 2021-03-19: qty 1

## 2021-03-19 MED ORDER — FENTANYL CITRATE PF 50 MCG/ML IJ SOSY: 50.0000 ug | PREFILLED_SYRINGE | INTRAMUSCULAR | Status: AC

## 2021-03-19 MED ORDER — DEXAMETHASONE SODIUM PHOSPHATE 10 MG/ML IJ SOLN
10.0000 mg | Freq: Two times a day (BID) | INTRAMUSCULAR | Status: AC
  Administered 2021-03-19 – 2021-03-20 (×2): 10 mg via INTRAVENOUS
  Filled 2021-03-19 (×2): qty 1

## 2021-03-19 MED ORDER — DEXAMETHASONE SODIUM PHOSPHATE 10 MG/ML IJ SOLN
INTRAMUSCULAR | Status: DC | PRN
Start: 2021-03-19 — End: 2021-03-19
  Administered 2021-03-19: 10 mg via INTRAVENOUS

## 2021-03-19 MED ORDER — MEPERIDINE HCL 25 MG/ML IJ SOLN
12.5000 mg | INTRAMUSCULAR | Status: DC | PRN
  Administered 2021-03-19 – 2021-03-20 (×2): 12.5 mg via INTRAVENOUS
  Filled 2021-03-19 (×2): qty 1

## 2021-03-19 MED ORDER — BUPIVACAINE LIPOSOME 1.3 % IJ SUSP
INTRAMUSCULAR | Status: AC
  Filled 2021-03-19: qty 20

## 2021-03-19 MED ORDER — BUPIVACAINE LIPOSOME 1.3 % IJ SUSP
INTRAMUSCULAR | Status: DC | PRN
  Administered 2021-03-19: 20 mL

## 2021-03-19 MED ORDER — POLYETHYLENE GLYCOL 3350 17 G PO PACK: 17.0000 g | PACK | Freq: Every day | ORAL | Status: DC | PRN

## 2021-03-19 MED ORDER — FENTANYL CITRATE PF 50 MCG/ML IJ SOSY
25.0000 ug | PREFILLED_SYRINGE | INTRAMUSCULAR | Status: DC | PRN
  Administered 2021-03-19: 50 ug via INTRAVENOUS
  Administered 2021-03-19: 25 ug via INTRAVENOUS
  Administered 2021-03-19: 50 ug via INTRAVENOUS
  Administered 2021-03-19: 25 ug via INTRAVENOUS

## 2021-03-19 MED ORDER — HYDROMORPHONE HCL 1 MG/ML IJ SOLN: 0.2500 mg | INTRAMUSCULAR | Status: DC | PRN

## 2021-03-19 MED ORDER — TRANEXAMIC ACID-NACL 1000-0.7 MG/100ML-% IV SOLN
1000.0000 mg | INTRAVENOUS | Status: AC
  Administered 2021-03-19: 1000 mg via INTRAVENOUS
  Filled 2021-03-19: qty 100

## 2021-03-19 MED ORDER — FENTANYL CITRATE PF 50 MCG/ML IJ SOSY
PREFILLED_SYRINGE | INTRAMUSCULAR | Status: AC
  Filled 2021-03-19: qty 3

## 2021-03-19 MED ORDER — MIDAZOLAM HCL 2 MG/2ML IJ SOLN: 1.0000 mg | INTRAMUSCULAR | Status: AC

## 2021-03-19 MED ORDER — LIDOCAINE 2% (20 MG/ML) 5 ML SYRINGE
INTRAMUSCULAR | Status: DC | PRN
  Administered 2021-03-19: 80 mg via INTRAVENOUS

## 2021-03-19 MED ORDER — METHOCARBAMOL 500 MG IVPB - SIMPLE MED
INTRAVENOUS | Status: AC
  Administered 2021-03-19: 500 mg via INTRAVENOUS
  Filled 2021-03-19: qty 50

## 2021-03-19 MED ORDER — CEFAZOLIN SODIUM-DEXTROSE 2-4 GM/100ML-% IV SOLN
2.0000 g | INTRAVENOUS | Status: AC
  Administered 2021-03-19: 2 g via INTRAVENOUS
  Filled 2021-03-19: qty 100

## 2021-03-19 MED ORDER — MIDAZOLAM HCL 2 MG/2ML IJ SOLN
INTRAMUSCULAR | Status: AC
  Filled 2021-03-19: qty 2

## 2021-03-19 MED ORDER — MIDAZOLAM HCL 2 MG/2ML IJ SOLN
INTRAMUSCULAR | Status: AC
  Administered 2021-03-19: 2 mg via INTRAVENOUS
  Filled 2021-03-19: qty 2

## 2021-03-19 MED ORDER — OXYCODONE HCL 5 MG PO TABS
ORAL_TABLET | ORAL | Status: AC
  Filled 2021-03-19: qty 2

## 2021-03-19 MED ORDER — FENTANYL CITRATE (PF) 100 MCG/2ML IJ SOLN
INTRAMUSCULAR | Status: AC
  Filled 2021-03-19: qty 2

## 2021-03-19 MED ORDER — SODIUM CHLORIDE 0.9 % IR SOLN
Status: DC | PRN
  Administered 2021-03-19: 1000 mL

## 2021-03-19 MED ORDER — WATER FOR IRRIGATION, STERILE IR SOLN
Status: DC | PRN
  Administered 2021-03-19: 2000 mL

## 2021-03-19 MED ORDER — PROPOFOL 10 MG/ML IV BOLUS
INTRAVENOUS | Status: DC | PRN
Start: 2021-03-19 — End: 2021-03-19
  Administered 2021-03-19: 150 mg via INTRAVENOUS

## 2021-03-19 MED ORDER — FENTANYL CITRATE PF 50 MCG/ML IJ SOSY
PREFILLED_SYRINGE | INTRAMUSCULAR | Status: AC
  Administered 2021-03-19: 50 ug via INTRAVENOUS
  Filled 2021-03-19: qty 2

## 2021-03-19 MED ORDER — 0.9 % SODIUM CHLORIDE (POUR BTL) OPTIME
TOPICAL | Status: DC | PRN
  Administered 2021-03-19: 1000 mL

## 2021-03-19 MED ORDER — TRANEXAMIC ACID-NACL 1000-0.7 MG/100ML-% IV SOLN
1000.0000 mg | Freq: Once | INTRAVENOUS | Status: AC
  Administered 2021-03-19: 1000 mg via INTRAVENOUS
  Filled 2021-03-19: qty 100

## 2021-03-19 MED ORDER — FENTANYL CITRATE (PF) 100 MCG/2ML IJ SOLN
INTRAMUSCULAR | Status: DC | PRN
  Administered 2021-03-19 (×2): 50 ug via INTRAVENOUS
  Administered 2021-03-19 (×4): 25 ug via INTRAVENOUS

## 2021-03-19 MED ORDER — ONDANSETRON HCL 4 MG/2ML IJ SOLN: 4.0000 mg | Freq: Four times a day (QID) | INTRAMUSCULAR | Status: DC | PRN

## 2021-03-19 MED ORDER — MENTHOL 3 MG MT LOZG: 1.0000 | LOZENGE | OROMUCOSAL | Status: DC | PRN

## 2021-03-19 MED ORDER — LACTATED RINGERS IV SOLN: INTRAVENOUS | Status: DC

## 2021-03-19 MED ORDER — PROPOFOL 10 MG/ML IV BOLUS
INTRAVENOUS | Status: AC
  Filled 2021-03-19: qty 20

## 2021-03-19 MED ORDER — APIXABAN 5 MG PO TABS
5.0000 mg | ORAL_TABLET | Freq: Two times a day (BID) | ORAL | Status: DC
  Administered 2021-03-20: 5 mg via ORAL
  Filled 2021-03-19: qty 1

## 2021-03-19 MED ORDER — DIPHENHYDRAMINE HCL 12.5 MG/5ML PO ELIX
12.5000 mg | ORAL_SOLUTION | ORAL | Status: DC | PRN
  Administered 2021-03-19 (×2): 12.5 mg via ORAL
  Filled 2021-03-19: qty 10

## 2021-03-19 MED ORDER — SODIUM CHLORIDE 0.9 % IV SOLN: INTRAVENOUS | Status: DC

## 2021-03-19 MED ORDER — ALUM & MAG HYDROXIDE-SIMETH 200-200-20 MG/5ML PO SUSP: 30.0000 mL | ORAL | Status: DC | PRN

## 2021-03-19 MED ORDER — DICYCLOMINE HCL 20 MG PO TABS
20.0000 mg | ORAL_TABLET | Freq: Three times a day (TID) | ORAL | Status: DC
  Administered 2021-03-20 (×2): 20 mg via ORAL
  Filled 2021-03-19 (×3): qty 1

## 2021-03-19 MED ORDER — TIZANIDINE HCL 2 MG PO TABS
2.0000 mg | ORAL_TABLET | Freq: Three times a day (TID) | ORAL | 0 refills | Status: AC | PRN
Start: 2021-03-19 — End: ?

## 2021-03-19 MED ORDER — PANTOPRAZOLE SODIUM 40 MG PO TBEC
40.0000 mg | DELAYED_RELEASE_TABLET | Freq: Every day | ORAL | Status: DC
  Administered 2021-03-20: 40 mg via ORAL
  Filled 2021-03-19: qty 1

## 2021-03-19 MED ORDER — METHOCARBAMOL 500 MG PO TABS
500.0000 mg | ORAL_TABLET | Freq: Four times a day (QID) | ORAL | Status: DC | PRN
  Administered 2021-03-20 (×2): 500 mg via ORAL
  Filled 2021-03-19 (×2): qty 1

## 2021-03-19 MED ORDER — BUPIVACAINE LIPOSOME 1.3 % IJ SUSP: 20.0000 mL | Freq: Once | INTRAMUSCULAR | Status: DC

## 2021-03-19 MED ORDER — METHOCARBAMOL 500 MG IVPB - SIMPLE MED
500.0000 mg | Freq: Four times a day (QID) | INTRAVENOUS | Status: DC | PRN
  Filled 2021-03-19: qty 50

## 2021-03-19 MED ORDER — SODIUM CHLORIDE 0.9% FLUSH
INTRAVENOUS | Status: DC | PRN
  Administered 2021-03-19: 50 mL

## 2021-03-19 MED ORDER — BISACODYL 5 MG PO TBEC: 5.0000 mg | DELAYED_RELEASE_TABLET | Freq: Every day | ORAL | Status: DC | PRN

## 2021-03-19 MED ORDER — BUPIVACAINE-EPINEPHRINE 0.5% -1:200000 IJ SOLN
INTRAMUSCULAR | Status: DC | PRN
  Administered 2021-03-19: 30 mL

## 2021-03-19 MED ORDER — DOCUSATE SODIUM 100 MG PO CAPS
100.0000 mg | ORAL_CAPSULE | Freq: Two times a day (BID) | ORAL | Status: DC
  Administered 2021-03-19 – 2021-03-20 (×2): 100 mg via ORAL
  Filled 2021-03-19 (×2): qty 1

## 2021-03-19 MED ORDER — ONDANSETRON HCL 4 MG PO TABS: 4.0000 mg | ORAL_TABLET | Freq: Four times a day (QID) | ORAL | Status: DC | PRN

## 2021-03-19 MED ORDER — OXYCODONE HCL 5 MG PO TABS
5.0000 mg | ORAL_TABLET | ORAL | Status: DC | PRN
  Administered 2021-03-19 – 2021-03-20 (×4): 10 mg via ORAL
  Filled 2021-03-19 (×3): qty 2

## 2021-03-19 MED ORDER — SERTRALINE HCL 50 MG PO TABS
50.0000 mg | ORAL_TABLET | Freq: Every day | ORAL | Status: DC
  Administered 2021-03-20: 50 mg via ORAL
  Filled 2021-03-19: qty 1

## 2021-03-19 MED ORDER — METRONIDAZOLE 500 MG PO TABS: 250.0000 mg | ORAL_TABLET | Freq: Four times a day (QID) | ORAL | Status: DC

## 2021-03-19 MED ORDER — ORAL CARE MOUTH RINSE: 15.0000 mL | Freq: Once | OROMUCOSAL | Status: AC

## 2021-03-19 MED ORDER — BUPIVACAINE-EPINEPHRINE (PF) 0.25% -1:200000 IJ SOLN
INTRAMUSCULAR | Status: AC
  Filled 2021-03-19: qty 30

## 2021-03-19 MED ORDER — ONDANSETRON HCL 4 MG/2ML IJ SOLN
INTRAMUSCULAR | Status: DC | PRN
  Administered 2021-03-19: 4 mg via INTRAVENOUS

## 2021-03-19 MED ORDER — ROPIVACAINE HCL 7.5 MG/ML IJ SOLN
INTRAMUSCULAR | Status: DC | PRN
  Administered 2021-03-19: 20 mL via PERINEURAL

## 2021-03-19 MED ORDER — ONDANSETRON HCL 4 MG/2ML IJ SOLN: 4.0000 mg | Freq: Once | INTRAMUSCULAR | Status: DC | PRN

## 2021-03-19 MED ORDER — EPHEDRINE SULFATE-NACL 50-0.9 MG/10ML-% IV SOSY
PREFILLED_SYRINGE | INTRAVENOUS | Status: DC | PRN
  Administered 2021-03-19: 10 mg via INTRAVENOUS
  Administered 2021-03-19: 5 mg via INTRAVENOUS

## 2021-03-19 MED ORDER — ARIPIPRAZOLE 5 MG PO TABS
2.5000 mg | ORAL_TABLET | Freq: Every day | ORAL | Status: DC
  Administered 2021-03-19: 2.5 mg via ORAL
  Filled 2021-03-19 (×2): qty 1

## 2021-03-19 MED ORDER — PHENOL 1.4 % MT LIQD: 1.0000 | OROMUCOSAL | Status: DC | PRN

## 2021-03-19 MED ORDER — DOXYCYCLINE HYCLATE 50 MG PO CAPS: 50.0000 mg | ORAL_CAPSULE | Freq: Two times a day (BID) | ORAL | Status: DC

## 2021-03-19 MED ORDER — PHENYLEPHRINE 40 MCG/ML (10ML) SYRINGE FOR IV PUSH (FOR BLOOD PRESSURE SUPPORT)
PREFILLED_SYRINGE | INTRAVENOUS | Status: DC | PRN
  Administered 2021-03-19 (×4): 80 ug via INTRAVENOUS
  Administered 2021-03-19 (×2): 120 ug via INTRAVENOUS

## 2021-03-19 MED ORDER — TRAZODONE HCL 50 MG PO TABS: 50.0000 mg | ORAL_TABLET | Freq: Every day | ORAL | Status: DC

## 2021-03-19 SURGICAL SUPPLY — 59 items
APL SKNCLS STERI-STRIP NONHPOA (GAUZE/BANDAGES/DRESSINGS) ×1
ATTUNE MED DOME PAT 32 KNEE (Knees) ×1 IMPLANT
ATTUNE PSFEM RTSZ5 NARCEM KNEE (Femur) ×1 IMPLANT
ATTUNE PSRP INSR SZ5 6 KNEE (Insert) ×1 IMPLANT
BAG COUNTER SPONGE SURGICOUNT (BAG) ×1 IMPLANT
BAG DECANTER FOR FLEXI CONT (MISCELLANEOUS) ×1 IMPLANT
BAG SPEC THK2 15X12 ZIP CLS (MISCELLANEOUS) ×1
BAG SPNG CNTER NS LX DISP (BAG) ×1
BAG ZIPLOCK 12X15 (MISCELLANEOUS) ×3 IMPLANT
BASEPLATE TIBIAL ROTATING SZ 4 (Knees) ×1 IMPLANT
BENZOIN TINCTURE PRP APPL 2/3 (GAUZE/BANDAGES/DRESSINGS) ×3 IMPLANT
BLADE SAGITTAL 25.0X1.19X90 (BLADE) ×3 IMPLANT
BLADE SAW SGTL 13.0X1.19X90.0M (BLADE) ×3 IMPLANT
BLADE SURG SZ10 CARB STEEL (BLADE) ×6 IMPLANT
BNDG ELASTIC 6X5.8 VLCR STR LF (GAUZE/BANDAGES/DRESSINGS) ×3 IMPLANT
BOOTIES KNEE HIGH SLOAN (MISCELLANEOUS) ×2 IMPLANT
BOWL SMART MIX CTS (DISPOSABLE) ×3 IMPLANT
BSPLAT TIB 4 CMNT ROT PLAT STR (Knees) ×1 IMPLANT
CEMENT HV SMART SET (Cement) ×6 IMPLANT
COVER SURGICAL LIGHT HANDLE (MISCELLANEOUS) ×3 IMPLANT
CUFF TOURN SGL QUICK 34 (TOURNIQUET CUFF) ×2
CUFF TRNQT CYL 34X4.125X (TOURNIQUET CUFF) ×2 IMPLANT
DECANTER SPIKE VIAL GLASS SM (MISCELLANEOUS) ×4 IMPLANT
DRAPE INCISE IOBAN 66X45 STRL (DRAPES) ×3 IMPLANT
DRAPE U-SHAPE 47X51 STRL (DRAPES) ×3 IMPLANT
DRSG AQUACEL AG ADV 3.5X10 (GAUZE/BANDAGES/DRESSINGS) ×3 IMPLANT
DURAPREP 26ML APPLICATOR (WOUND CARE) ×3 IMPLANT
ELECT REM PT RETURN 15FT ADLT (MISCELLANEOUS) ×3 IMPLANT
FACESHIELD WRAPAROUND (MASK) ×4 IMPLANT
FACESHIELD WRAPAROUND OR TEAM (MASK) IMPLANT
GLOVE SRG 8 PF TXTR STRL LF DI (GLOVE) ×4 IMPLANT
GLOVE SURG NEOP MICRO LF SZ7.5 (GLOVE) ×6 IMPLANT
GLOVE SURG UNDER POLY LF SZ8 (GLOVE) ×4
GOWN STRL REUS W/TWL XL LVL3 (GOWN DISPOSABLE) ×6 IMPLANT
HANDPIECE INTERPULSE COAX TIP (DISPOSABLE) ×2
HOLDER FOLEY CATH W/STRAP (MISCELLANEOUS) IMPLANT
HOOD PEEL AWAY FLYTE STAYCOOL (MISCELLANEOUS) ×9 IMPLANT
KIT TURNOVER KIT A (KITS) IMPLANT
MANIFOLD NEPTUNE II (INSTRUMENTS) ×3 IMPLANT
NEEDLE HYPO 22GX1.5 SAFETY (NEEDLE) ×6 IMPLANT
NS IRRIG 1000ML POUR BTL (IV SOLUTION) ×3 IMPLANT
PACK TOTAL KNEE CUSTOM (KITS) ×3 IMPLANT
PADDING CAST COTTON 6X4 STRL (CAST SUPPLIES) ×3 IMPLANT
PROTECTOR NERVE ULNAR (MISCELLANEOUS) ×3 IMPLANT
SET HNDPC FAN SPRY TIP SCT (DISPOSABLE) ×2 IMPLANT
SLEEVE SCD COMPRESS KNEE MED (STOCKING) ×1 IMPLANT
SPONGE T-LAP 18X18 ~~LOC~~+RFID (SPONGE) ×8 IMPLANT
STRIP CLOSURE SKIN 1/2X4 (GAUZE/BANDAGES/DRESSINGS) ×2 IMPLANT
SUT MNCRL AB 3-0 PS2 18 (SUTURE) ×3 IMPLANT
SUT VIC AB 0 CT1 36 (SUTURE) ×3 IMPLANT
SUT VIC AB 1 CT1 36 (SUTURE) ×6 IMPLANT
SUT VIC AB 2-0 CT1 27 (SUTURE) ×2
SUT VIC AB 2-0 CT1 TAPERPNT 27 (SUTURE) IMPLANT
SYR CONTROL 10ML LL (SYRINGE) ×6 IMPLANT
TRAY FOLEY MTR SLVR 16FR STAT (SET/KITS/TRAYS/PACK) ×2 IMPLANT
TUBE SUCTION HIGH CAP CLEAR NV (SUCTIONS) ×1 IMPLANT
WATER STERILE IRR 1000ML POUR (IV SOLUTION) ×6 IMPLANT
WRAP KNEE MAXI GEL POST OP (GAUZE/BANDAGES/DRESSINGS) ×3 IMPLANT
YANKAUER SUCT BULB TIP 10FT TU (MISCELLANEOUS) ×1 IMPLANT

## 2021-03-19 NOTE — Transfer of Care (Signed)
Immediate Anesthesia Transfer of Care Note  Patient: Brittney Carter  Procedure(s) Performed: TOTAL KNEE ARTHROPLASTY (Right: Knee)  Patient Location: PACU  Anesthesia Type:GA combined with regional for post-op pain  Level of Consciousness: awake, alert , oriented, drowsy and patient cooperative  Airway & Oxygen Therapy: Patient Spontanous Breathing and Patient connected to face mask oxygen  Post-op Assessment: Report given to RN and Post -op Vital signs reviewed and stable  Post vital signs: Reviewed and stable  Last Vitals:  Vitals Value Taken Time  BP 133/87 03/19/21 1752  Temp    Pulse 85 03/19/21 1754  Resp 13 03/19/21 1754  SpO2 100 % 03/19/21 1754  Vitals shown include unvalidated device data.  Last Pain:  Vitals:   03/19/21 1445  TempSrc:   PainSc: 0-No pain      Patients Stated Pain Goal: 5 (03/19/21 1344)  Complications: No notable events documented.

## 2021-03-19 NOTE — Anesthesia Postprocedure Evaluation (Signed)
Anesthesia Post Note  Patient: Brittney Carter  Procedure(s) Performed: TOTAL KNEE ARTHROPLASTY (Right: Knee)     Patient location during evaluation: PACU Anesthesia Type: General Level of consciousness: awake and alert and oriented Pain management: pain level controlled Vital Signs Assessment: post-procedure vital signs reviewed and stable Respiratory status: spontaneous breathing, nonlabored ventilation and respiratory function stable Cardiovascular status: blood pressure returned to baseline and stable Postop Assessment: no apparent nausea or vomiting Anesthetic complications: no   No notable events documented.  Last Vitals:  Vitals:   03/19/21 1800 03/19/21 1815  BP: 120/83 131/85  Pulse: 79 80  Resp: 10 14  Temp:    SpO2: 98% 98%    Last Pain:  Vitals:   03/19/21 1828  TempSrc:   PainSc: 7                  Otillia Cordone A.

## 2021-03-19 NOTE — Op Note (Signed)
PATIENT ID:      Brittney Carter  MRN:     [id number] DOB/AGE:    Oct 29, 1960 / 61 y.o.       OPERATIVE REPORT   DATE OF PROCEDURE:  03/19/2021      PREOPERATIVE DIAGNOSIS:   RIGHT TOTAL KNEE ARTHROPLASTY      Estimated body mass index is 35.05 kg/m as calculated from the following:   Height as of this encounter: 5' 1"  (1.549 m).   Weight as of this encounter: 84.1 kg.                                                       POSTOPERATIVE DIAGNOSIS:   Same                                                                  PROCEDURE:  Procedure(s): TOTAL KNEE ARTHROPLASTY Using DepuyAttune RP implants #5N Femur, #4Tibia, 6 mm Attune RP bearing, 32 Patella    SURGEON: Alta Corning  ASSISTANT:   Nehemiah Massed PA-C   (Present and scrubbed throughout the case, critical for assistance with exposure, retraction, instrumentation, and closure.)        ANESTHESIA: spinal, 20cc Exparel, 50cc 0.25% Marcaine EBL: min cc FLUID REPLACEMENT: unk cc crystaloid TOURNIQUET: DRAINS: None TRANEXAMIC ACID: 1gm IV, 2gm topical COMPLICATIONS:  None         INDICATIONS FOR PROCEDURE: The patient has  RIGHT TOTAL KNEE ARTHROPLASTY, mild varus deformities, XR shows bone on bone arthritis, lateral subluxation of tibia. Patient has failed all conservative measures including anti-inflammatory medicines, narcotics, attempts at exercise and weight loss, cortisone injections and viscosupplementation.  Risks and benefits of surgery have been discussed, questions answered.   DESCRIPTION OF PROCEDURE: The patient identified by armband, received  IV antibiotics, in the holding area at [facility]. Patient taken to the operating room, appropriate anesthetic monitors were attached, and spinal anesthesia was  induced. IV Tranexamic acid was given.Tourniquet applied high to the operative thigh. Lateral post and foot positioner applied to the table, the lower extremity was then prepped and draped in usual sterile fashion  from the toes to the tourniquet. Time-out procedure was performed. BlairRoberts PAC, was present and scrubbed throughout the case, critical for assistance with, positioning, exposure, retraction, instrumentation, and closure.The skin and subcutaneous tissue along the incision was injected with 20 cc of a mixture of Exparel and Marcaine solution, using a 20-gauge by 1-1/2 inch needle. We began the operation, with the knee flexed 130 degrees, by making the anterior midline incision starting at handbreadth above the patella going over the patella 1 cm medial to and 4 cm distal to the tibial tubercle. Small bleeders in the skin and the subcutaneous tissue identified and cauterized. Transverse retinaculum was incised and reflected medially and a medial parapatellar arthrotomy was accomplished. the patella was everted and theprepatellar fat pad resected. The superficial medial collateral ligament was then elevated from anterior to posterior along the proximal flare of the tibia and anterior half of the menisci resected. The knee was hyperflexed exposing bone on bone arthritis. Peripheral and  notch osteophytes as well as the cruciate ligaments were then resected. We continued to work our way around posteriorly along the proximal tibia, and externally rotated the tibia subluxing it out from underneath the femur. A McHale PCL retractor was placed through the notch and a lateral Hohmann retractor placed, and we then entered the proximal tibia in line with the Depuy starter drill in line with the axis of the tibia followed by an intramedullary guide rod and 0-degree posterior slope cutting guide. The tibial cutting guide, 4 degree posterior sloped, was pinned into place allowing resection of 2 mm of bone medially and 10 mm of bone laterally. Satisfied with the tibial resection, we then entered the distal femur 2 mm anterior to the PCL origin with the intramedullary guide rod and applied the distal femoral cutting guide set at  9 mm, with 5 degrees of valgus. This was pinned along the epicondylar axis. At this point, the distal femoral cut was accomplished without difficulty. We then sized for a #5 femoral component and pinned the guide in 3 degrees of external rotation. The chamfer cutting guide was pinned into place. The anterior, posterior, and chamfer cuts were accomplished without difficulty followed by the Attune RP box cutting guide and the box cut. We also removed posterior osteophytes from the posterior femoral condyles. The posterior capsule was injected with Exparel solution. The knee was brought into full extension. We checked our extension gap and fit a 6 mm bearing. Distracting in extension with a lamina spreader,  bleeders in the posterior capsule, Posterior medial and posterior lateral gutter were cauterized.  The transexamic acid-soaked sponge was then placed in the gap of the knee in extension. The knee was flexed 30. The posterior patella cut was accomplished with the 9.5 mm Attune cutting guide, sized for a 12m dome, and the fixation pegs drilled.The knee was then once again hyperflexed exposing the proximal tibia. We sized for a # 4 tibial base plate, applied the smokestack and the conical reamer followed by the the Delta fin keel punch. We then hammered into place the Attune RP trial femoral component, drilled the lugs, inserted a  6 mm trial bearing, trial patellar button, and took the knee through range of motion from 0-130 degrees. Medial and lateral ligamentous stability was checked. No thumb pressure was required for patellar Tracking. The tourniquet was released at 50 min. All trial components were removed, mating surfaces irrigated with pulse lavage, and dried with suction and sponges. 10 cc of the Exparel solution was applied to the cancellus bone of the patella distal femur and proximal tibia.  After waiting 30 seconds, the bony surfaces were again, dried with sponges. A double batch of DePuy HV cement was  mixed and applied to all bony metallic mating surfaces except for the posterior condyles of the femur itself. In order, we hammered into place the tibial tray and removed excess cement, the femoral component and removed excess cement. The final Attune RP bearing was inserted, and the knee brought to full extension with compression. The patellar button was clamped into place, and excess cement removed. The knee was held at 30 flexion with compression, while the cement cured. The wound was irrigated out with normal saline solution pulse lavage. The rest of the Exparel was injected into the parapatellar arthrotomy, subcutaneous tissues, and periosteal tissues. The parapatellar arthrotomy was closed with running #1 Vicryl suture. The subcutaneous tissue with 3-0 undyed Vicryl suture, and the skin with running 3-0 SQ vicryl. An Aquacil  and Ace wrap were applied. The patient was taken to recovery room without difficulty.   Alta Corning 03/19/2021, 5:19 PM

## 2021-03-19 NOTE — H&P (Signed)
TOTAL KNEE ADMISSION H&P  Patient is being admitted for right total knee arthroplasty.  Subjective:  Chief Complaint:right knee pain.  HPI: Brittney Carter, 61 y.o. female, has a history of pain and functional disability in the right knee due to arthritis and has failed non-surgical conservative treatments for greater than 12 weeks to includeNSAID's and/or analgesics, corticosteriod injections, viscosupplementation injections, weight reduction as appropriate, and activity modification.  Onset of symptoms was gradual, starting 1 years ago with rapidlly worsening course since that time. The patient noted prior procedures on the knee to include  arthroscopy and menisectomy on the right knee(s).  Patient currently rates pain in the right knee(s) at 10 out of 10 with activity. Patient has night pain, worsening of pain with activity and weight bearing, pain that interferes with activities of daily living, pain with passive range of motion, and joint swelling.  Patient has evidence of joint space narrowing by imaging studies. 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 There is no active infection.  Patient Active Problem List   Diagnosis Date Noted   Esophageal dysphagia    Screening for colon cancer    Lower GI bleed    History of pulmonary embolus (PE) 11/26/2020   Constipation 11/26/2020   Hypotension 11/26/2020   H/O right knee surgery 11/26/2020   Hematochezia    Family history of colon cancer    Lower abdominal pain    Rectal bleeding 11/25/2020   Suicidal thoughts 09/01/2020   Acute deep vein thrombosis (DVT) of distal vein of right lower extremity (HCC)    Acute pulmonary embolism ([place]) 08/04/2020   Back pain of thoracolumbar region 10/17/2017   Long-term current use of opiate analgesic 03/28/2017   Chronic  pain 11/24/2016   Cervical radiculopathy at C7 12/03/2015   Sacroiliac joint pain 12/03/2015   Chronic low back pain 11/25/2014   Segmental and somatic dysfunction of pelvic region 10/28/2014   Disc degeneration, lumbar 08/12/2014   Lumbar radiculopathy 08/12/2014   Lumbar stenosis 08/12/2014   Osteoarthritis of spine with radiculopathy, lumbar region 08/12/2014   Past Medical History:  Diagnosis Date   Anxiety    Arthritis    Back   Depression    no medication   DVT (deep venous thrombosis) ([place])    Dyspnea    with exertion     Past Surgical History:  Procedure Laterality Date   BACK SURGERY  2016   Laminectomy   BACK SURGERY  1991   Disectomy   BIOPSY  01/25/2021   Procedure: BIOPSY;  Surgeon: Jackquline Denmark, MD;  Location: [facility];  Service: Gastroenterology;;   BREAST SURGERY     Breast lift   COLONOSCOPY  10/14/2016   Dr Orlena Sheldon. Benign neoplasm of ascending colon. Anal canal bleeding   COLONOSCOPY W/ POLYPECTOMY     x 2   COLONOSCOPY WITH PROPOFOL N/A 01/25/2021   Procedure: COLONOSCOPY WITH PROPOFOL;  Surgeon: Jackquline Denmark, MD;  Location: [facility];  Service: Gastroenterology;  Laterality: N/A;   ent surgery     ESOPHAGOGASTRODUODENOSCOPY  10/14/2016   Dr Orlena Sheldon. Normal EGD to second portion of duodenum   ESOPHAGOGASTRODUODENOSCOPY (EGD) WITH PROPOFOL N/A 01/25/2021   Procedure: ESOPHAGOGASTRODUODENOSCOPY (EGD) WITH PROPOFOL;  Surgeon: Jackquline Denmark, MD;  Location: [facility];  Service: Gastroenterology;  Laterality: N/A;   MALONEY DILATION  01/25/2021   Procedure: Venia Minks DILATION;  Surgeon: [name],  Peyton Bottoms, MD;  Location: [facility];  Service: Gastroenterology;;   Pain stimulator trail     POLYPECTOMY  01/25/2021   Procedure: POLYPECTOMY;  Surgeon: Jackquline Denmark, MD;  Location: [facility];  Service: Gastroenterology;;   SPINAL CORD STIMULATOR INSERTION N/A 11/24/2016   Procedure: LUMBAR SPINAL CORD STIMULATOR INSERTION;  Surgeon: Melina Schools, MD;   Location: [facility];  Service: Orthopedics;  Laterality: N/A;  2.5 hrs   SPINAL CORD STIMULATOR REMOVAL N/A 06/28/2017   Procedure: Spinal cord stimulator battery removal;  Surgeon: Melina Schools, MD;  Location: [facility];  Service: Orthopedics;  Laterality: N/A;   TONSILLECTOMY     age 20   TUBAL LIGATION      No current facility-administered medications for this encounter.   Current Outpatient Medications  Medication Sig Dispense Refill Last Dose   ARIPiprazole (ABILIFY) 5 MG tablet Take 2.5 mg by mouth at bedtime.      Carboxymethylcellulose Sodium (THERATEARS) 0.25 % SOLN Place 1 drop into both eyes daily as needed (dry eyes).      diclofenac Sodium (VOLTAREN) 1 % GEL Apply 1 application topically 3 (three) times daily as needed (knee pain).      sertraline (ZOLOFT) 50 MG tablet Take 50 mg by mouth daily.      traZODone (DESYREL) 50 MG tablet Take 50 mg by mouth at bedtime.      apixaban (ELIQUIS) 5 MG TABS tablet Take 5 mg by mouth 2 (two) times daily. (Patient not taking: Reported on 03/05/2021)   Not Taking   bisacodyl (DULCOLAX) 5 MG EC tablet Take 1 tablet (5 mg total) by mouth daily as needed for moderate constipation. (Patient not taking: Reported on 02/13/2021) 30 tablet 1    bismuth subsalicylate (PEPTO-BISMOL) 262 MG chewable tablet Chew 2 tablets (524 mg total) by mouth 4 (four) times daily for 14 days. 112 tablet 0    cyclobenzaprine (FLEXERIL) 10 MG tablet Take 1 tablet (10 mg total) by mouth 2 (two) times daily as needed for muscle spasms. (Patient not taking: Reported on 01/19/2021) 20 tablet 0    dicyclomine (BENTYL) 20 MG tablet Take 1 tablet (20 mg total) by mouth 3 (three) times daily before meals. (Patient not taking: Reported on 01/19/2021) 90 tablet 0    doxycycline (VIBRAMYCIN) 50 MG capsule Take 2 capsules (100 mg total) by mouth 2 (two) times daily for 14 days. 56 capsule 0    hydrocortisone cream 1 % Apply topically 3 (three) times daily. (Patient not taking: Reported on  01/19/2021) 30 g 0    methocarbamol (ROBAXIN) 500 MG tablet Take 1 tablet (500 mg total) by mouth every 8 (eight) hours as needed for muscle spasms. (Patient not taking: Reported on 12/25/2020) 60 tablet 0    metroNIDAZOLE (FLAGYL) 250 MG tablet Take 1 tablet (250 mg total) by mouth QID for 14 days. 56 tablet 0    omeprazole (PRILOSEC) 20 MG capsule TAKE 1 CAPSULE(20 MG) BY MOUTH DAILY (Patient taking differently: 20 mg 2 (two) times daily before a meal.) 90 capsule 1    omeprazole (PRILOSEC) 20 MG capsule Take 1 capsule (20 mg total) by mouth 2 (two) times daily before a meal for 14 days. (Patient not taking: Reported on 02/13/2021) 28 capsule 0    omeprazole (PRILOSEC) 20 MG capsule Take 1 capsule (20 mg total) by mouth 2 (two) times daily for 14 days. 28 capsule 0    polyethylene glycol (MIRALAX / GLYCOLAX) 17 g packet Take 17 g by mouth daily.  Can take up to twice a day for constipation. (Patient not taking: Reported on 01/19/2021) 30 each 3    Allergies  Allergen Reactions   Gabapentin Shortness Of Breath and Rash   Hydrocodone-Ibuprofen Hives, Shortness Of Breath, Nausea And Vomiting and Swelling    Severe Headache    Morphine Anaphylaxis, Hives, Shortness Of Breath and Rash   Tramadol Anaphylaxis, Swelling and Rash    headache    Social History   Tobacco Use   Smoking status: Never   Smokeless tobacco: Never  Substance Use Topics   Alcohol use: No    Family History  Problem Relation Age of Onset   Breast cancer Sister    Colon cancer Brother        died at 13   Rectal cancer Neg Hx    Esophageal cancer Neg Hx    Stomach cancer Neg Hx      Review of Systems ROS Objective:  Physical Exam  Vital signs in last 24 hours:   Well-developed well-nourished patient in no acute distress. Alert and oriented x3 HEENT:within normal limits Cardiac: Regular rate and rhythm Pulmonary: Lungs clear to auscultation Abdomen: Soft and nontender.  Normal active bowel  sounds  Musculoskeletal: (Right knee: Painful range of motion.  Limited range of motion.  1+ effusion.  No instability.  No erythema or warmth. Labs: Recent Results (from the past 2160 hour(s))  Surgical pathology     Status: None   Collection Time: 01/25/21 10:26 AM  Result Value Ref Range   SURGICAL PATHOLOGY      SURGICAL PATHOLOGY CASE: [id number] PATIENT: Brittney Carter Surgical Pathology Report     Clinical History: Screening for colon cancer; r/o celiac disease, r/o h. pylori, r/o EOE ([facility])     FINAL MICROSCOPIC DIAGNOSIS:  A. SMALL BOWEL, BIOPSY: - Duodenal mucosa with no specific histopathologic changes - Negative for increased intraepithelial lymphocytes or villous architectural changes  B. STOMACH, BIOPSY: - Gastric antral mucosa with Helicobacter pylori-associated gastritis (confirmed with Warthin Starry stain)  C. ESOPHAGUS, BIOPSY: - Esophageal squamous mucosa with no specific histopathologic changes - Negative for increased intraepithelial eosinophils  D. RECTUM, POLYPECTOMY: - Hyperplastic polyp(s)      GROSS DESCRIPTION:  A: Received in formalin are tan, soft tissue fragments that are submitted in toto. Number: 4 size: 0.3-0.5 cm blocks: 1  B: Received in formalin are tan, soft tissue fragments that are submitted in toto. Number: 2 siz e: 0.4 and 0.5 cm blocks: 1  C: Received in formalin are tan, soft tissue fragments that are submitted in toto. Number: 6 size: 0.4-0.6 cm blocks: 1  D: Received in formalin are tan, soft tissue fragments that are submitted in toto. Number: 2 size: 0.2 and 0.3 cm blocks: 1 (GRP 01/25/2021)  Final Diagnosis performed by Jaquita Folds, MD.   Electronically signed 01/26/2021 Technical and / or Professional components performed at [facility], [place] [address], [place], [place] [address].  Immunohistochemistry Technical component (if applicable) was performed at [facility]. [address] [place], [place], [place] [address].   IMMUNOHISTOCHEMISTRY DISCLAIMER (if applicable): Some of these immunohistochemical stains may have been developed and the performance characteristics determine by [facility]. Some may not have been cleared or approved by the U.S. Food and Drug Administration. The FDA has determined that such cl earance or approval is not necessary. This test is used for clinical purposes. It should not be regarded as investigational or for research. This laboratory is  certified under the [place] (CLIA-88) as qualified to perform high complexity clinical laboratory testing.  The controls stained appropriately.   Comprehensive metabolic panel     Status: Abnormal   Collection Time: 01/25/21 10:47 AM  Result Value Ref Range   Sodium 140 135 - 145 mmol/L   Potassium 3.3 (L) 3.5 - 5.1 mmol/L   Chloride 107 98 - 111 mmol/L   CO2 28 22 - 32 mmol/L   Glucose, Bld 104 (H) 70 - 99 mg/dL    Comment: Glucose reference range applies only to samples taken after fasting for at least 8 hours.   BUN 8 6 - 20 mg/dL   Creatinine, Ser 0.68 0.44 - 1.00 mg/dL   Calcium 9.1 8.9 - 10.3 mg/dL   Total Protein 6.6 6.5 - 8.1 g/dL   Albumin 3.8 3.5 - 5.0 g/dL   AST 15 15 - 41 U/L   ALT 14 0 - 44 U/L   Alkaline Phosphatase 64 38 - 126 U/L   Total Bilirubin 0.5 0.3 - 1.2 mg/dL   GFR, Estimated >60 >60 mL/min    Comment: (NOTE) Calculated using the CKD-EPI Creatinine Equation (2021)    Anion gap 5 5 - 15    Comment: Performed at [facility], [place] [address], [place], [place] [address]  CBC     Status: Abnormal   Collection Time: 01/25/21 10:47 AM  Result Value Ref Range   WBC 6.8 4.0 - 10.5 K/uL   RBC 3.91 3.87 - 5.11 MIL/uL   Hemoglobin 10.3 (L) 12.0 - 15.0 g/dL   HCT 33.0 (L) 36.0 - 46.0 %   MCV 84.4 80.0 - 100.0 fL   MCH 26.3 26.0 - 34.0 pg   MCHC 31.2 30.0 - 36.0 g/dL   RDW  14.9 11.5 - 15.5 %   Platelets 377 150 - 400 K/uL   nRBC 0.0 0.0 - 0.2 %    Comment: Performed at [facility], [place] [address], [place], [place] [address]  CBC with Differential/Platelet     Status: Abnormal   Collection Time: 02/13/21  7:47 PM  Result Value Ref Range   WBC 7.6 4.0 - 10.5 K/uL   RBC 3.43 (L) 3.87 - 5.11 MIL/uL   Hemoglobin 9.1 (L) 12.0 - 15.0 g/dL   HCT 30.0 (L) 36.0 - 46.0 %   MCV 87.5 80.0 - 100.0 fL   MCH 26.5 26.0 - 34.0 pg   MCHC 30.3 30.0 - 36.0 g/dL   RDW 15.5 11.5 - 15.5 %   Platelets 331 150 - 400 K/uL   nRBC 0.0 0.0 - 0.2 %   Neutrophils Relative % 52 %   Neutro Abs 4.0 1.7 - 7.7 K/uL   Lymphocytes Relative 30 %   Lymphs Abs 2.3 0.7 - 4.0 K/uL   Monocytes Relative 10 %   Monocytes Absolute 0.7 0.1 - 1.0 K/uL   Eosinophils Relative 7 %   Eosinophils Absolute 0.5 0.0 - 0.5 K/uL   Basophils Relative 1 %   Basophils Absolute 0.1 0.0 - 0.1 K/uL   Immature Granulocytes 0 %   Abs Immature Granulocytes 0.03 0.00 - 0.07 K/uL    Comment: Performed at [facility], [place] [address] [place], [place] [address]  Basic metabolic panel     Status: Abnormal   Collection Time: 02/13/21  7:47 PM  Result Value Ref Range   Sodium 140 135 - 145 mmol/L   Potassium 3.6  3.5 - 5.1 mmol/L   Chloride 109 98 - 111 mmol/L   CO2 24 22 - 32 mmol/L   Glucose, Bld 110 (H) 70 - 99 mg/dL    Comment: Glucose reference range applies only to samples taken after fasting for at least 8 hours.   BUN 11 6 - 20 mg/dL   Creatinine, Ser 0.78 0.44 - 1.00 mg/dL   Calcium 8.8 (L) 8.9 - 10.3 mg/dL   GFR, Estimated >60 >60 mL/min    Comment: (NOTE) Calculated using the CKD-EPI Creatinine Equation (2021)    Anion gap 7 5 - 15    Comment: Performed at [facility], [place] [address], [place], [place] [address]  H. pylori antigen, stool     Status: Abnormal   Collection Time: 03/11/21 11:59 AM  Result Value Ref Range   H pylori Ag, Stl  Positive (A) Negative  CBC per protocol     Status: Abnormal   Collection Time: 03/11/21  1:08 PM  Result Value Ref Range   WBC 7.3 4.0 - 10.5 K/uL   RBC 4.27 3.87 - 5.11 MIL/uL   Hemoglobin 10.9 (L) 12.0 - 15.0 g/dL   HCT 35.9 (L) 36.0 - 46.0 %   MCV 84.1 80.0 - 100.0 fL   MCH 25.5 (L) 26.0 - 34.0 pg   MCHC 30.4 30.0 - 36.0 g/dL   RDW 15.6 (H) 11.5 - 15.5 %   Platelets 383 150 - 400 K/uL   nRBC 0.0 0.0 - 0.2 %    Comment: Performed at [facility], [place] [address], [place], [place] [address]  Basic metabolic panel per protocol     Status: Abnormal   Collection Time: 03/11/21  1:08 PM  Result Value Ref Range   Sodium 140 135 - 145 mmol/L   Potassium 4.2 3.5 - 5.1 mmol/L   Chloride 107 98 - 111 mmol/L   CO2 26 22 - 32 mmol/L   Glucose, Bld 111 (H) 70 - 99 mg/dL    Comment: Glucose reference range applies only to samples taken after fasting for at least 8 hours.   BUN 12 6 - 20 mg/dL   Creatinine, Ser 0.77 0.44 - 1.00 mg/dL   Calcium 9.6 8.9 - 10.3 mg/dL   GFR, Estimated >60 >60 mL/min    Comment: (NOTE) Calculated using the CKD-EPI Creatinine Equation (2021)    Anion gap 7 5 - 15    Comment: Performed at [facility], [place] [address], [place],  [address]  Surgical pcr screen     Status: None   Collection Time: 03/11/21  1:08 PM   Specimen: Nasal Mucosa; Nasal Swab  Result Value Ref Range   MRSA, PCR NEGATIVE NEGATIVE   Staphylococcus aureus NEGATIVE NEGATIVE    Comment: (NOTE) The Xpert SA Assay (FDA approved for NASAL specimens in patients 37 years of age and older), is one component of a comprehensive surveillance program. It is not intended to diagnose infection nor to guide or monitor treatment. Performed at [facility], [place] [address], [place], [place] [address]   SARS CORONAVIRUS 2 (TAT 6-24 HRS) Nasopharyngeal Nasopharyngeal Swab     Status: None   Collection Time: 03/17/21  7:12 AM   Specimen:  Nasopharyngeal Swab  Result Value Ref Range   SARS Coronavirus 2 NEGATIVE NEGATIVE    Comment: (NOTE) SARS-CoV-2 target nucleic acids are NOT DETECTED.  The SARS-CoV-2 RNA is generally detectable in upper and lower respiratory specimens during  the acute phase of infection. Negative results do not preclude SARS-CoV-2 infection, do not rule out co-infections with other pathogens, and should not be used as the sole basis for treatment or other patient management decisions. Negative results must be combined with clinical observations, patient history, and epidemiological information. The expected result is Negative.  Fact Sheet for Patients: SugarRoll.be  Fact Sheet for Healthcare Providers: https://www.woods-mathews.com/  This test is not yet approved or cleared by the [place] FDA and  has been authorized for detection and/or diagnosis of SARS-CoV-2 by FDA under an Emergency Use Authorization (EUA). This EUA will remain  in effect (meaning this test can be used) for the duration of the COVID-19 declaration under Se ction 564(b)(1) of the Act, 21 U.S.C. section 360bbb-3(b)(1), unless the authorization is terminated or revoked sooner.  Performed at [facility], [place] [address] [place], [place] [address]      Estimated body mass index is 35.05 kg/m as calculated from the following:   Height as of 03/11/21: 5' 1"  (1.549 m).   Weight as of 03/11/21: 84.1 kg.   Imaging Review Plain radiographs demonstrate severe degenerative joint disease of the right knee(s). The overall alignment isneutral. The bone quality appears to be fair for age and reported activity level.      Assessment/Plan:  End stage arthritis, right knee   The patient history, physical examination, clinical judgment of the provider and imaging studies are consistent with end stage degenerative joint disease of the right knee(s) and total knee arthroplasty is  deemed medically necessary. The treatment options including medical management, injection therapy arthroscopy and arthroplasty were discussed at length. The risks and benefits of total knee arthroplasty were presented and reviewed. The risks due to aseptic loosening, infection, stiffness, patella tracking problems, thromboembolic complications and other imponderables were discussed. The patient acknowledged the explanation, agreed to proceed with the plan and consent was signed. Patient is being admitted for inpatient treatment for surgery, pain control, PT, OT, prophylactic antibiotics, VTE prophylaxis, progressive ambulation and ADL's and discharge planning. The patient is planning to be discharged home with home health services     Patient's anticipated LOS is less than 2 midnights, meeting these requirements: - Younger than 29 - Lives within 1 hour of care - Has a competent adult at home to recover with post-op recover - NO history of  - Chronic pain requiring opiods  - Diabetes  - Coronary Artery Disease  - Heart failure  - Heart attack  - Stroke  - DVT/VTE  - Cardiac arrhythmia  - Respiratory Failure/COPD  - Renal failure  - Anemia  - Advanced Liver disease

## 2021-03-19 NOTE — Progress Notes (Signed)
Orthopedic Tech Progress Note Patient Details:  Brittney Carter 26-Aug-1960 [id number]  CPM Right Knee CPM Right Knee: On Right Knee Flexion (Degrees): 70 Right Knee Extension (Degrees): 0  Post Interventions Patient Tolerated: Well Ortho Devices Type of Ortho Device: Bone foam zero knee Ortho Device/Splint Interventions: Application   Post Interventions Patient Tolerated: Well  [name] 03/19/2021, 6:24 PM

## 2021-03-19 NOTE — Discharge Instructions (Signed)

## 2021-03-19 NOTE — Anesthesia Procedure Notes (Signed)
Procedure Name: LMA Insertion Date/Time: 03/19/2021 3:59 PM Performed by: Pearson Grippe, CRNA Pre-anesthesia Checklist: Patient identified, Emergency Drugs available, Suction available and Patient being monitored Patient Re-evaluated:Patient Re-evaluated prior to induction Oxygen Delivery Method: Circle system utilized Preoxygenation: Pre-oxygenation with 100% oxygen Induction Type: IV induction Ventilation: Mask ventilation without difficulty LMA: LMA inserted LMA Size: 4.0 Number of attempts: 1 Airway Equipment and Method: Bite block Placement Confirmation: positive ETCO2 Tube secured with: Tape Dental Injury: Teeth and Oropharynx as per pre-operative assessment

## 2021-03-19 NOTE — Progress Notes (Signed)
AssistedDr. Foster with right, ultrasound guided, adductor canal block. Side rails up, monitors on throughout procedure. See vital signs in flow sheet. Tolerated Procedure well.  

## 2021-03-19 NOTE — Anesthesia Procedure Notes (Signed)
Anesthesia Regional Block: Adductor canal block   Pre-Anesthetic Checklist: , timeout performed,  Correct Patient, Correct Site, Correct Laterality,  Correct Procedure, Correct Position, site marked,  Risks and benefits discussed,  Surgical consent,  Pre-op evaluation,  At surgeon's request and post-op pain management  Laterality: Left  Prep: chloraprep       Needles:  Injection technique: Single-shot  Needle Type: Echogenic Stimulator Needle     Needle Length: 10cm  Needle Gauge: 21   Needle insertion depth: 6 cm   Additional Needles:   Procedures:,,,, ultrasound used (permanent image in chart),,    Narrative:  Start time: 03/19/2021 2:38 PM End time: 03/19/2021 2:43 PM Injection made incrementally with aspirations every 5 mL.  Performed by: Personally  Anesthesiologist: Mal AmabileFoster, Kresta Templeman, MD  Additional Notes: Timeout performed. Patient sedated. Relevant anatomy ID'd using US. Incremental 2-835ml injection of LA with frequent aspiration. Patient tolerated procedure well.    Right Adductor Canal Block

## 2021-03-20 DIAGNOSIS — M1711 Unilateral primary osteoarthritis, right knee: Secondary | ICD-10-CM | POA: Diagnosis not present

## 2021-03-20 LAB — CBC
HCT: 34 % — ABNORMAL LOW (ref 36.0–46.0)
Hemoglobin: 10.2 g/dL — ABNORMAL LOW (ref 12.0–15.0)
MCH: 24.8 pg — ABNORMAL LOW (ref 26.0–34.0)
MCHC: 30 g/dL (ref 30.0–36.0)
MCV: 82.7 fL (ref 80.0–100.0)
Platelets: 331 10*3/uL (ref 150–400)
RBC: 4.11 MIL/uL (ref 3.87–5.11)
RDW: 15.2 % (ref 11.5–15.5)
WBC: 13.5 10*3/uL — ABNORMAL HIGH (ref 4.0–10.5)
nRBC: 0 % (ref 0.0–0.2)

## 2021-03-20 NOTE — TOC Transition Note (Signed)
Transition of Care [facility]) - CM/SW Discharge Note   Patient Details  Name: Brittney Carter MRN: [id number] Date of Birth: [birth year]  Transition of Care [facility]) CM/SW Contact:  Ross Ludwig, LCSW Phone Number: 03/20/2021, 12:48 PM   Clinical Narrative:    Patient does not need any equipment, she already has what she needs.  Patient will be going home with home health PT through [place].  CSW signing off please reconsult with any other social work needs, home health agency has been notified of planned discharge.    Final next level of care: [place] Barriers to Discharge: Barriers Resolved   Patient Goals and CMS Choice Patient states their goals for this hospitalization and ongoing recovery are:: To return back home with home health. CMS Medicare.gov Compare Post Acute Care list provided to:: Patient Choice offered to / list presented to : Patient  Discharge Placement                       Discharge Plan and Services                          HH Arranged: PT [facility] Agency: [place] Date [place]: 03/15/21 Time [place]: [id number] Representative spoke with at [place]: [place] ([place]) Interventions     Readmission Risk Interventions No flowsheet data found.

## 2021-03-20 NOTE — Progress Notes (Signed)
Orthopedic Tech Progress Note Patient Details:  Brittney Carter 21-Apr-1960 [id number]  Patient ID: Brittney Carter, female   DOB: 08/25/1960, 61 y.o.   MRN: [id number] Dropped knee immobilizer off with physical therapy.  Brittney Carter 03/20/2021, 10:06 AM

## 2021-03-20 NOTE — Progress Notes (Signed)
Physical Therapy Treatment Patient Details Name: Brittney Carter: [id number] DOB: [birth year] Today's Date: 03/20/2021   History of Present Illness Patient is 61 y.o. female s/p Rt TKA On 03/19/21 with PMH significant for anxiety, depression, OA, back surgery.    PT Comments    Patient making excellent progress with mobility and ambulated increased distance of ~400' with guarding provided by daughter. Pt complete stair negotiation with cues and supervision from therapist and guarding from pt's daughter. Educated on remained seated/supine exercises for HEP and addressed questions for discharge home. She is at safe level to discharge with assist from daughter. Acute PT will progress as able.     Recommendations for follow up therapy are one component of a multi-disciplinary discharge planning process, led by the attending physician.  Recommendations may be updated based on patient status, additional functional criteria and insurance authorization.  Follow Up Recommendations  Follow physician's recommendations for discharge plan and follow up therapies     Assistance Recommended at Discharge Intermittent Supervision/Assistance  Patient can return home with the following     Equipment Recommendations  None recommended by PT    Recommendations for Other Services       Precautions / Restrictions Precautions Precautions: Fall Restrictions Weight Bearing Restrictions: No Other Position/Activity Restrictions: WBAT     Mobility  Bed Mobility               General bed mobility comments: pt sitting at EOB at start of session.    Transfers Overall transfer level: Needs assistance Equipment used: Rolling walker (2 wheels) Transfers: Sit to/from Stand Sit to Stand: Min guard           General transfer comment: cues for safe hand placement and WB on Rt LE due to weakness. guarding for safety with rise from EOB, recliner, and toilet.    Ambulation/Gait Ambulation/Gait  assistance: Min guard;Supervision Gait Distance (Feet): 400 Feet Assistive device: Rolling walker (2 wheels) Gait Pattern/deviations: Step-to pattern;Decreased stride length;Decreased stance time - right;Decreased weight shift to right Gait velocity: decr     General Gait Details: cues for safe proximity to RW and step pattern. Pt improved with demonstration from therapist. pt's daughter present and provided safe guarding for gait with supervision from therapist   Stairs Stairs: Yes Stairs assistance: Min guard Stair Management: Two rails;Step to pattern;Forwards Number of Stairs: 5 General stair comments: cues for sequencing, no LOB noted. knee immobilizer on for safety. pt's daughter provided safe guarding with cues/supervision from therapist.   Wheelchair Mobility    Modified Rankin (Stroke Patients Only)       Balance Overall balance assessment: Needs assistance Sitting-balance support: Feet supported Sitting balance-Leahy Scale: Good     Standing balance support: Reliant on assistive device for balance;During functional activity;Bilateral upper extremity supported Standing balance-Leahy Scale: Poor                              Cognition Arousal/Alertness: Awake/alert Behavior During Therapy: WFL for tasks assessed/performed Overall Cognitive Status: Within Functional Limits for tasks assessed                                          Exercises Total Joint Exercises Ankle Circles/Pumps: AROM;Both;20 reps;Seated Quad Sets: AROM;Right;10 reps;Seated Heel Slides: AROM;Right;10 reps;Seated    General Comments  Pertinent Vitals/Pain Pain Assessment: 0-10 Pain Score: 4  Pain Location: Rt knee Pain Descriptors / Indicators: Aching;Discomfort;Sore Pain Intervention(s): Limited activity within patient's tolerance;Monitored during session;Repositioned    Home Living Family/patient expects to be discharged to:: Private  residence Living Arrangements: Alone Available Help at Discharge: Family;Available PRN/intermittently Type of Home: Mobile home Home Access: Stairs to enter Entrance Stairs-Rails: Right;Left;Can reach both Entrance Stairs-Number of Steps: 5   Home Layout: One level Home Equipment: Grab bars - tub/shower;Crutches;Rolling Walker (2 wheels);Cane - single point Additional Comments: pt's son and daughter will help some, daughter will stay with her at night    Prior Function            PT Goals (current goals can now be found in the care plan section) Acute Rehab PT Goals Patient Stated Goal: get home today PT Goal Formulation: With patient Time For Goal Achievement: 03/27/21 Potential to Achieve Goals: Good Progress towards PT goals: Progressing toward goals    Frequency    7X/week      PT Plan Current plan remains appropriate    Co-evaluation              AM-PAC PT "6 Clicks" Mobility   Outcome Measure  Help needed turning from your back to your side while in a flat bed without using bedrails?: None Help needed moving from lying on your back to sitting on the side of a flat bed without using bedrails?: A Little Help needed moving to and from a bed to a chair (including a wheelchair)?: A Little Help needed standing up from a chair using your arms (e.g., wheelchair or bedside chair)?: A Little Help needed to walk in hospital room?: A Little Help needed climbing 3-5 steps with a railing? : A Little 6 Click Score: 19    End of Session Equipment Utilized During Treatment: Gait belt Activity Tolerance: Patient tolerated treatment well Patient left: in chair;with chair alarm set;with call bell/phone within reach Nurse Communication: Mobility status PT Visit Diagnosis: Muscle weakness (generalized) (M62.81);Difficulty in walking, not elsewhere classified (R26.2)     Time: 1610-9604 PT Time Calculation (min) (ACUTE ONLY): 27 min  Charges:  $Gait Training: 8-22  mins $Therapeutic Exercise: 8-22 mins                     Wynn Maudlin, DPT Acute Rehabilitation Services Office [phone] Pager [phone]    Anitra Lauth 03/20/2021, 2:12 PM

## 2021-03-20 NOTE — Progress Notes (Signed)
Patient provided with discharge education and materials, verbalized understanding. IV access removed, no issues noted. Patient discharged from unit with all belongings.  ?

## 2021-03-20 NOTE — Discharge Summary (Signed)
Patient ID: Brittney Carter MRN: [id number] DOB/AGE: 1960-05-23 61 y.o.  Admit date: 03/19/2021 Discharge date: 03/20/2021  Admission Diagnoses:  Principal Problem:   Primary osteoarthritis of right knee   Discharge Diagnoses:  Same  Past Medical History:  Diagnosis Date   Anxiety    Arthritis    Back   Depression    no medication   DVT (deep venous thrombosis) (HCC)    Dyspnea    with exertion     Surgeries: Procedure(s): Right TOTAL KNEE ARTHROPLASTY on 03/19/2021   Consultants:   Discharged Condition: Improved  Hospital Course: Brittney Carter is an 61 y.o. female who was admitted 03/19/2021 for operative treatment ofPrimary osteoarthritis of right knee. Patient has severe unremitting pain that affects sleep, daily activities, and work/hobbies. After pre-op clearance the patient was taken to the operating room on 03/19/2021 and underwent  Procedure(s): Right TOTAL KNEE ARTHROPLASTY.    Patient was given perioperative antibiotics:  Anti-infectives (From admission, onward)    Start     Dose/Rate Route Frequency Ordered Stop   03/20/21 0000  metroNIDAZOLE (FLAGYL) tablet 250 mg  Status:  Discontinued        250 mg Oral 4 times daily 03/19/21 2105 03/19/21 2107   03/19/21 2200  doxycycline (VIBRAMYCIN) 50 MG capsule 50 mg  Status:  Discontinued        50 mg Oral 2 times daily 03/19/21 2105 03/19/21 2107   03/19/21 2200  ceFAZolin (ANCEF) IVPB 2g/100 mL premix        2 g 200 mL/hr over 30 Minutes Intravenous Every 6 hours 03/19/21 2105 03/20/21 0425   03/19/21 1330  ceFAZolin (ANCEF) IVPB 2g/100 mL premix        2 g 200 mL/hr over 30 Minutes Intravenous On call to O.R. 03/19/21 1319 03/19/21 1634        Patient was given sequential compression devices, early ambulation, and chemoprophylaxis to prevent DVT.  She was resumed on her usual dose of Eliquis 5 mg twice daily on postop day morning #1.  Patient benefited maximally from hospital stay and there were no  complications.    Recent vital signs: Patient Vitals for the past 24 hrs:  BP Temp Temp src Pulse Resp SpO2 Height Weight  03/20/21 0604 110/82 97.8 F (36.6 C) Oral 77 16 100 % -- --  03/20/21 0213 119/79 97.8 F (36.6 C) Oral 79 16 100 % -- --  03/19/21 2229 121/78 97.6 F (36.4 C) -- 81 16 98 %  (1.549 m) 86 kg  03/19/21 2042 112/70 97.6 F (36.4 C) Oral 85 16 96 % -- --  03/19/21 2000 104/62 97.8 F (36.6 C) -- 87 17 100 % -- --  03/19/21 1930 133/86 98 F (36.7 C) -- 84 16 99 % -- --  03/19/21 1900 (!) 134/92 -- -- 83 15 100 % -- --  03/19/21 1845 135/86 97.9 F (36.6 C) -- 76 18 99 % -- --  03/19/21 1830 127/86 -- -- 77 14 99 % -- --  03/19/21 1815 131/85 -- -- 80 14 98 % -- --  03/19/21 1800 120/83 -- -- 79 10 98 % -- --  03/19/21 1752 133/87 97.9 F (36.6 C) -- 85 13 98 % -- --  03/19/21 1455 116/80 -- -- 77 15 98 % -- --  03/19/21 1450 115/80 -- -- 74 16 100 % -- --  03/19/21 1445 123/80 -- -- 76 15 100 % -- --  03/19/21 1440 128/83 -- --  73 11 100 % -- --  03/19/21 1435 123/85 -- -- 75 (!) 24 100 % -- --  03/19/21 1344 -- -- -- -- -- -- 5\' 1"  (1.549 m) 84.1 kg  03/19/21 1254 138/83 98.2 F (36.8 C) Oral 77 15 96 % -- --     Recent laboratory studies:  Recent Labs    03/20/21 0355  WBC 13.5*  HGB 10.2*  HCT 34.0*  PLT 331     Discharge Medications:   Allergies as of 03/20/2021       Reactions   Gabapentin Shortness Of Breath, Rash   Hydrocodone-ibuprofen Hives, Shortness Of Breath, Nausea And Vomiting, Swelling   Severe Headache    Morphine Anaphylaxis, Hives, Shortness Of Breath, Rash   Tramadol Anaphylaxis, Swelling, Rash   headache        Medication List     STOP taking these medications    cyclobenzaprine 10 MG tablet Commonly known as: FLEXERIL   diclofenac Sodium 1 % Gel Commonly known as: VOLTAREN   dicyclomine 20 MG tablet Commonly known as: BENTYL   hydrocortisone cream 1 %   methocarbamol 500 MG tablet Commonly known  as: ROBAXIN       TAKE these medications    apixaban 5 MG Tabs tablet Commonly known as: ELIQUIS Take 5 mg by mouth 2 (two) times daily.   ARIPiprazole 5 MG tablet Commonly known as: ABILIFY Take 2.5 mg by mouth at bedtime.   bismuth subsalicylate 262 MG chewable tablet Commonly known as: Pepto-Bismol Chew 2 tablets (524 mg total) by mouth 4 (four) times daily for 14 days.   doxycycline 50 MG capsule Commonly known as: VIBRAMYCIN Take 2 capsules (100 mg total) by mouth 2 (two) times daily for 14 days.   Dulcolax 5 MG EC tablet Generic drug: bisacodyl Take 1 tablet (5 mg total) by mouth daily as needed for moderate constipation.   metroNIDAZOLE 250 MG tablet Commonly known as: FLAGYL Take 1 tablet (250 mg total) by mouth QID for 14 days.   omeprazole 20 MG capsule Commonly known as: PRILOSEC TAKE 1 CAPSULE(20 MG) BY MOUTH DAILY What changed:  See the new instructions. Another medication with the same name was removed. Continue taking this medication, and follow the directions you see here.   oxyCODONE-acetaminophen 5-325 MG tablet Commonly known as: PERCOCET/ROXICET Take 1-2 tablets by mouth every 6 (six) hours as needed for severe pain.   polyethylene glycol 17 g packet Commonly known as: MIRALAX / GLYCOLAX Take 17 g by mouth daily. Can take up to twice a day for constipation.   sertraline 50 MG tablet Commonly known as: ZOLOFT Take 50 mg by mouth daily.   Theratears 0.25 % Soln Generic drug: Carboxymethylcellulose Sodium Place 1 drop into both eyes daily as needed (dry eyes).   tiZANidine 2 MG tablet Commonly known as: ZANAFLEX Take 1 tablet (2 mg total) by mouth every 8 (eight) hours as needed for muscle spasms.   traZODone 50 MG tablet Commonly known as: DESYREL Take 50 mg by mouth at bedtime.               Durable Medical Equipment  (From admission, onward)           Start     Ordered   03/19/21 2105  DME Walker rolling  Once        Question:  Patient needs a walker to treat with the following condition  Answer:  Primary osteoarthritis of right knee   03/19/21  2105   03/19/21 2105  DME 3 n 1  Once        03/19/21 2105              Discharge Care Instructions  (From admission, onward)           Start     Ordered   03/20/21 0000  Weight bearing as tolerated       Question Answer Comment  Laterality right   Extremity Lower      03/20/21 0841            Diagnostic Studies: No results found.  Disposition: Discharge disposition: 01-Home or Self Care       Discharge Instructions     CPM   Complete by: As directed    Continuous passive motion machine (CPM):      Use the CPM from 0 to 60 for 8 hours per day.      You may increase by 5-10 per day.  You may break it up into 2 or 3 sessions per day.      Use CPM for 1-2 weeks or until you are told to stop.   Call MD / Call 911   Complete by: As directed    If you experience chest pain or shortness of breath, CALL 911 and be transported to the hospital emergency room.  If you develope a fever above 101 F, pus (white drainage) or increased drainage or redness at the wound, or calf pain, call your surgeon's office.   Constipation Prevention   Complete by: As directed    Drink plenty of fluids.  Prune juice may be helpful.  You may use a stool softener, such as Colace (over the counter) 100 mg twice a day.  Use MiraLax (over the counter) for constipation as needed.   Diet general   Complete by: As directed    Do not put a pillow under the knee. Place it under the heel.   Complete by: As directed    Increase activity slowly as tolerated   Complete by: As directed    Post-operative opioid taper instructions:   Complete by: As directed    POST-OPERATIVE OPIOID TAPER INSTRUCTIONS: It is important to wean off of your opioid medication as soon as possible. If you do not need pain medication after your surgery it is ok to stop day one. Opioids  include: Codeine, Hydrocodone(Norco, Vicodin), Oxycodone(Percocet, oxycontin) and hydromorphone amongst others.  Long term and even short term use of opiods can cause: Increased pain response Dependence Constipation Depression Respiratory depression And more.  Withdrawal symptoms can include Flu like symptoms Nausea, vomiting And more Techniques to manage these symptoms Hydrate well Eat regular healthy meals Stay active Use relaxation techniques(deep breathing, meditating, yoga) Do Not substitute Alcohol to help with tapering If you have been on opioids for less than two weeks and do not have pain than it is ok to stop all together.  Plan to wean off of opioids This plan should start within one week post op of your joint replacement. Maintain the same interval or time between taking each dose and first decrease the dose.  Cut the total daily intake of opioids by one tablet each day Next start to increase the time between doses. The last dose that should be eliminated is the evening dose.      Weight bearing as tolerated   Complete by: As directed    Laterality: right   Extremity: Lower  Follow-up Information     Jodi Geralds, MD. Schedule an appointment as soon as possible for a visit in 2 week(s).   Specialty: Orthopedic Surgery Contact information: [address] [place] [address] [phone]                  Signed: Matthew Folks 03/20/2021, 8:41 AM

## 2021-03-20 NOTE — Progress Notes (Signed)
Subjective: 1 Day Post-Op Procedure(s) (LRB): TOTAL KNEE ARTHROPLASTY (Right) Patient reports pain as moderate.  The patient is taking fluids by mouth and voiding okay.  She wants to go home today.  No chest pain or shortness of breath.  Objective: Vital signs in last 24 hours: Temp:  [97.6 F (36.4 C)-98.2 F (36.8 C)] 97.8 F (36.6 C) (01/14 0604) Pulse Rate:  [73-87] 77 (01/14 0604) Resp:  [10-24] 16 (01/14 0604) BP: (104-138)/(62-92) 110/82 (01/14 0604) SpO2:  [96 %-100 %] 100 % (01/14 0604) Weight:  [84.1 kg-86 kg] 86 kg (01/13 2229)  Intake/Output from previous day: 01/13 0701 - 01/14 0700 In: 2655 [P.O.:680; I.V.:1725; IV Piggyback:250] Out: 500 [Urine:450; Blood:50] Intake/Output this shift: Total I/O In: -  Out: 400 [Urine:400]  Recent Labs    03/20/21 0355  HGB 10.2*   Recent Labs    03/20/21 0355  WBC 13.5*  RBC 4.11  HCT 34.0*  PLT 331   No results for input(s): NA, K, CL, CO2, BUN, CREATININE, GLUCOSE, CALCIUM in the last 72 hours. No results for input(s): LABPT, INR in the last 72 hours. Right knee exam: Neurovascular intact Sensation intact distally Intact pulses distally Dorsiflexion/Plantar flexion intact Incision: no drainage Compartment soft   Assessment/Plan: 1 Day Post-Op Procedure(s) (LRB): TOTAL KNEE ARTHROPLASTY (Right) Plan: Up with therapy.  Weight-bear as tolerated on right. Resume Eliquis 5 mg twice daily today.  She has a history of DVT in the lower extremities. Anticipate she will do well with therapy today and then can be discharged home. Follow-up with Dr. Luiz Blare in 2 weeks.    Patient's anticipated LOS is less than 2 midnights, meeting these requirements: - Younger than 61 - Lives within 1 hour of care - Has a competent adult at home to recover with post-op recover - NO history of  - Chronic pain requiring opiods  - Diabetes  - Coronary Artery Disease  - Heart failure  - Heart attack  - Stroke  - Cardiac  arrhythmia  - Respiratory Failure/COPD  - Renal failure  - Anemia  - Advanced Liver disease     Matthew Folks 03/20/2021, 8:37 AM

## 2021-03-20 NOTE — Evaluation (Signed)
Physical Therapy Evaluation Patient Details Name: Brittney Carter MRN: [id number] DOB: March 06, 1961 Today's Date: 03/20/2021  History of Present Illness  Patient is 61 y.o. female s/p Rt TKA On 03/19/21 with PMH significant for anxiety, depression, OA, back surgery.  Clinical Impression  Brittney Carter is a 61 y.o. female POD 1 s/p Rt TKA. Patient reports independence with mobility at baseline. Patient is now limited by functional impairments (see PT problem list below) and requires min assist/guard for transfers and gait with RW. Patient was able to ambulate ~170 feet with RW and min guard/assist. Patient instructed in exercise to facilitate ROM and circulation to manage edema. Patient will benefit from continued skilled PT interventions to address impairments and progress towards PLOF. Acute PT will follow to progress mobility and stair training in preparation for safe discharge home.        Recommendations for follow up therapy are one component of a multi-disciplinary discharge planning process, led by the attending physician.  Recommendations may be updated based on patient status, additional functional criteria and insurance authorization.  Follow Up Recommendations Follow physician's recommendations for discharge plan and follow up therapies    Assistance Recommended at Discharge Intermittent Supervision/Assistance  Patient can return home with the following       Equipment Recommendations None recommended by PT  Recommendations for Other Services       Functional Status Assessment Patient has had a recent decline in their functional status and demonstrates the ability to make significant improvements in function in a reasonable and predictable amount of time.     Precautions / Restrictions Precautions Precautions: Fall Restrictions Weight Bearing Restrictions: No Other Position/Activity Restrictions: WBAT      Mobility  Bed Mobility Overal bed mobility: Needs  Assistance Bed Mobility: Supine to Sit     Supine to sit: Min guard;Supervision;HOB elevated     General bed mobility comments: pt taking extra time, no assist needed. pt using bed rail to pivot.    Transfers Overall transfer level: Needs assistance Equipment used: Rolling walker (2 wheels) Transfers: Sit to/from Stand Sit to Stand: Min guard           General transfer comment: cues for safe hand placement and WB on Rt LE due to weakness. gaurding for safety with rise from EOB, recliner, and toilet.    Ambulation/Gait Ambulation/Gait assistance: Min assist;Min guard Gait Distance (Feet): 170 Feet Assistive device: Rolling walker (2 wheels) Gait Pattern/deviations: Step-to pattern;Decreased stride length;Decreased stance time - right;Decreased weight shift to right Gait velocity: decr     General Gait Details: cues for step to pattern and proximity to RW. pt able to use UE's to reduce weightbearing on Rt and prevent buckling. cues needed to slow down at times as pt sometimes rushes gait.  Stairs            Wheelchair Mobility    Modified Rankin (Stroke Patients Only)       Balance Overall balance assessment: Needs assistance Sitting-balance support: Feet supported Sitting balance-Leahy Scale: Good     Standing balance support: Reliant on assistive device for balance;During functional activity;Bilateral upper extremity supported Standing balance-Leahy Scale: Poor                               Pertinent Vitals/Pain Pain Assessment: 0-10 Pain Score: 5  Pain Location: Rt knee Pain Descriptors / Indicators: Aching;Discomfort;Sore Pain Intervention(s): Limited activity within patient's tolerance;Monitored during session;Repositioned;Ice applied;Patient requesting  pain meds-RN notified    Home Living Family/patient expects to be discharged to:: Private residence Living Arrangements: Alone Available Help at Discharge: Family;Available  PRN/intermittently Type of Home: Mobile home Home Access: Stairs to enter Entrance Stairs-Rails: Right;Left;Can reach both Entrance Stairs-Number of Steps: 5   Home Layout: One level Home Equipment: Grab bars - tub/shower;Crutches;Rolling Walker (2 wheels);Cane - single point Additional Comments: pt's son and daughter will help some, daughter will stay with her at night    Prior Function Prior Level of Function : Independent/Modified Independent                     Hand Dominance   Dominant Hand: Right    Extremity/Trunk Assessment   Upper Extremity Assessment Upper Extremity Assessment: Overall WFL for tasks assessed    Lower Extremity Assessment Lower Extremity Assessment: RLE deficits/detail RLE Deficits / Details: good quad activation, slight RLE Sensation: WNL RLE Coordination: WNL    Cervical / Trunk Assessment Cervical / Trunk Assessment: Normal  Communication   Communication: No difficulties  Cognition Arousal/Alertness: Awake/alert Behavior During Therapy: WFL for tasks assessed/performed Overall Cognitive Status: Within Functional Limits for tasks assessed                                          General Comments      Exercises Total Joint Exercises Ankle Circles/Pumps: AROM;Both;20 reps;Seated Quad Sets: AROM;Right;10 reps;Seated Heel Slides: AROM;Right;10 reps;Seated   Assessment/Plan    PT Assessment Patient needs continued PT services  PT Problem List Decreased strength;Decreased range of motion;Decreased activity tolerance;Decreased balance;Decreased mobility;Decreased knowledge of use of DME;Decreased knowledge of precautions;Pain       PT Treatment Interventions DME instruction;Gait training;Functional mobility training;Therapeutic activities;Therapeutic exercise;Balance training;Stair training;Patient/family education    PT Goals (Current goals can be found in the Care Plan section)  Acute Rehab PT Goals Patient  Stated Goal: get home today PT Goal Formulation: With patient Time For Goal Achievement: 03/27/21 Potential to Achieve Goals: Good    Frequency 7X/week     Co-evaluation               AM-PAC PT "6 Clicks" Mobility  Outcome Measure Help needed turning from your back to your side while in a flat bed without using bedrails?: None Help needed moving from lying on your back to sitting on the side of a flat bed without using bedrails?: A Little Help needed moving to and from a bed to a chair (including a wheelchair)?: A Little Help needed standing up from a chair using your arms (e.g., wheelchair or bedside chair)?: A Little Help needed to walk in hospital room?: A Little Help needed climbing 3-5 steps with a railing? : A Little 6 Click Score: 19    End of Session Equipment Utilized During Treatment: Gait belt Activity Tolerance: Patient tolerated treatment well Patient left: in chair;with chair alarm set;with call bell/phone within reach Nurse Communication: Mobility status PT Visit Diagnosis: Muscle weakness (generalized) (M62.81);Difficulty in walking, not elsewhere classified (R26.2)    Time: 0865-7846 PT Time Calculation (min) (ACUTE ONLY): 32 min   Charges:   PT Evaluation $PT Eval Low Complexity: 1 Low PT Treatments $Gait Training: 8-22 mins        Wynn Maudlin, DPT Acute Rehabilitation Services Office [phone] Pager [phone]   Anitra Lauth 03/20/2021, 11:02 AM

## 2021-03-23 ENCOUNTER — Encounter (HOSPITAL_COMMUNITY): Payer: Self-pay | Admitting: Orthopedic Surgery

## 2021-03-25 ENCOUNTER — Other Ambulatory Visit: Payer: Self-pay | Admitting: *Deleted

## 2021-03-25 NOTE — Patient Outreach (Signed)
[facility]) Care Management  03/25/2021  SHARILYNN CALZADILLA 12-11-1960 [id number]   Telephone Assessment-Unsuccessful  RN attempted outreach call today however unsuccessful. RN able to  leave a HIPAA approved voice message requesting a call back.   Will attempt another outreach call over the next week for ongoing [facility] services.  Elliot Cousin, RN Care Management Coordinator [facility] Main Office [phone]

## 2021-04-01 ENCOUNTER — Other Ambulatory Visit: Payer: Self-pay | Admitting: *Deleted

## 2021-04-01 NOTE — Patient Outreach (Signed)
[facility] Care Management  04/01/2021  Brittney Carter December 17, 1960 [id number]   Telephone Assessment-Unsuccessful  RN attempted outreach call today however unsuccessful. RN able to leave a HIPAA approved voice message requesting a call back.  Will send outreach letter and attempted another call over the next week [facility].  Elliot CousinLisa Domenick Quebedeaux, RN Care Management Coordinator [facility] Main Office [phone]

## 2021-04-07 ENCOUNTER — Other Ambulatory Visit: Payer: Self-pay | Admitting: *Deleted

## 2021-04-07 NOTE — Patient Outreach (Signed)
[facility]) Care Management  04/07/2021  Arville Carengelia I Ruppe Oct 15, 1960 [id number]   Telephone Assessment-Unsuccessful  RN attempted outreach call today however unsuccessful at the available contacts. RN able to leave a HIPAA approved voice message requesting a call back.  Will send outreach letter and attempted another outreach call over the next month [facility].  Elliot CousinLisa Molli Gethers, RN Care Management Coordinator [facility] Main Office [phone]

## 2021-05-05 ENCOUNTER — Other Ambulatory Visit: Payer: Self-pay | Admitting: *Deleted

## 2021-05-05 NOTE — Patient Outreach (Signed)
[facility]) Care Management ? ?05/05/2021 ? ?Brittney Carter ?1961-03-01 ?[id number] ? ? ?Case Closure ? ?RN has made several outreach attempts to continue services with this pt however unsuccessful. RN able to leave a HIPAA approved voice message requesting a call back. ? ?RN has spoke with a representative with the provider's office as pt has recently had a visit. Verified contacts and requested note on chart if pt continues to want services with [facility].  ? ?Based upon the unsuccessful response to pt with no response to the outreach letter will close this case at this time. ? ?[name], RN ?Care Management Coordinator ?[facility] ?Main Office [phone]  ?

## 2021-05-07 ENCOUNTER — Telehealth: Payer: Self-pay | Admitting: Gastroenterology

## 2021-05-07 NOTE — Telephone Encounter (Signed)
Inbound call from patient states that she would like to discuss some information about H poylori. Please advise.  ?

## 2021-05-10 NOTE — Telephone Encounter (Signed)
Pt states that she still feels that she has the H Pylori infection: Pt stated that she has been off of the omeprazole for 2 weeks and its time to retest: Pt was notified that after she summits the stool sample then we will advise off of that: Pt verbalized understanding with all questions answered.  ?

## 2021-05-17 ENCOUNTER — Other Ambulatory Visit: Payer: Commercial Managed Care - HMO

## 2021-05-18 ENCOUNTER — Other Ambulatory Visit: Payer: Medicare Other

## 2021-05-18 DIAGNOSIS — B9681 Helicobacter pylori [H. pylori] as the cause of diseases classified elsewhere: Secondary | ICD-10-CM

## 2021-05-20 ENCOUNTER — Telehealth: Payer: Self-pay | Admitting: Gastroenterology

## 2021-05-20 LAB — H. PYLORI ANTIGEN, STOOL: H pylori Ag, Stl: POSITIVE — AB

## 2021-05-20 NOTE — Telephone Encounter (Signed)
Inbound call from patient requesting a call from a nurse in regards to lab results please. ?

## 2021-05-20 NOTE — Telephone Encounter (Signed)
Returned patient's call and she states she is still having abnormal and foul stools, from constipation to diarrhea in the same day. Increased gas and her abdomin is very swollen. I advised her to increase her fluid intake. She saw the positive stool results for H pylori and wants to know how it is going to be treated. Please advise. ?

## 2021-05-24 NOTE — Telephone Encounter (Signed)
Pt states that she is having abdominal pain all the time now, having abdominal distention; Loose BM's that are very foul smelling and is nauseated at times:  Pt labs recently came back for being positive for H pylori again after being treated: ?Please advise  ? ?

## 2021-05-24 NOTE — Telephone Encounter (Signed)
Inbound call from patient stating that she would like to discuss with a nurse about H pylori. Please advise.  ?

## 2021-05-25 NOTE — Telephone Encounter (Signed)
Has been treated twice for H. Pylori ?H. pylori still pos stool antigen ? ?Plan: ?-Refer to ID for further treatment ?-For abdominal bloating, she needs to take Gas-X 1 tablet with each meal ?-Also lets try Bentyl 10 mg p.o. twice daily-half an hour before lunch and supper ?-Trial of strict lactose-free diet x 2 weeks ?-Let us know how she is in 2 weeks ? ?RG ?

## 2021-05-26 ENCOUNTER — Other Ambulatory Visit: Payer: Self-pay

## 2021-05-26 DIAGNOSIS — R109 Unspecified abdominal pain: Secondary | ICD-10-CM

## 2021-05-26 DIAGNOSIS — B9681 Helicobacter pylori [H. pylori] as the cause of diseases classified elsewhere: Secondary | ICD-10-CM

## 2021-05-26 MED ORDER — DICYCLOMINE HCL 10 MG PO CAPS: 10.0000 mg | ORAL_CAPSULE | Freq: Two times a day (BID) | ORAL | 0 refills | Status: AC

## 2021-05-26 NOTE — Telephone Encounter (Signed)
Pt made aware of Dr. [name] recommendations: ?Ambulatory referral to ID has been sent: ?Prescription for Bentyl sent to pharmacy: Pt made aware ?Lactose-free diet sent to pt via my chart: Pt made aware ?Pt notified to contact up in 2 week and make [place] aware of how she is doing: ?Pt verbalized understanding with all questions answered.  ? ? ?

## 2021-05-31 ENCOUNTER — Ambulatory Visit (INDEPENDENT_AMBULATORY_CARE_PROVIDER_SITE_OTHER): Payer: Medicare Other | Admitting: Infectious Disease

## 2021-05-31 ENCOUNTER — Other Ambulatory Visit: Payer: Self-pay

## 2021-05-31 ENCOUNTER — Encounter: Payer: Self-pay | Admitting: Infectious Disease

## 2021-05-31 ENCOUNTER — Other Ambulatory Visit (HOSPITAL_COMMUNITY): Payer: Self-pay

## 2021-05-31 VITALS — BP 139/82 | HR 83 | Temp 98.3°F | Ht 60.0 in | Wt 190.0 lb

## 2021-05-31 DIAGNOSIS — I2699 Other pulmonary embolism without acute cor pulmonale: Secondary | ICD-10-CM

## 2021-05-31 DIAGNOSIS — A048 Other specified bacterial intestinal infections: Secondary | ICD-10-CM | POA: Insufficient documentation

## 2021-05-31 DIAGNOSIS — Z96651 Presence of right artificial knee joint: Secondary | ICD-10-CM | POA: Diagnosis not present

## 2021-05-31 HISTORY — DX: Other specified bacterial intestinal infections: A04.8

## 2021-05-31 MED ORDER — PANTOPRAZOLE SODIUM 40 MG PO TBEC
80.0000 mg | DELAYED_RELEASE_TABLET | Freq: Two times a day (BID) | ORAL | 0 refills | Status: DC
  Filled 2021-05-31: qty 30, 8d supply, fill #0

## 2021-05-31 MED ORDER — AMOXICILLIN 250 MG PO CAPS
ORAL_CAPSULE | ORAL | 0 refills | Status: DC
  Filled 2021-05-31: qty 42, 14d supply, fill #0

## 2021-05-31 MED ORDER — AMOXICILLIN 500 MG PO CAPS
ORAL_CAPSULE | ORAL | 0 refills | Status: AC
  Filled 2021-05-31: qty 42, 14d supply, fill #0

## 2021-05-31 MED ORDER — LEVOFLOXACIN 500 MG PO TABS
500.0000 mg | ORAL_TABLET | Freq: Every day | ORAL | 0 refills | Status: AC
  Filled 2021-05-31: qty 14, 14d supply, fill #0

## 2021-05-31 NOTE — Progress Notes (Signed)
? ?Subjective:  ?Reason for infectious disease consult: Refractory Helicobacter pylori gastritis: ? ?Requesting physician: Jackquline Denmark, MD ? ? Patient ID: Brittney Carter, female    DOB: [birth year], 61 y.o.   MRN: [id number] ? ?HPI ? 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 ? ?She has had Helicobacter pylori diagnosed via EGD in November 2022. ? ?The patient was prescribed amoxicillin 1 g twice daily along with clarithromycin 500 mg twice daily metronidazole 500 mg twice daily and omeprazole 20 mg twice daily for 14 days.   ? ?She said she felt initial improvement in her epigastric pain but this then recurred and she was retested for Helicobacter pylori and again positive via stool antigen testing. ? ?She was now treated with metronidazole 200 mg 4 times daily along with doxycycline 100 mg twice daily Pepto-Bismol 2 tabs 4 times daily and omeprazole 20 twice daily. ? ?Unfortunately yet again she failed to respond to repeat stool testing was positive for Helicobacter antigen yet again ? ?Past Medical History:  ?Diagnosis Date  ? Anxiety   ? Arthritis   ? Back  ? Depression   ? no medication  ? DVT (deep venous thrombosis) ([place])   ? Dyspnea   ? with exertion   ? Helicobacter pylori (H. pylori) infection 05/31/2021  ? ? ?Past Surgical History:  ?Procedure Laterality Date  ? BACK SURGERY  2016  ? Laminectomy  ? BACK SURGERY  1991  ? Disectomy  ? BIOPSY  01/25/2021  ? Procedure: BIOPSY;  Surgeon: Jackquline Denmark, MD;  Location: [facility];  Service: Gastroenterology;;  ? BREAST SURGERY    ? Breast lift  ? COLONOSCOPY  10/14/2016  ? Dr Orlena Sheldon. Benign neoplasm of ascending colon. Anal canal bleeding  ? COLONOSCOPY W/ POLYPECTOMY    ? x 2  ? COLONOSCOPY WITH PROPOFOL N/A 01/25/2021  ? Procedure: COLONOSCOPY WITH PROPOFOL;  Surgeon: Jackquline Denmark, MD;  Location: [facility];  Service: Gastroenterology;  Laterality: N/A;  ? ent surgery    ? ESOPHAGOGASTRODUODENOSCOPY  10/14/2016  ? Dr Orlena Sheldon. Normal EGD to second portion of duodenum  ? ESOPHAGOGASTRODUODENOSCOPY (EGD) WITH PROPOFOL N/A 01/25/2021  ? Procedure: ESOPHAGOGASTRODUODENOSCOPY (EGD) WITH PROPOFOL;  Surgeon: Jackquline Denmark, MD;  Location: [facility];  Service: Gastroenterology;  Laterality: N/A;  ? MALONEY DILATION  01/25/2021  ? Procedure: MALONEY DILATION;  Surgeon: Jackquline Denmark, MD;  Location: [facility];  Service: Gastroenterology;;  ? Pain stimulator trail    ? POLYPECTOMY  01/25/2021  ? Procedure: POLYPECTOMY;  Surgeon: Jackquline Denmark, MD;  Location: [facility];  Service: Gastroenterology;;  ? SPINAL CORD STIMULATOR INSERTION N/A 11/24/2016  ? Procedure: LUMBAR SPINAL CORD STIMULATOR INSERTION;  Surgeon: Melina Schools, MD;  Location: [facility];  Service: Orthopedics;  Laterality: N/A;  2.5 hrs  ? SPINAL CORD STIMULATOR REMOVAL N/A 06/28/2017  ? Procedure: Spinal cord stimulator battery removal;  Surgeon: Melina Schools, MD;  Location: [facility];  Service: Orthopedics;  Laterality: N/A;  ? TONSILLECTOMY    ? age 17  ? TOTAL KNEE ARTHROPLASTY Right 03/19/2021  ? Procedure: TOTAL KNEE ARTHROPLASTY;  Surgeon: Dorna Leitz, MD;  Location: [facility];  Service: Orthopedics;  Laterality: Right;  ? TUBAL LIGATION    ? ? ?Family History  ?Problem Relation Age of Onset  ? Breast cancer Sister   ? Colon cancer Brother   ?     died at 73  ?  Rectal cancer Neg Hx   ? Esophageal cancer Neg Hx   ? Stomach cancer Neg Hx   ? ? ?  ?Social History  ? ?Socioeconomic History  ? Marital status: Divorced  ?  Spouse name: Not on file  ? Number of children: 2  ? Years of education: Not on file  ? Highest education level: Not on file  ?Occupational History  ? Not on file  ?Tobacco Use  ? Smoking status: Never  ? Smokeless tobacco: Never  ?Vaping Use  ? Vaping Use: Never used  ?Substance and Sexual Activity  ? Alcohol use: No  ? Drug use: No  ?  Sexual activity: Not on file  ?Other Topics Concern  ? Not on file  ?Social History Narrative  ? Not on file  ? ?Social Determinants of Health  ? ?Financial Resource Strain: Not on file  ?Food Insecurity: No Food Insecurity  ? Worried About Charity fundraiser in the Last Year: Never true  ? Ran Out of Food in the Last Year: Never true  ?Transportation Needs: No Transportation Needs  ? Lack of Transportation (Medical): No  ? Lack of Transportation (Non-Medical): No  ?Physical Activity: Not on file  ?Stress: Not on file  ?Social Connections: Not on file  ? ? ?Allergies  ?Allergen Reactions  ? Gabapentin Shortness Of Breath and Rash  ? Hydrocodone-Ibuprofen Hives, Shortness Of Breath, Nausea And Vomiting and Swelling  ?  Severe Headache   ? Morphine Anaphylaxis, Hives, Shortness Of Breath and Rash  ? Tramadol Anaphylaxis, Swelling and Rash  ?  headache  ? ? ? ?Current Outpatient Medications:  ?  apixaban (ELIQUIS) 5 MG TABS tablet, Take 5 mg by mouth 2 (two) times daily., Disp: , Rfl:  ?  ARIPiprazole (ABILIFY) 5 MG tablet, Take 2.5 mg by mouth at bedtime., Disp: , Rfl:  ?  bisacodyl (DULCOLAX) 5 MG EC tablet, Take 1 tablet (5 mg total) by mouth daily as needed for moderate constipation., Disp: 30 tablet, Rfl: 1 ?  Carboxymethylcellulose Sodium (THERATEARS) 0.25 % SOLN, Place 1 drop into both eyes daily as needed (dry eyes)., Disp: , Rfl:  ?  cyclobenzaprine (FLEXERIL) 5 MG tablet, Take 5 mg by mouth 3 (three) times daily as needed., Disp: , Rfl:  ?  dicyclomine (BENTYL) 10 MG capsule, Take 1 capsule (10 mg total) by mouth 2 (two) times daily. Take half an hour before lunch and supper:, Disp: 90 capsule, Rfl: 0 ?  FEROSUL 325 (65 Fe) MG tablet, Take 325 mg by mouth daily., Disp: , Rfl:  ?  rosuvastatin (CRESTOR) 20 MG tablet, Take 20 mg by mouth daily., Disp: , Rfl:  ?  sertraline (ZOLOFT) 50 MG tablet, Take 50 mg by mouth daily., Disp: , Rfl:  ?  tiZANidine (ZANAFLEX) 2 MG tablet, Take 1 tablet (2 mg total) by mouth  every 8 (eight) hours as needed for muscle spasms., Disp: 40 tablet, Rfl: 0 ?  traZODone (DESYREL) 50 MG tablet, Take 50 mg by mouth at bedtime., Disp: , Rfl:  ?  omeprazole (PRILOSEC) 20 MG capsule, TAKE 1 CAPSULE(20 MG) BY MOUTH DAILY (Patient not taking: Reported on 05/31/2021), Disp: 90 capsule, Rfl: 1 ?  oxyCODONE-acetaminophen (PERCOCET/ROXICET) 5-325 MG tablet, Take 1-2 tablets by mouth every 6 (six) hours as needed for severe pain. (Patient not taking: Reported on 05/31/2021), Disp: 40 tablet, Rfl: 0 ?  polyethylene glycol (MIRALAX / GLYCOLAX) 17 g packet, Take 17 g by mouth daily. Can take up  to twice a day for constipation. (Patient not taking: Reported on 01/19/2021), Disp: 30 each, Rfl: 3 ? ? ? ?Review of Systems  ?Constitutional:  Negative for activity change, appetite change, chills, diaphoresis, fatigue, fever and unexpected weight change.  ?HENT:  Negative for congestion, rhinorrhea, sinus pressure, sneezing, sore throat and trouble swallowing.   ?Eyes:  Negative for photophobia and visual disturbance.  ?Respiratory:  Negative for cough, chest tightness, shortness of breath, wheezing and stridor.   ?Cardiovascular:  Negative for chest pain, palpitations and leg swelling.  ?Gastrointestinal:  Positive for abdominal pain. Negative for abdominal distention, anal bleeding, blood in stool, constipation, diarrhea, nausea and vomiting.  ?Genitourinary:  Negative for difficulty urinating, dysuria, flank pain and hematuria.  ?Musculoskeletal:  Negative for arthralgias, back pain, gait problem, joint swelling and myalgias.  ?Skin:  Negative for color change, pallor, rash and wound.  ?Neurological:  Negative for dizziness, tremors, weakness and light-headedness.  ?Hematological:  Negative for adenopathy. Does not bruise/bleed easily.  ?Psychiatric/Behavioral:  Negative for agitation, behavioral problems, confusion, decreased concentration, dysphoric mood and sleep disturbance.   ? ?   ?Objective:  ? Physical  Exam ?Constitutional:   ?   General: She is not in acute distress. ?   Appearance: Normal appearance. She is well-developed. She is not ill-appearing or diaphoretic.  ?HENT:  ?   Head: Normocephalic and atraumatic

## 2021-06-10 ENCOUNTER — Ambulatory Visit: Payer: Medicare Other | Admitting: Infectious Disease

## 2021-06-21 ENCOUNTER — Other Ambulatory Visit: Payer: Self-pay

## 2021-06-21 ENCOUNTER — Ambulatory Visit (INDEPENDENT_AMBULATORY_CARE_PROVIDER_SITE_OTHER): Payer: Medicare Other | Admitting: Infectious Disease

## 2021-06-21 ENCOUNTER — Encounter: Payer: Self-pay | Admitting: Infectious Disease

## 2021-06-21 VITALS — BP 113/80 | HR 94 | Temp 97.7°F | Ht 61.0 in | Wt 198.0 lb

## 2021-06-21 DIAGNOSIS — A048 Other specified bacterial intestinal infections: Secondary | ICD-10-CM

## 2021-06-21 DIAGNOSIS — R1013 Epigastric pain: Secondary | ICD-10-CM

## 2021-06-21 DIAGNOSIS — Z96651 Presence of right artificial knee joint: Secondary | ICD-10-CM

## 2021-06-21 DIAGNOSIS — Z9889 Other specified postprocedural states: Secondary | ICD-10-CM

## 2021-06-21 DIAGNOSIS — I824Z1 Acute embolism and thrombosis of unspecified deep veins of right distal lower extremity: Secondary | ICD-10-CM

## 2021-06-21 DIAGNOSIS — Z96659 Presence of unspecified artificial knee joint: Secondary | ICD-10-CM | POA: Insufficient documentation

## 2021-06-21 DIAGNOSIS — L748 Other eccrine sweat disorders: Secondary | ICD-10-CM

## 2021-06-21 HISTORY — DX: Epigastric pain: R10.13

## 2021-06-21 HISTORY — DX: Presence of unspecified artificial knee joint: Z96.659

## 2021-06-21 NOTE — Progress Notes (Signed)
? ?Subjective:  ? ?Chief complaint: still suffering from epigastric pain, bad smelling feet  ? ? Patient ID: Brittney Carter, female    DOB: 11/30/1960, 61 y.o.   MRN: [id number] ? ?HPI ? 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 ? ?She has had Helicobacter pylori diagnosed via EGD in November 2022. ? ?The patient was prescribed amoxicillin 1 g twice daily along with clarithromycin 500 mg twice daily metronidazole 500 mg twice daily and omeprazole 20 mg twice daily for 14 days.   ? ?She said she felt initial improvement in her epigastric pain but this then recurred and she was retested for Helicobacter pylori and again positive via stool antigen testing. ? ?She was now treated with metronidazole 200 mg 4 times daily along with doxycycline 100 mg twice daily Pepto-Bismol 2 tabs 4 times daily and omeprazole 20 twice daily. ? ?Unfortunately yet again she failed to respond to repeat stool testing was positive for Helicobacter antigen yet again. ? ?We gave her a regimen of amoxicillin 750 mg TID levaquin 500 mg daily and tonics 80 twice daily. ? ?She completed this but has had no improvement in her stomach pain her epigastric pain is still 10 out of 10 in severity her stool smells odd smell that she has noticed over the last several months as she had been diagnosed with Helicobacter pylori she also has noticed that her feet have been smelling badly over the last several months to which she read on Google was associated H. pylori. ? ? ? ?Past Medical History:  ?Diagnosis Date  ? Anxiety   ? Arthritis   ? Back  ? Depression   ? no medication  ? DVT (deep venous thrombosis) ([place])   ? Dyspnea   ? with exertion   ? Helicobacter pylori (H. pylori) infection 05/31/2021  ? ? ?Past Surgical History:  ?Procedure Laterality Date  ? BACK SURGERY  2016  ? Laminectomy  ? BACK SURGERY   1991  ? Disectomy  ? BIOPSY  01/25/2021  ? Procedure: BIOPSY;  Surgeon: Jackquline Denmark, MD;  Location: [facility];  Service: Gastroenterology;;  ? BREAST SURGERY    ? Breast lift  ? COLONOSCOPY  10/14/2016  ? Dr Orlena Sheldon. Benign neoplasm of ascending colon. Anal canal bleeding  ? COLONOSCOPY W/ POLYPECTOMY    ? x 2  ? COLONOSCOPY WITH PROPOFOL N/A 01/25/2021  ? Procedure: COLONOSCOPY WITH PROPOFOL;  Surgeon: Jackquline Denmark, MD;  Location: [facility];  Service: Gastroenterology;  Laterality: N/A;  ? ent surgery    ? ESOPHAGOGASTRODUODENOSCOPY  10/14/2016  ? Dr Orlena Sheldon. Normal EGD to second portion of duodenum  ? ESOPHAGOGASTRODUODENOSCOPY (EGD) WITH PROPOFOL N/A 01/25/2021  ? Procedure: ESOPHAGOGASTRODUODENOSCOPY (EGD) WITH PROPOFOL;  Surgeon: Jackquline Denmark, MD;  [facility]: [facility];  Service: Gastroenterology;  Laterality: N/A;  ? MALONEY DILATION  01/25/2021  ? Procedure: MALONEY DILATION;  Surgeon: Jackquline Denmark, MD;  Location: [facility];  Service: Gastroenterology;;  ? Pain stimulator trail    ? POLYPECTOMY  01/25/2021  ? Procedure: POLYPECTOMY;  Surgeon: Jackquline Denmark, MD;  Location: [facility];  Service: Gastroenterology;;  ? SPINAL CORD STIMULATOR INSERTION N/A 11/24/2016  ? Procedure: LUMBAR SPINAL CORD STIMULATOR INSERTION;  Surgeon: Melina Schools, MD;  Location: [facility];  Service: Orthopedics;  Laterality: N/A;  2.5 hrs  ? SPINAL CORD STIMULATOR REMOVAL N/A 06/28/2017  ? Procedure: Spinal cord stimulator battery removal;  Surgeon: Melina Schools, MD;  Location: [facility];  Service: Orthopedics;  Laterality: N/A;  ? TONSILLECTOMY    ? age 22  ? TOTAL KNEE ARTHROPLASTY Right 03/19/2021  ? Procedure: TOTAL KNEE ARTHROPLASTY;  Surgeon: Dorna Leitz, MD;  Location: [facility];  Service: Orthopedics;  Laterality: Right;  ? TUBAL LIGATION    ? ? ?Family History  ?Problem Relation Age of Onset  ? Breast cancer Sister   ? Colon cancer Brother   ?     died at 58  ? Rectal cancer Neg Hx   ? Esophageal cancer Neg Hx   ?  Stomach cancer Neg Hx   ? ? ?  ?Social History  ? ?Socioeconomic History  ? Marital status: Divorced  ?  Spouse name: Not on file  ? Number of children: 2  ? Years of education: Not on file  ? Highest education level: Not on file  ?Occupational History  ? Not on file  ?Tobacco Use  ? Smoking status: Never  ? Smokeless tobacco: Never  ?Vaping Use  ? Vaping Use: Never used  ?Substance and Sexual Activity  ? Alcohol use: No  ? Drug use: No  ? Sexual activity: Not on file  ?Other Topics Concern  ? Not on file  ?Social History Narrative  ? Not on file  ? ?Social Determinants of Health  ? ?Financial Resource Strain: Not on file  ?Food Insecurity: No Food Insecurity  ? Worried About Charity fundraiser in the Last Year: Never true  ? Ran Out of Food in the Last Year: Never true  ?Transportation Needs: No Transportation Needs  ? Lack of Transportation (Medical): No  ? Lack of Transportation (Non-Medical): No  ?Physical Activity: Not on file  ?Stress: Not on file  ?Social Connections: Not on file  ? ? ?Allergies  ?Allergen Reactions  ? Gabapentin Shortness Of Breath and Rash  ? Hydrocodone-Ibuprofen Hives, Shortness Of Breath, Nausea And Vomiting and Swelling  ?  Severe Headache   ? Morphine Anaphylaxis, Hives, Shortness Of Breath and Rash  ? Tramadol Anaphylaxis, Swelling and Rash  ?  headache  ? ? ? ?Current Outpatient Medications:  ?  amoxicillin (AMOXIL) 250 MG capsule, Take 1 capsule by mouth every 8 hours for 2 weeks., Disp: 42 capsule, Rfl: 0 ?  amoxicillin (AMOXIL) 500 MG capsule, Take 1 capsule by mouth every 8 hours. Combine with amoxicillin 286m for a total of 750 mg., Disp: 42 capsule, Rfl: 0 ?  apixaban (ELIQUIS) 5 MG TABS tablet, Take 5 mg by mouth 2 (two) times daily., Disp: , Rfl:  ?  ARIPiprazole (ABILIFY) 5 MG tablet, Take 2.5 mg by mouth at bedtime., Disp: , Rfl:  ?  bisacodyl (DULCOLAX) 5 MG EC tablet, Take 1 tablet (5 mg total) by mouth daily as needed for moderate constipation., Disp: 30 tablet, Rfl:  1 ?  Carboxymethylcellulose Sodium (THERATEARS) 0.25 % SOLN, Place 1 drop into both eyes daily as needed (dry eyes)., Disp: , Rfl:  ?  cyclobenzaprine (FLEXERIL) 5 MG tablet, Take 5 mg by mouth 3 (three) times daily as needed., Disp: , Rfl:  ?  dicyclomine (BENTYL) 10 MG capsule, Take 1 capsule (10 mg total) by mouth 2 (two) times daily. Take half an hour before lunch and supper:, Disp: 90 capsule, Rfl: 0 ?  FEROSUL 325 (65 Fe) MG tablet, Take 325 mg by mouth daily., Disp: , Rfl:  ?  omeprazole (PRILOSEC) 20 MG capsule, TAKE 1 CAPSULE(20 MG) BY MOUTH  DAILY (Patient not taking: Reported on 05/31/2021), Disp: 90 capsule, Rfl: 1 ?  oxyCODONE-acetaminophen (PERCOCET/ROXICET) 5-325 MG tablet, Take 1-2 tablets by mouth every 6 (six) hours as needed for severe pain. (Patient not taking: Reported on 05/31/2021), Disp: 40 tablet, Rfl: 0 ?  pantoprazole (PROTONIX) 40 MG tablet, Take 2 tablets (80 mg total) by mouth 2 times daily at 12 noon and 4 pm., Disp: 30 tablet, Rfl: 0 ?  polyethylene glycol (MIRALAX / GLYCOLAX) 17 g packet, Take 17 g by mouth daily. Can take up to twice a day for constipation. (Patient not taking: Reported on 01/19/2021), Disp: 30 each, Rfl: 3 ?  rosuvastatin (CRESTOR) 20 MG tablet, Take 20 mg by mouth daily., Disp: , Rfl:  ?  sertraline (ZOLOFT) 50 MG tablet, Take 50 mg by mouth daily., Disp: , Rfl:  ?  tiZANidine (ZANAFLEX) 2 MG tablet, Take 1 tablet (2 mg total) by mouth every 8 (eight) hours as needed for muscle spasms., Disp: 40 tablet, Rfl: 0 ?  traZODone (DESYREL) 50 MG tablet, Take 50 mg by mouth at bedtime., Disp: , Rfl:  ? ? ? ?Review of Systems  ?Constitutional:  Negative for activity change, appetite change, chills, diaphoresis, fatigue, fever and unexpected weight change.  ?HENT:  Negative for congestion, rhinorrhea, sinus pressure, sneezing, sore throat and trouble swallowing.   ?Eyes:  Negative for photophobia and visual disturbance.  ?Respiratory:  Negative for cough, chest tightness,  shortness of breath, wheezing and stridor.   ?Cardiovascular:  Negative for chest pain, palpitations and leg swelling.  ?Gastrointestinal:  Positive for abdominal pain and diarrhea. Negative for abdominal d

## 2021-06-30 ENCOUNTER — Other Ambulatory Visit: Payer: Medicare Other

## 2021-06-30 ENCOUNTER — Other Ambulatory Visit: Payer: Self-pay

## 2021-06-30 DIAGNOSIS — A048 Other specified bacterial intestinal infections: Secondary | ICD-10-CM

## 2021-07-01 LAB — HELICOBACTER PYLORI  SPECIAL ANTIGEN
MICRO NUMBER:: 13317961
RESULT:: DETECTED — AB
SPECIMEN QUALITY: ADEQUATE

## 2021-07-02 ENCOUNTER — Telehealth: Payer: Self-pay

## 2021-07-02 NOTE — Telephone Encounter (Signed)
Of course. I will start looking at her to see where we can go from here

## 2021-07-02 NOTE — Telephone Encounter (Signed)
Patient is calling requesting results for her H Pylori Stool test. Please advise ?

## 2021-07-07 NOTE — Telephone Encounter (Signed)
Patient called back regarding results. Requested appointment to discuss concerns.  ?Is scheduled for tomorrow at 9. ?Leatrice Jewels, RMA  ? ?

## 2021-07-07 NOTE — Telephone Encounter (Signed)
Reached out to patient by phone to discuss lab results and treatment- No answer, voicemail left for her to call back and mychart message sent  ?

## 2021-07-08 ENCOUNTER — Ambulatory Visit (INDEPENDENT_AMBULATORY_CARE_PROVIDER_SITE_OTHER): Payer: Medicare Other | Admitting: Infectious Disease

## 2021-07-08 ENCOUNTER — Other Ambulatory Visit: Payer: Self-pay

## 2021-07-08 VITALS — BP 126/81 | HR 80 | Resp 16 | Ht 61.0 in | Wt 202.0 lb

## 2021-07-08 DIAGNOSIS — A048 Other specified bacterial intestinal infections: Secondary | ICD-10-CM

## 2021-07-08 DIAGNOSIS — I2699 Other pulmonary embolism without acute cor pulmonale: Secondary | ICD-10-CM | POA: Diagnosis not present

## 2021-07-08 DIAGNOSIS — Z9889 Other specified postprocedural states: Secondary | ICD-10-CM

## 2021-07-08 MED ORDER — AMOXICILLIN 250 MG PO CAPS: 750.0000 mg | ORAL_CAPSULE | Freq: Three times a day (TID) | ORAL | 0 refills | Status: AC

## 2021-07-08 MED ORDER — PANTOPRAZOLE SODIUM 40 MG PO TBEC: 80.0000 mg | DELAYED_RELEASE_TABLET | Freq: Two times a day (BID) | ORAL | 0 refills | Status: DC

## 2021-07-08 MED ORDER — RIFABUTIN 150 MG PO CAPS: 300.0000 mg | ORAL_CAPSULE | Freq: Every day | ORAL | 0 refills | Status: AC

## 2021-07-08 NOTE — Progress Notes (Signed)
? ?Subjective:  ? ?Chief complaint: Still suffering from epigastric pain ? Patient ID: Brittney Carter, female    DOB: 10-Mar-1960, 61 y.o.   MRN: [id number] ? ?HPI ? 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 ? ?She has had Helicobacter pylori diagnosed via EGD in November 2022. ? ?The patient was prescribed amoxicillin 1 g twice daily along with clarithromycin 500 mg twice daily metronidazole 500 mg twice daily and omeprazole 20 mg twice daily for 14 days.   ? ?She said she felt initial improvement in her epigastric pain but this then recurred and she was retested for Helicobacter pylori and again positive via stool antigen testing. ? ?She was now treated with metronidazole 200 mg 4 times daily along with doxycycline 100 mg twice daily Pepto-Bismol 2 tabs 4 times daily and omeprazole 20 twice daily. ? ?Unfortunately yet again she failed to respond to repeat stool testing was positive for Helicobacter antigen yet again. ? ?We gave her a regimen of amoxicillin 750 mg TID levaquin 500 mg daily and tonics 80 twice daily. ? ?She completed this but has had no improvement in her stomach pain her epigastric pain is still 10 out of 10 in severity her stool smells odd smell that she has noticed over the last several months as she had been diagnosed with Helicobacter pylori she also has noticed that her feet have been smelling badly over the last several months to which she read on Google was associated H. pylori. ? ?Her H. pylori antigen indeed came back positive yet again.  She returns to clinic to see about further treatment. ? ?She has had amoxicillin yet again with a dental infection and had to have some teeth removed and having an implant placed. ? ? ? ? ? ?Past Medical History:  ?Diagnosis Date  ? Anxiety   ? Arthritis   ? Back  ? Depression   ? no medication  ? DVT  (deep venous thrombosis) ([place])   ? Dyspnea   ? with exertion   ? Epigastric pain 06/21/2021  ? Helicobacter pylori (H. pylori) infection 05/31/2021  ? History of total knee arthroplasty 06/21/2021  ? ? ?Past Surgical History:  ?Procedure Laterality Date  ? BACK SURGERY  2016  ? Laminectomy  ? BACK SURGERY  1991  ? Disectomy  ? BIOPSY  01/25/2021  ? Procedure: BIOPSY;  Surgeon: Jackquline Denmark, MD;  Location: [facility];  Service: Gastroenterology;;  ? BREAST SURGERY    ? Breast lift  ? COLONOSCOPY  10/14/2016  ? Dr Orlena Sheldon. Benign neoplasm of ascending colon. Anal canal bleeding  ? COLONOSCOPY W/ POLYPECTOMY    ? x 2  ? COLONOSCOPY WITH PROPOFOL N/A 01/25/2021  ? Procedure: COLONOSCOPY WITH PROPOFOL;  Surgeon: Jackquline Denmark, MD;  Location: [facility];  Service: Gastroenterology;  Laterality: N/A;  ? ent surgery    ? ESOPHAGOGASTRODUODENOSCOPY  10/14/2016  ? Dr Orlena Sheldon. Normal EGD to second portion of duodenum  ? ESOPHAGOGASTRODUODENOSCOPY (EGD) WITH PROPOFOL N/A 01/25/2021  ? Procedure: ESOPHAGOGASTRODUODENOSCOPY (EGD) WITH PROPOFOL;  Surgeon: Jackquline Denmark, MD;  Location: [facility];  Service: Gastroenterology;  Laterality: N/A;  ? MALONEY DILATION  01/25/2021  ? Procedure: MALONEY DILATION;  Surgeon: Jackquline Denmark, MD;  Location: [facility];  Service: Gastroenterology;;  ? Pain stimulator trail    ? POLYPECTOMY  01/25/2021  ? Procedure: POLYPECTOMY;  Surgeon: Jackquline Denmark, MD;  Location: [facility];  Service: Gastroenterology;;  ? SPINAL CORD STIMULATOR INSERTION N/A 11/24/2016  ? Procedure: LUMBAR SPINAL CORD STIMULATOR INSERTION;  Surgeon: Melina Schools, MD;  Location: [facility];  Service: Orthopedics;  Laterality: N/A;  2.5 hrs  ? SPINAL CORD STIMULATOR REMOVAL N/A 06/28/2017  ? Procedure: Spinal cord stimulator battery removal;  Surgeon: Melina Schools, MD;  Location: [facility];  Service: Orthopedics;  Laterality: N/A;  ? TONSILLECTOMY    ? age 79  ? TOTAL KNEE ARTHROPLASTY Right 03/19/2021  ? Procedure: TOTAL  KNEE ARTHROPLASTY;  Surgeon: Dorna Leitz, MD;  Location: [facility];  Service: Orthopedics;  Laterality: Right;  ? TUBAL LIGATION    ? ? ?Family History  ?Problem Relation Age of Onset  ? Breast cancer Sister   ? Colon cancer Brother   ?     died at 40  ? Rectal cancer Neg Hx   ? Esophageal cancer Neg Hx   ? Stomach cancer Neg Hx   ? ? ?  ?Social History  ? ?Socioeconomic History  ? Marital status: Divorced  ?  Spouse name: Not on file  ? Number of children: 2  ? Years of education: Not on file  ? Highest education level: Not on file  ?Occupational History  ? Not on file  ?Tobacco Use  ? Smoking status: Never  ? Smokeless tobacco: Never  ?Vaping Use  ? Vaping Use: Never used  ?Substance and Sexual Activity  ? Alcohol use: No  ? Drug use: No  ? Sexual activity: Not on file  ?Other Topics Concern  ? Not on file  ?Social History Narrative  ? Not on file  ? ?Social Determinants of Health  ? ?Financial Resource Strain: Not on file  ?Food Insecurity: No Food Insecurity  ? Worried About Charity fundraiser in the Last Year: Never true  ? Ran Out of Food in the Last Year: Never true  ?Transportation Needs: No Transportation Needs  ? Lack of Transportation (Medical): No  ? Lack of Transportation (Non-Medical): No  ?Physical Activity: Not on file  ?Stress: Not on file  ?Social Connections: Not on file  ? ? ?Allergies  ?Allergen Reactions  ? Gabapentin Shortness Of Breath and Rash  ? Hydrocodone-Ibuprofen Hives, Shortness Of Breath, Nausea And Vomiting and Swelling  ?  Severe Headache   ? Morphine Anaphylaxis, Hives, Shortness Of Breath and Rash  ? Tramadol Anaphylaxis, Swelling and Rash  ?  headache  ? ? ? ?Current Outpatient Medications:  ?  amoxicillin (AMOXIL) 500 MG capsule, Take 1 capsule by mouth every 8 hours. Combine with amoxicillin 268m for a total of 750 mg. (Patient not taking: Reported on 06/21/2021), Disp: 42 capsule, Rfl: 0 ?  apixaban (ELIQUIS) 5 MG TABS tablet, Take 5 mg by mouth 2 (two) times daily., Disp: ,  Rfl:  ?  ARIPiprazole (ABILIFY) 5 MG tablet, Take 2.5 mg by mouth at bedtime., Disp: , Rfl:  ?  bisacodyl (DULCOLAX) 5 MG EC tablet, Take 1 tablet (5 mg total) by mouth daily as needed for moderate constipation. (Patient not taking: Reported on 06/21/2021), Disp: 30 tablet, Rfl: 1 ?  Carboxymethylcellulose Sodium (THERATEARS) 0.25 % SOLN, Place 1 drop into both eyes daily as needed (dry eyes)., Disp: , Rfl:  ?  cyclobenzaprine (FLEXERIL) 5 MG tablet, Take 5 mg by mouth 3 (three) times daily as needed. (Patient not taking: Reported on 06/21/2021), Disp: , Rfl:  ?  dicyclomine (BENTYL) 10 MG capsule, Take 1 capsule (10 mg total)  by mouth 2 (two) times daily. Take half an hour before lunch and supper: (Patient not taking: Reported on 06/21/2021), Disp: 90 capsule, Rfl: 0 ?  FEROSUL 325 (65 Fe) MG tablet, Take 325 mg by mouth daily., Disp: , Rfl:  ?  oxyCODONE-acetaminophen (PERCOCET/ROXICET) 5-325 MG tablet, Take 1-2 tablets by mouth every 6 (six) hours as needed for severe pain. (Patient not taking: Reported on 05/31/2021), Disp: 40 tablet, Rfl: 0 ?  phentermine (ADIPEX-P) 37.5 MG tablet, Take 37.5 mg by mouth daily., Disp: , Rfl:  ?  polyethylene glycol (MIRALAX / GLYCOLAX) 17 g packet, Take 17 g by mouth daily. Can take up to twice a day for constipation. (Patient not taking: Reported on 01/19/2021), Disp: 30 each, Rfl: 3 ?  rosuvastatin (CRESTOR) 20 MG tablet, Take 20 mg by mouth daily., Disp: , Rfl:  ?  sertraline (ZOLOFT) 50 MG tablet, Take 50 mg by mouth daily., Disp: , Rfl:  ?  tiZANidine (ZANAFLEX) 2 MG tablet, Take 1 tablet (2 mg total) by mouth every 8 (eight) hours as needed for muscle spasms. (Patient not taking: Reported on 06/21/2021), Disp: 40 tablet, Rfl: 0 ?  traZODone (DESYREL) 50 MG tablet, Take 50 mg by mouth at bedtime., Disp: , Rfl:  ? ? ? ?Review of Systems  ?Constitutional:  Negative for activity change, appetite change, chills, diaphoresis, fatigue, fever and unexpected weight change.  ?HENT:   Positive for dental problem. Negative for congestion, rhinorrhea, sinus pressure, sneezing, sore throat and trouble swallowing.   ?Eyes:  Negative for photophobia and visual disturbance.  ?Respiratory:  Negative

## 2021-07-08 NOTE — Progress Notes (Signed)
Spoke with patient regarding salvage therapy for Helicobacter pylori infection. She will start rifabutin triple therapy with rifabutin 300 mg once daily + amoxicillin 750 mg TID + pantoprazole 40 mg BID x 14 days. Counseled on how to take these and what to expect including possible GI upset with rifabutin and discoloration of sweat, saliva, contacts, and tears. Asked her to take rifabutin with food to help with tolerability and potential nausea. Will send scripts to [facility] in [place] per patient request. She will call if she has any issues and follow up with Dr. Tommy Medal in 1 month.  ? ?Patrecia Veiga L. Sidni Fusco, PharmD, BCIDP, AAHIVP, CPP ?Clinical Pharmacist Practitioner ?Infectious Diseases Clinical Pharmacist ?[facility] ?07/08/2021, 11:28 AM ? ? ?

## 2021-07-09 ENCOUNTER — Telehealth: Payer: Self-pay

## 2021-07-09 DIAGNOSIS — A048 Other specified bacterial intestinal infections: Secondary | ICD-10-CM

## 2021-07-09 NOTE — Telephone Encounter (Signed)
Patient called stating "insurance is having a problem" with one of the medications Dr. Tommy Medal prescribed to treat her H. Pylori infection. ? ?Called [facility] in [place], who stated that the rifabutin and amoxicillan are available, but the patient's insurance is requiring a prior authorization for the pantoprazole. The pharmacy did not have pantoprazole available as an OTC medication. ? ?Pharmacy is faxing the prior authorization, but pharmacist Lennette Bihari also provided the following verbally: ? ?Insurance: [employer] Medicare Part D ?Bin [id number] ?CCN [id number] ?ID [id number] ?Person Code 01 ?Group COS ? ?Called and spoke with Bronson Curb, per discussion with [facility] pharmacist Estill Bamberg. Counseled patient to not start taking any medications for H.pylori treatment, until she has all 3 medications needed for the regimen as prescribed by Dr. Tommy Medal. Told patient we would initiate the prior authorization. ? ?Patient stated understanding and had no additional questions. ? ?Binnie Kand, RN  ? ?

## 2021-07-12 ENCOUNTER — Other Ambulatory Visit (HOSPITAL_COMMUNITY): Payer: Self-pay

## 2021-07-12 MED ORDER — PANTOPRAZOLE SODIUM 40 MG PO TBEC
80.0000 mg | DELAYED_RELEASE_TABLET | Freq: Two times a day (BID) | ORAL | 0 refills | Status: AC
  Filled 2021-07-12: qty 56, 14d supply, fill #0

## 2021-07-12 NOTE — Addendum Note (Signed)
Addended by: Lucie Leather D on: 07/12/2021 09:44 AM ? ? Modules accepted: Orders ? ?

## 2021-07-12 NOTE — Telephone Encounter (Signed)
[facility] states they need a PA for the pantoprazole. Per Ileene Patrick, medication is $0 if filled through [facility]. Notified patient, she will set up delivery with [facility].  ? ?Cancelled Rx at [facility] and sent to [facility].  ? ? ?Beryle Flock, RN ? ?

## 2021-07-14 ENCOUNTER — Telehealth: Payer: Self-pay

## 2021-07-14 NOTE — Telephone Encounter (Signed)
Yes, her contact lenses can be stained orange/red. Would recommend wearing glasses for duration of treatment. Brittney Carter

## 2021-07-14 NOTE — Telephone Encounter (Signed)
Patient called and stated that this morning she has started taking the rifabutin/amoxicillan/pantoprazole regimen prescribed.  ? ?States that it has already started turning her urine orange/red. Patient is concerned about whether this regimen could also affect her contact lenses, and turn them orange.  ? ?Will route to pharmacist. Counseled patient to wear her glasses for now. ? ?Binnie Kand, RN  ?

## 2021-07-16 NOTE — Telephone Encounter (Signed)
Spoke with patient and relayed advice per Estill Bamberg, that patient should wear glasses for duration of treatment to avoid contacts being stained orange/red. ? ?Patient stated understanding and had no additional questions. ? ?Binnie Kand, RN  ?

## 2021-07-20 ENCOUNTER — Ambulatory Visit: Payer: Medicare Other | Admitting: Infectious Disease

## 2021-08-03 ENCOUNTER — Other Ambulatory Visit: Payer: Self-pay

## 2021-08-03 ENCOUNTER — Encounter: Payer: Self-pay | Admitting: Infectious Disease

## 2021-08-03 ENCOUNTER — Ambulatory Visit (INDEPENDENT_AMBULATORY_CARE_PROVIDER_SITE_OTHER): Payer: Medicare Other | Admitting: Infectious Disease

## 2021-08-03 VITALS — BP 129/81 | HR 79 | Temp 98.0°F | Ht 61.0 in | Wt 200.0 lb

## 2021-08-03 DIAGNOSIS — E669 Obesity, unspecified: Secondary | ICD-10-CM

## 2021-08-03 DIAGNOSIS — A048 Other specified bacterial intestinal infections: Secondary | ICD-10-CM | POA: Diagnosis not present

## 2021-08-03 DIAGNOSIS — Z6832 Body mass index (BMI) 32.0-32.9, adult: Secondary | ICD-10-CM

## 2021-08-03 DIAGNOSIS — I824Z1 Acute embolism and thrombosis of unspecified deep veins of right distal lower extremity: Secondary | ICD-10-CM

## 2021-08-03 NOTE — Progress Notes (Signed)
Subjective:   Chief complaint: Continued epigastric pain and dyspepsia  Patient ID: Brittney Carter, female    DOB: [birth year], 61 y.o.   MRN: [id number]  HPI  Brittney Carter is a 61 year old Caucasian woman with a history of postoperative pulmonary embolism after surgery last spring now currently on Eliquis prophylactically after recent orthopedic surgery.  She has had Helicobacter pylori diagnosed via EGD in November 2022.  The patient was prescribed amoxicillin 1 g twice daily along with clarithromycin 500 mg twice daily metronidazole 500 mg twice daily and omeprazole 20 mg twice daily for 14 days.    She said she felt initial improvement in her epigastric pain but this then recurred and she was retested for Helicobacter pylori and again positive via stool antigen testing.  She was now treated with metronidazole 200 mg 4 times daily along with doxycycline 100 mg twice daily Pepto-Bismol 2 tabs 4 times daily and omeprazole 20 twice daily.  Unfortunately yet again she failed to respond to repeat stool testing was positive for Helicobacter antigen yet again.  We gave her a regimen of amoxicillin 750 mg TID levaquin 500 mg daily and tonics 80 twice daily.  She completed this but has had no improvement in her stomach pain her epigastric pain is still 10 out of 10 in severity her stool smells odd smell that she has noticed over the last several months as she had been diagnosed with Helicobacter pylori she also has noticed that her feet have been smelling badly over the last several months to which she read on [employer] was associated H. pylori.  Her H. pylori antigen indeed came back positive yet again.    She has had amoxicillin yet again with a dental infection and had to have some teeth removed and having an implant placed.  We subsequently tried a regimen of rifabutin and amoxicillin and Protonix.  She completed 14 days of this on roughly May 18.  Unfortunately her symptoms have not improved  and potentially now if worsened off the PPI.  I she is quite worried about getting cancer      Past Medical History:  Diagnosis Date   Anxiety    Arthritis    Back   Depression    no medication   DVT (deep venous thrombosis) (HCC)    Dyspnea    with exertion    Epigastric pain 6/43/3295   Helicobacter pylori (H. 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 Benign neoplasm of ascending colon. Anal canal bleeding   COLONOSCOPY W/ POLYPECTOMY     x 2   COLONOSCOPY WITH PROPOFOL N/A 01/25/2021   Procedure: COLONOSCOPY WITH PROPOFOL;  Surgeon: Jackquline Denmark, MD;  Location: [facility];  Service: Gastroenterology;  Laterality: N/A;   ent surgery     ESOPHAGOGASTRODUODENOSCOPY  10/14/2016   Dr Orlena Sheldon. Normal EGD to second portion of duodenum   ESOPHAGOGASTRODUODENOSCOPY (EGD) WITH PROPOFOL N/A 01/25/2021   Procedure: ESOPHAGOGASTRODUODENOSCOPY (EGD) WITH PROPOFOL;  Surgeon: Jackquline Denmark, MD;  Location: [facility];  Service: Gastroenterology;  Laterality: N/A;   MALONEY DILATION  01/25/2021  Procedure: MALONEY DILATION;  Surgeon: Jackquline Denmark, MD;  Location: [facility];  Service: Gastroenterology;;   Pain stimulator trail     POLYPECTOMY  01/25/2021   Procedure: POLYPECTOMY;  Surgeon: Jackquline Denmark, MD;  Location: [facility];  Service: Gastroenterology;;   SPINAL CORD STIMULATOR INSERTION N/A 11/24/2016   Procedure: LUMBAR SPINAL CORD STIMULATOR INSERTION;  Surgeon: Melina Schools, MD;  Location: [place];  Service: Orthopedics;  Laterality: N/A;  2.5 hrs   SPINAL CORD STIMULATOR REMOVAL N/A 06/28/2017   Procedure: Spinal  cord stimulator battery removal;  Surgeon: Melina Schools, MD;  Location: [place];  Service: Orthopedics;  Laterality: N/A;   TONSILLECTOMY     age 52   TOTAL KNEE ARTHROPLASTY Right 03/19/2021   Procedure: TOTAL KNEE ARTHROPLASTY;  Surgeon: Dorna Leitz, MD;  Location: [facility];  Service: Orthopedics;  Laterality: Right;   TUBAL LIGATION      Family History  Problem Relation Age of Onset   Breast cancer Sister    Colon cancer Brother        died at 92   Rectal cancer Neg Hx    Esophageal cancer Neg Hx    Stomach cancer Neg Hx       Social History   Socioeconomic History   Marital status: Divorced    Spouse name: Not on file   Number of children: 2   Years of education: Not on file   Highest education level: Not on file  Occupational History   Not on file  Tobacco Use   Smoking status: Never   Smokeless tobacco: Never  Vaping Use   Vaping Use: Never used  Substance and Sexual Activity   Alcohol use: No   Drug use: No   Sexual activity: Not on file  Other Topics Concern   Not on file  Social History Narrative   Not on file   Social Determinants of Health   Financial Resource Strain: Not on file  Food Insecurity: No Food Insecurity   Worried About Running Out of Food in the Last Year: Never true   Ran Out of Food in the Last Year: Never true  Transportation Needs: No Transportation Needs   Lack of Transportation (Medical): No   Lack of Transportation (Non-Medical): No  Physical Activity: Not on file  Stress: Not on file  Social Connections: Not on file    Allergies  Allergen Reactions   Gabapentin Shortness Of Breath and Rash   Hydrocodone-Ibuprofen Hives, Shortness Of Breath, Nausea And Vomiting and Swelling    Severe Headache    Morphine Anaphylaxis, Hives, Shortness Of Breath and Rash   Tramadol Anaphylaxis, Swelling and Rash    headache     Current Outpatient Medications:    ARIPiprazole (ABILIFY) 5 MG tablet, Take 2.5 mg by mouth at bedtime., Disp: ,  Rfl:    Carboxymethylcellulose Sodium (THERATEARS) 0.25 % SOLN, Place 1 drop into both eyes daily as needed (dry eyes)., Disp: , Rfl:    Cholecalciferol (KP VITAMIN D3 PO), Take by mouth., Disp: , Rfl:    sertraline (ZOLOFT) 50 MG tablet, Take 50 mg by mouth daily., Disp: , Rfl:    traZODone (DESYREL) 50 MG tablet, Take 50 mg by mouth at bedtime., Disp: , Rfl:    amoxicillin (AMOXIL) 500 MG capsule, Take 1 capsule by mouth every 8 hours. Combine with amoxicillin 252m for a total of 750 mg. (Patient not taking: Reported on 06/21/2021), Disp: 42 capsule, Rfl: 0   apixaban (ELIQUIS) 5 MG TABS  tablet, Take 5 mg by mouth 2 (two) times daily. (Patient not taking: Reported on 07/08/2021), Disp: , Rfl:    bisacodyl (DULCOLAX) 5 MG EC tablet, Take 1 tablet (5 mg total) by mouth daily as needed for moderate constipation. (Patient not taking: Reported on 06/21/2021), Disp: 30 tablet, Rfl: 1   cyclobenzaprine (FLEXERIL) 5 MG tablet, Take 5 mg by mouth 3 (three) times daily as needed. (Patient not taking: Reported on 06/21/2021), Disp: , Rfl:    dicyclomine (BENTYL) 10 MG capsule, Take 1 capsule (10 mg total) by mouth 2 (two) times daily. Take half an hour before lunch and supper: (Patient not taking: Reported on 06/21/2021), Disp: 90 capsule, Rfl: 0   FEROSUL 325 (65 Fe) MG tablet, Take 325 mg by mouth daily. (Patient not taking: Reported on 07/08/2021), Disp: , Rfl:    oxyCODONE-acetaminophen (PERCOCET/ROXICET) 5-325 MG tablet, Take 1-2 tablets by mouth every 6 (six) hours as needed for severe pain. (Patient not taking: Reported on 05/31/2021), Disp: 40 tablet, Rfl: 0   pantoprazole (PROTONIX) 40 MG tablet, Take 2 tablets by mouth 2  times daily for 14 days., Disp: 56 tablet, Rfl: 0   phentermine (ADIPEX-P) 37.5 MG tablet, Take 37.5 mg by mouth daily. (Patient not taking: Reported on 08/03/2021), Disp: , Rfl:    polyethylene glycol (MIRALAX / GLYCOLAX) 17 g packet, Take 17 g by mouth daily. Can take up to twice a day for  constipation. (Patient not taking: Reported on 01/19/2021), Disp: 30 each, Rfl: 3   rosuvastatin (CRESTOR) 20 MG tablet, Take 20 mg by mouth daily. (Patient not taking: Reported on 08/03/2021), Disp: , Rfl:    tiZANidine (ZANAFLEX) 2 MG tablet, Take 1 tablet (2 mg total) by mouth every 8 (eight) hours as needed for muscle spasms. (Patient not taking: Reported on 06/21/2021), Disp: 40 tablet, Rfl: 0    Review of Systems  Constitutional:  Negative for activity change, appetite change, chills, diaphoresis, fatigue, fever and unexpected weight change.  HENT:  Negative for congestion, rhinorrhea, sinus pressure, sneezing, sore throat and trouble swallowing.   Eyes:  Negative for photophobia and visual disturbance.  Respiratory:  Negative for cough, chest tightness, shortness of breath, wheezing and stridor.   Cardiovascular:  Negative for chest pain, palpitations and leg swelling.  Gastrointestinal:  Positive for abdominal pain. Negative for abdominal distention, anal bleeding, blood in stool, constipation, diarrhea, nausea and vomiting.  Genitourinary:  Negative for difficulty urinating, dysuria, flank pain and hematuria.  Musculoskeletal:  Negative for arthralgias, back pain, gait problem, joint swelling and myalgias.  Skin:  Negative for color change, pallor, rash and wound.  Neurological:  Negative for dizziness, tremors, weakness and light-headedness.  Hematological:  Negative for adenopathy. Does not bruise/bleed easily.  Psychiatric/Behavioral:  Negative for agitation, behavioral problems, confusion, decreased concentration, dysphoric mood and sleep disturbance. The patient is nervous/anxious.       Objective:   Physical Exam Constitutional:      General: She is not in acute distress.    Appearance: Normal appearance. She is well-developed. She is not ill-appearing or diaphoretic.  HENT:     Head: Normocephalic and atraumatic.     Right Ear: Hearing and external ear normal.     Left Ear:  Hearing and external ear normal.     Nose: No nasal deformity or rhinorrhea.  Eyes:     General: No scleral icterus.    Conjunctiva/sclera: Conjunctivae normal.     Right eye: Right conjunctiva is not injected.  Left eye: Left conjunctiva is not injected.     Pupils: Pupils are equal, round, and reactive to light.  Neck:     Vascular: No JVD.  Cardiovascular:     Rate and Rhythm: Normal rate and regular rhythm.     Heart sounds: S1 normal and S2 normal.  Pulmonary:     Effort: Pulmonary effort is normal.     Breath sounds: No wheezing.  Abdominal:     General: Bowel sounds are normal. There is no distension.     Palpations: Abdomen is soft.     Tenderness: There is no abdominal tenderness.  Musculoskeletal:        General: Normal range of motion.     Right shoulder: Normal.     Left shoulder: Normal.     Cervical back: Normal range of motion and neck supple.     Right hip: Normal.     Left hip: Normal.     Right knee: Normal.     Left knee: Normal.  Lymphadenopathy:     Head:     Right side of head: No submandibular, preauricular or posterior auricular adenopathy.     Left side of head: No submandibular, preauricular or posterior auricular adenopathy.     Cervical: No cervical adenopathy.     Right cervical: No superficial or deep cervical adenopathy.    Left cervical: No superficial or deep cervical adenopathy.  Skin:    General: Skin is warm and dry.     Coloration: Skin is not pale.     Findings: No abrasion, bruising, ecchymosis, erythema, lesion or rash.     Nails: There is no clubbing.  Neurological:     General: No focal deficit present.     Mental Status: She is alert and oriented to person, place, and time.     Sensory: No sensory deficit.     Coordination: Coordination normal.     Gait: Gait normal.  Psychiatric:        Attention and Perception: Attention normal. She is attentive.        Mood and Affect: Mood is anxious.        Speech: Speech normal.         Behavior: Behavior normal. Behavior is cooperative.        Thought Content: Thought content normal.        Cognition and Memory: Cognition and memory normal.        Judgment: Judgment normal.          Assessment & Plan:   Refractory Helicobacter pylori infection: It sounds clinically history as she has failed yet another regimen.  We will check pylori stool antigen 4 weeks after her completion of her most recent regimen  I think were going to have to have an endoscopy with specimen sent on special lysed media for culture so we can send for susceptibilities I will reach out to her gastroenterologist here locally and hopefully can find a way to do this in [place] but if not then we will need to potentially get her to an academic center  History of PE currently not on Eliquis  Obesity; has been on phentermine.

## 2021-08-11 ENCOUNTER — Telehealth: Payer: Self-pay

## 2021-08-11 NOTE — Telephone Encounter (Signed)
Called and relayed message from Dr. Tommy Medal. Patient stated understanding that she could reach out to Dr. [name], primary care, or urgent care. Patient stated she will be bringing sample in next week. All questions answered.  Binnie Kand, RN

## 2021-08-11 NOTE — Telephone Encounter (Signed)
Patient called stating she is having worsening abdominal pain. Stated she has had ongoing pain with her H.pylori infection, but since Saturday the pain has increased to 10/10. Describes pain as cramping, "feels like I'm having contractions". Denied fever, chills, nausea, vomiting, or any other symptoms.   Patient is requesting advice and prescription for pain medication. States she has taken flexeril without any relief.  Message routed to provider and clinical pharmacists.  Binnie Kand, RN

## 2021-08-16 ENCOUNTER — Other Ambulatory Visit: Payer: Medicare Other

## 2021-08-16 ENCOUNTER — Other Ambulatory Visit: Payer: Self-pay

## 2021-08-16 DIAGNOSIS — A048 Other specified bacterial intestinal infections: Secondary | ICD-10-CM

## 2021-08-17 ENCOUNTER — Telehealth: Payer: Self-pay

## 2021-08-17 LAB — HELICOBACTER PYLORI  SPECIAL ANTIGEN
MICRO NUMBER:: 13514202
SPECIMEN QUALITY: ADEQUATE

## 2021-08-17 NOTE — Telephone Encounter (Signed)
Patient called office today requesting results for her H pylori lab. Per lab note  results came back negative.  Relayed this patient. Does not have any questions at this time Leatrice Jewels, RMA

## 2021-09-03 ENCOUNTER — Other Ambulatory Visit: Payer: Self-pay

## 2021-09-03 ENCOUNTER — Telehealth (INDEPENDENT_AMBULATORY_CARE_PROVIDER_SITE_OTHER): Payer: Medicare Other | Admitting: Infectious Disease

## 2021-09-03 DIAGNOSIS — A048 Other specified bacterial intestinal infections: Secondary | ICD-10-CM

## 2021-09-03 NOTE — Progress Notes (Signed)
Virtual Visit via Telephone Note  I connected with Brittney Carter on 09/03/21 at  9:45 AM EDT by telephone and verified that I am speaking with the correct person using two identifiers.  Location: Patient:  Home Provider: [facility]   I discussed the limitations, risks, security and privacy concerns of performing an evaluation and management service by telephone and the availability of in person appointments. I also discussed with the patient that there may be a patient responsible charge related to this service. The patient expressed understanding and agreed to proceed.   History of Present Illness:  Brittney Carter is a 61 year old Caucasian woman with a history of postoperative pulmonary embolism after surgery last spring now currently on Eliquis prophylactically after recent orthopedic surgery.  She has had Helicobacter pylori diagnosed via EGD in November 2022.  The patient was prescribed amoxicillin 1 g twice daily along with clarithromycin 500 mg twice daily metronidazole 500 mg twice daily and omeprazole 20 mg twice daily for 14 days.    She said she felt initial improvement in her epigastric pain but this then recurred and she was retested for Helicobacter pylori and again positive via stool antigen testing.  She was now treated with metronidazole 200 mg 4 times daily along with doxycycline 100 mg twice daily Pepto-Bismol 2 tabs 4 times daily and omeprazole 20 twice daily.  Unfortunately yet again she failed to respond to repeat stool testing was positive for Helicobacter antigen yet again.   We gave her a regimen of amoxicillin 750 mg TID levaquin 500 mg daily and tonics 80 twice daily.   When I first saw her for follow-up she had had no improvement in her symptoms and we were very skeptical she has been cured.  In the interim however she actually began to feel better with less abdominal pain and with the smell in her stool that she associates with H. pylori and having disappeared her  stool antigen indeed came back negative.  She is largely feeling better but she is again noticed the smell in her stool that she is associated with H. pylori and would like to be rechecked.     Observations/Objective:  Helicobacter pylori infection recurrent: I am hoping that she actually is cured and that we repeat her stool antigen a test is negative if she has not yet again I think will need repeat endoscopy and set an assay at least for macrolide resistance and endeavor to try to culture the organism.    Assessment and Plan:   Follow Up Instructions:    I discussed the assessment and treatment plan with the patient. The patient was provided an opportunity to ask questions and all were answered. The patient agreed with the plan and demonstrated an understanding of the instructions.   The patient was advised to call back or seek an in-person evaluation if the symptoms worsen or if the condition fails to improve as anticipated.  I provided  minutes of non-face-to-face time during this encounter.   Alcide Evener, MD

## 2021-09-15 ENCOUNTER — Other Ambulatory Visit: Payer: Medicare Other

## 2021-09-15 ENCOUNTER — Other Ambulatory Visit: Payer: Self-pay

## 2021-09-15 DIAGNOSIS — A048 Other specified bacterial intestinal infections: Secondary | ICD-10-CM

## 2021-09-16 LAB — HELICOBACTER PYLORI  SPECIAL ANTIGEN
MICRO NUMBER:: 13638899
SPECIMEN QUALITY: ADEQUATE

## 2022-09-12 DIAGNOSIS — E782 Mixed hyperlipidemia: Secondary | ICD-10-CM | POA: Diagnosis not present

## 2022-09-12 DIAGNOSIS — R7301 Impaired fasting glucose: Secondary | ICD-10-CM | POA: Diagnosis not present

## 2022-11-15 DIAGNOSIS — R7301 Impaired fasting glucose: Secondary | ICD-10-CM | POA: Diagnosis not present

## 2022-11-15 DIAGNOSIS — E782 Mixed hyperlipidemia: Secondary | ICD-10-CM | POA: Diagnosis not present

## 2022-11-17 DIAGNOSIS — E1169 Type 2 diabetes mellitus with other specified complication: Secondary | ICD-10-CM | POA: Diagnosis not present

## 2022-11-17 DIAGNOSIS — E785 Hyperlipidemia, unspecified: Secondary | ICD-10-CM | POA: Diagnosis not present

## 2022-11-17 DIAGNOSIS — N182 Chronic kidney disease, stage 2 (mild): Secondary | ICD-10-CM | POA: Diagnosis not present

## 2022-11-17 DIAGNOSIS — Z23 Encounter for immunization: Secondary | ICD-10-CM | POA: Diagnosis not present

## 2023-02-08 IMAGING — CR DG KNEE COMPLETE 4+V*R*
4 series · 4 of 4 positions shown · non-contrast
Comparison: February 02, 2020.

CLINICAL DATA: Right knee pain and swelling.

EXAM:
RIGHT KNEE - COMPLETE 4+ VIEW

[t knee ap right]
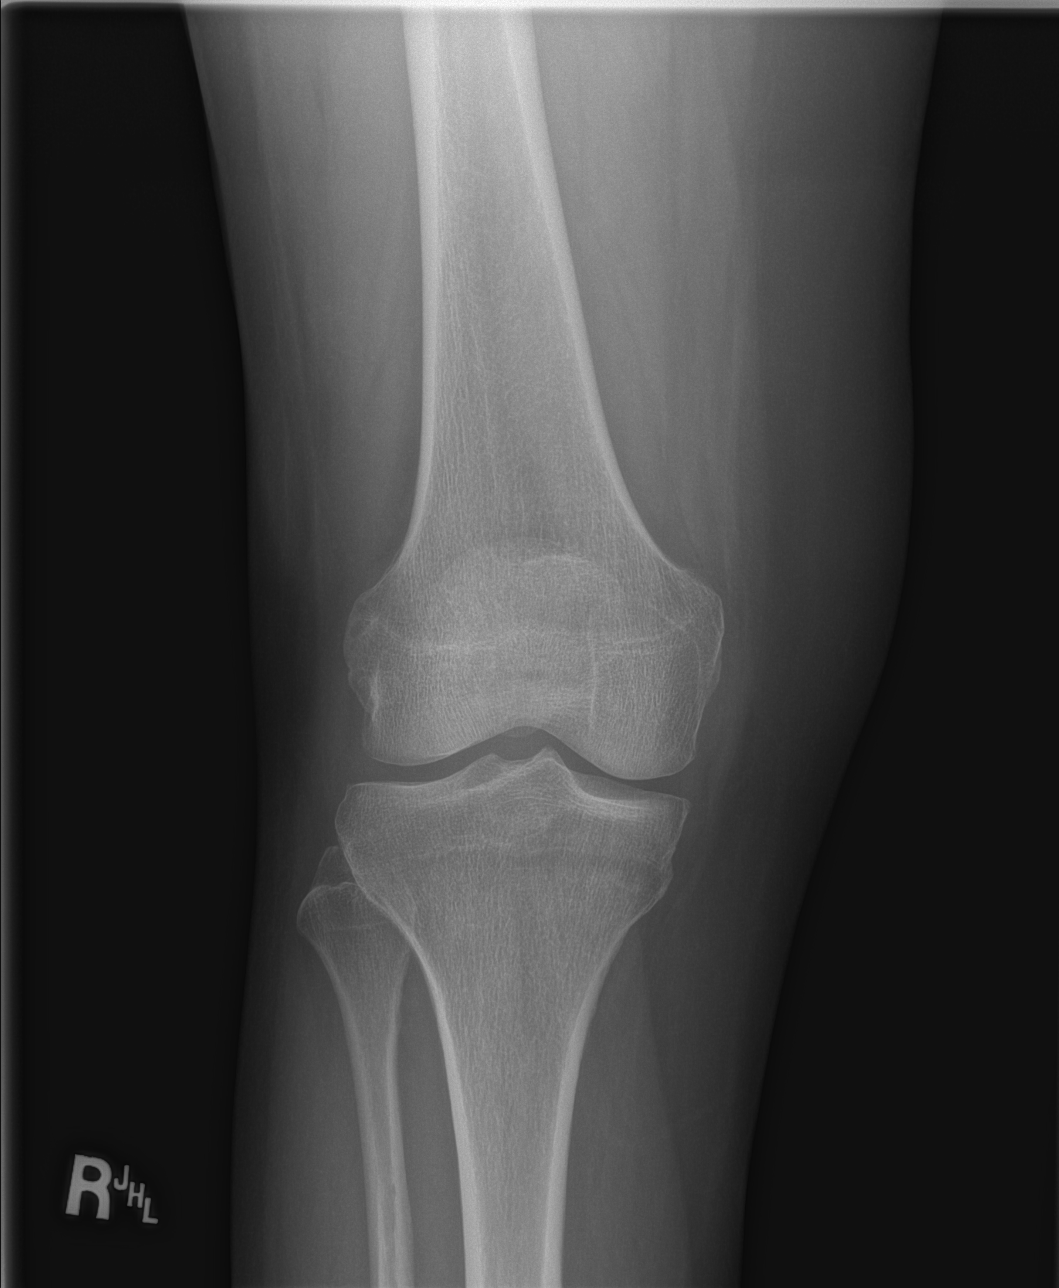

[t knee obl right (1 of 2)]
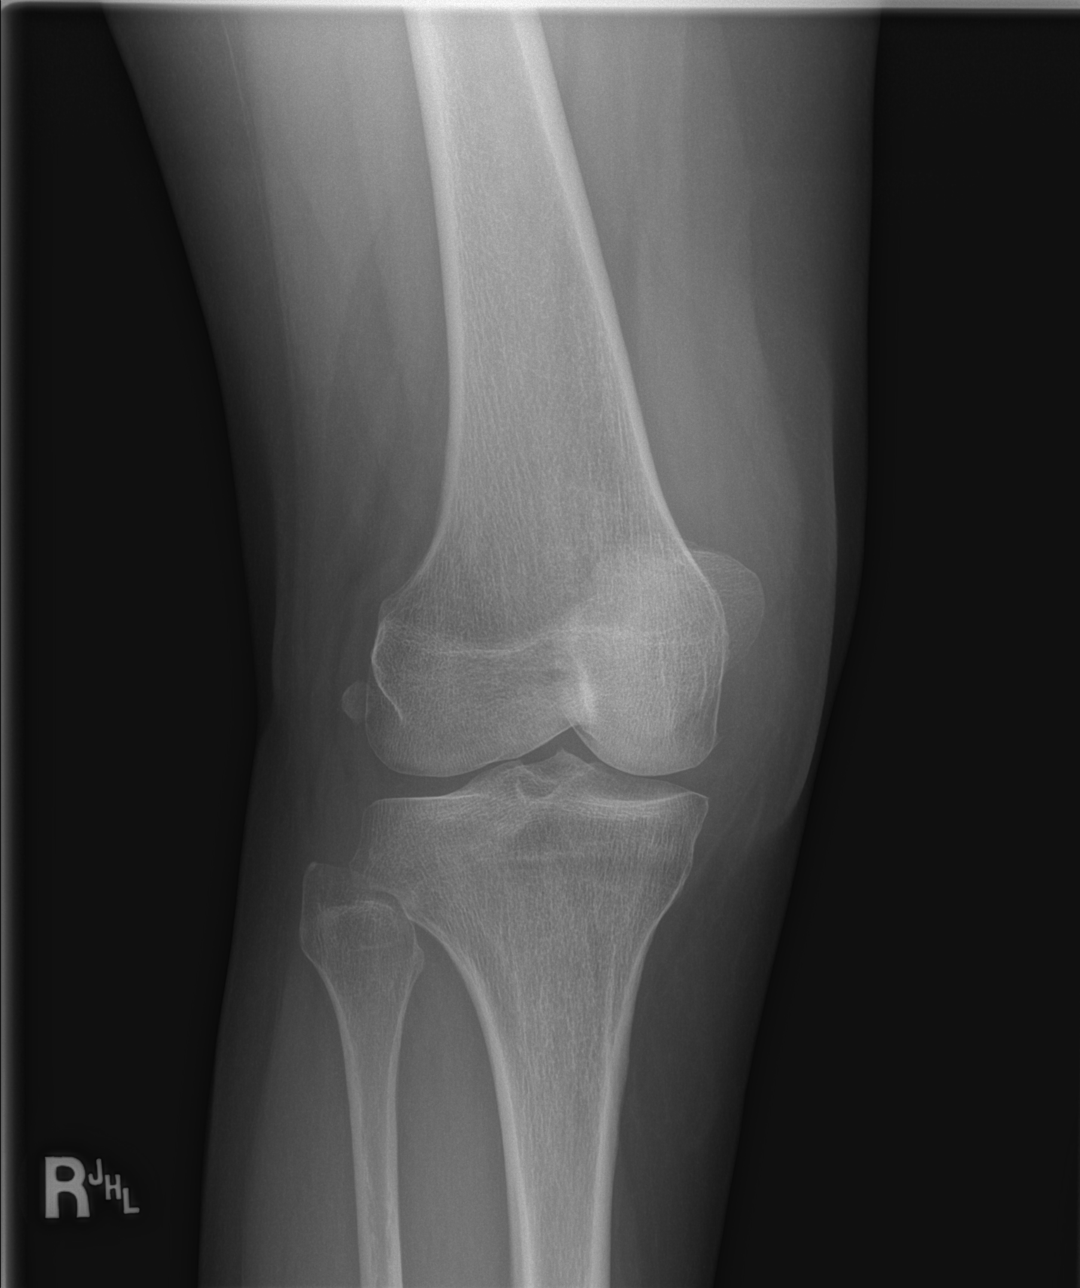

[t knee obl right (2 of 2)]
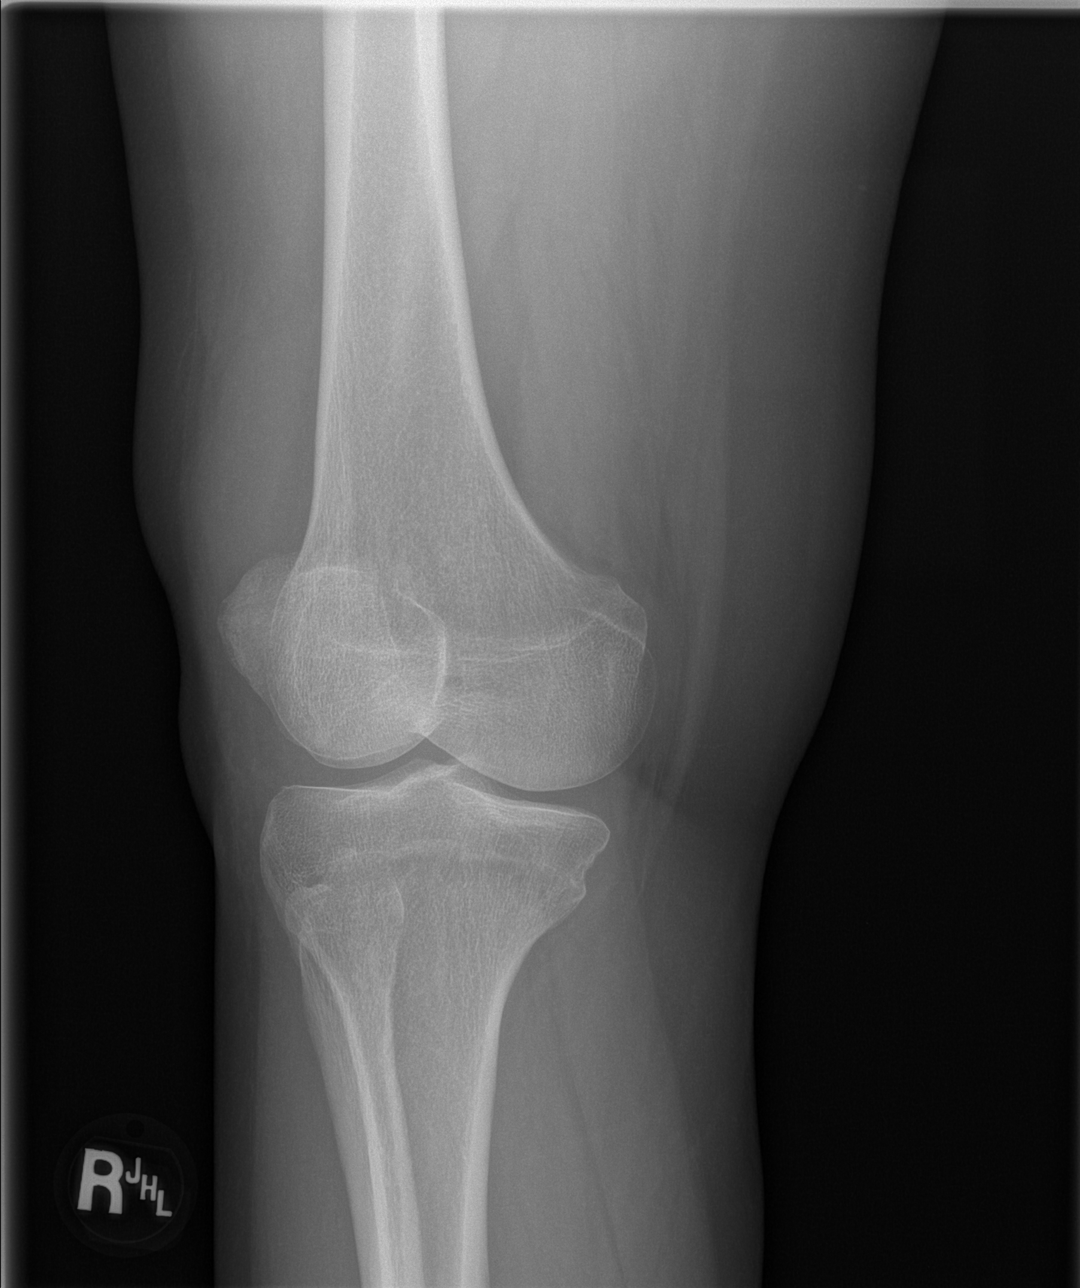

[t knee lat right]
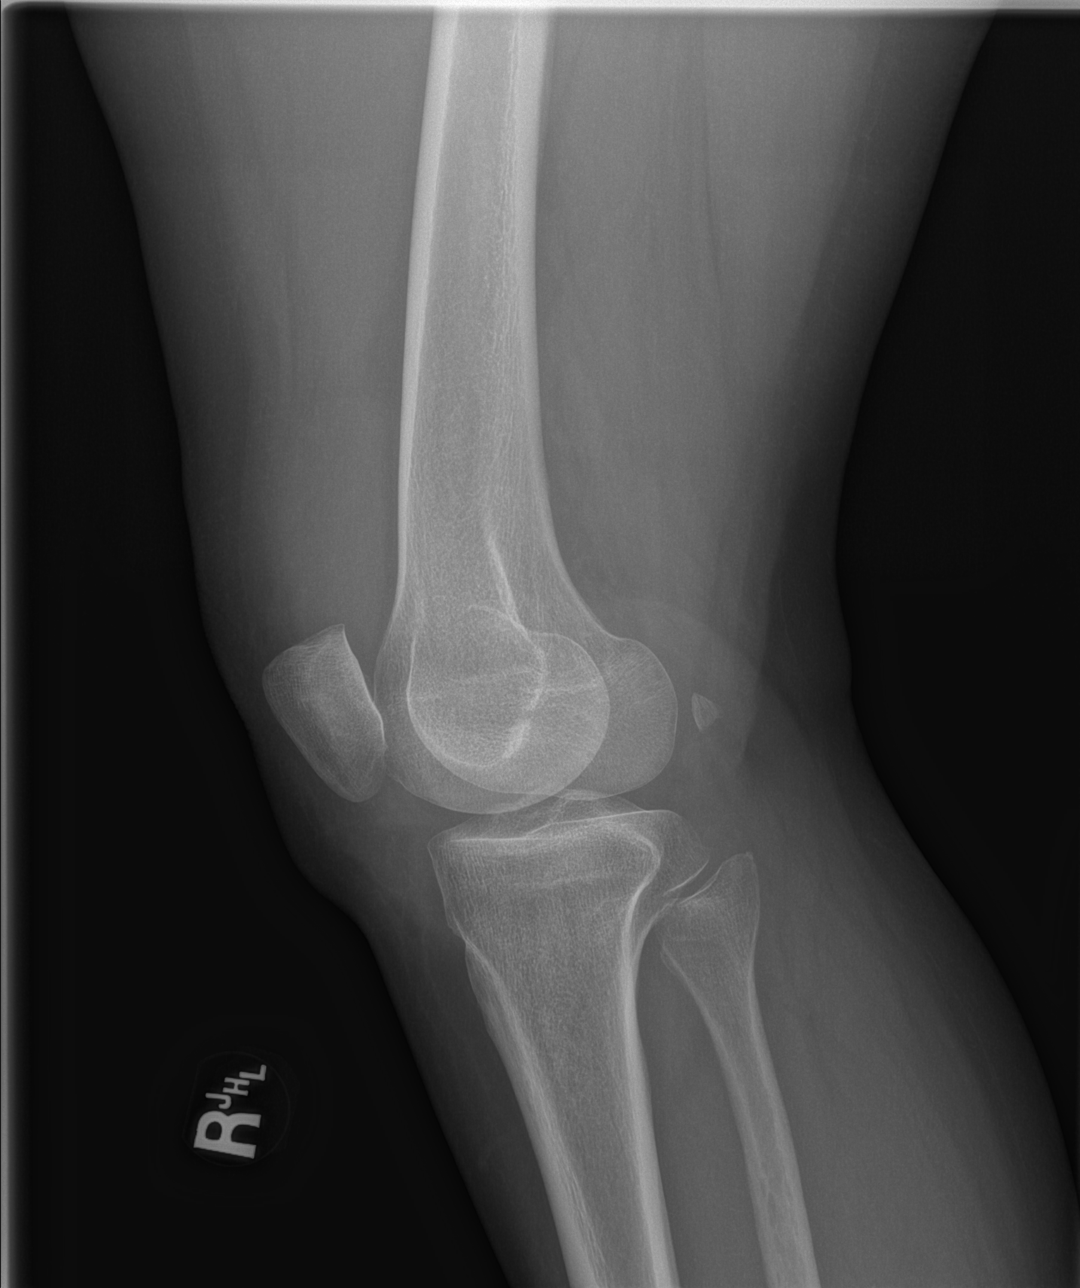

[4 of 4 positions shown; findings below may reference images not displayed]

FINDINGS: No evidence of fracture or dislocation. Small suprapatellar joint
effusion may be present. No evidence of arthropathy or other focal
bone abnormality. Soft tissues are unremarkable.
IMPRESSION: Probable small suprapatellar joint effusion. No fracture or
dislocation is noted.

## 2023-02-20 DIAGNOSIS — E1169 Type 2 diabetes mellitus with other specified complication: Secondary | ICD-10-CM | POA: Diagnosis not present

## 2023-02-20 DIAGNOSIS — E782 Mixed hyperlipidemia: Secondary | ICD-10-CM | POA: Diagnosis not present

## 2023-02-27 DIAGNOSIS — N182 Chronic kidney disease, stage 2 (mild): Secondary | ICD-10-CM | POA: Diagnosis not present

## 2023-02-27 DIAGNOSIS — Z0000 Encounter for general adult medical examination without abnormal findings: Secondary | ICD-10-CM | POA: Diagnosis not present

## 2023-02-27 DIAGNOSIS — Z136 Encounter for screening for cardiovascular disorders: Secondary | ICD-10-CM | POA: Diagnosis not present

## 2023-02-27 DIAGNOSIS — E1169 Type 2 diabetes mellitus with other specified complication: Secondary | ICD-10-CM | POA: Diagnosis not present

## 2023-02-27 DIAGNOSIS — Z1389 Encounter for screening for other disorder: Secondary | ICD-10-CM | POA: Diagnosis not present

## 2023-02-27 DIAGNOSIS — E785 Hyperlipidemia, unspecified: Secondary | ICD-10-CM | POA: Diagnosis not present

## 2023-02-27 DIAGNOSIS — Z7189 Other specified counseling: Secondary | ICD-10-CM | POA: Diagnosis not present

## 2023-02-27 DIAGNOSIS — Z139 Encounter for screening, unspecified: Secondary | ICD-10-CM | POA: Diagnosis not present

## 2023-03-07 IMAGING — DX DG CHEST 1V PORT
1 series · 1 of 1 positions shown · non-contrast
Comparison: Single-view of the chest and CT chest 08/17/2020.

CLINICAL DATA: Shortness of breath, dizziness and bilateral lower
extremity pain for the past week.

EXAM:
PORTABLE CHEST 1 VIEW

[chest ap]
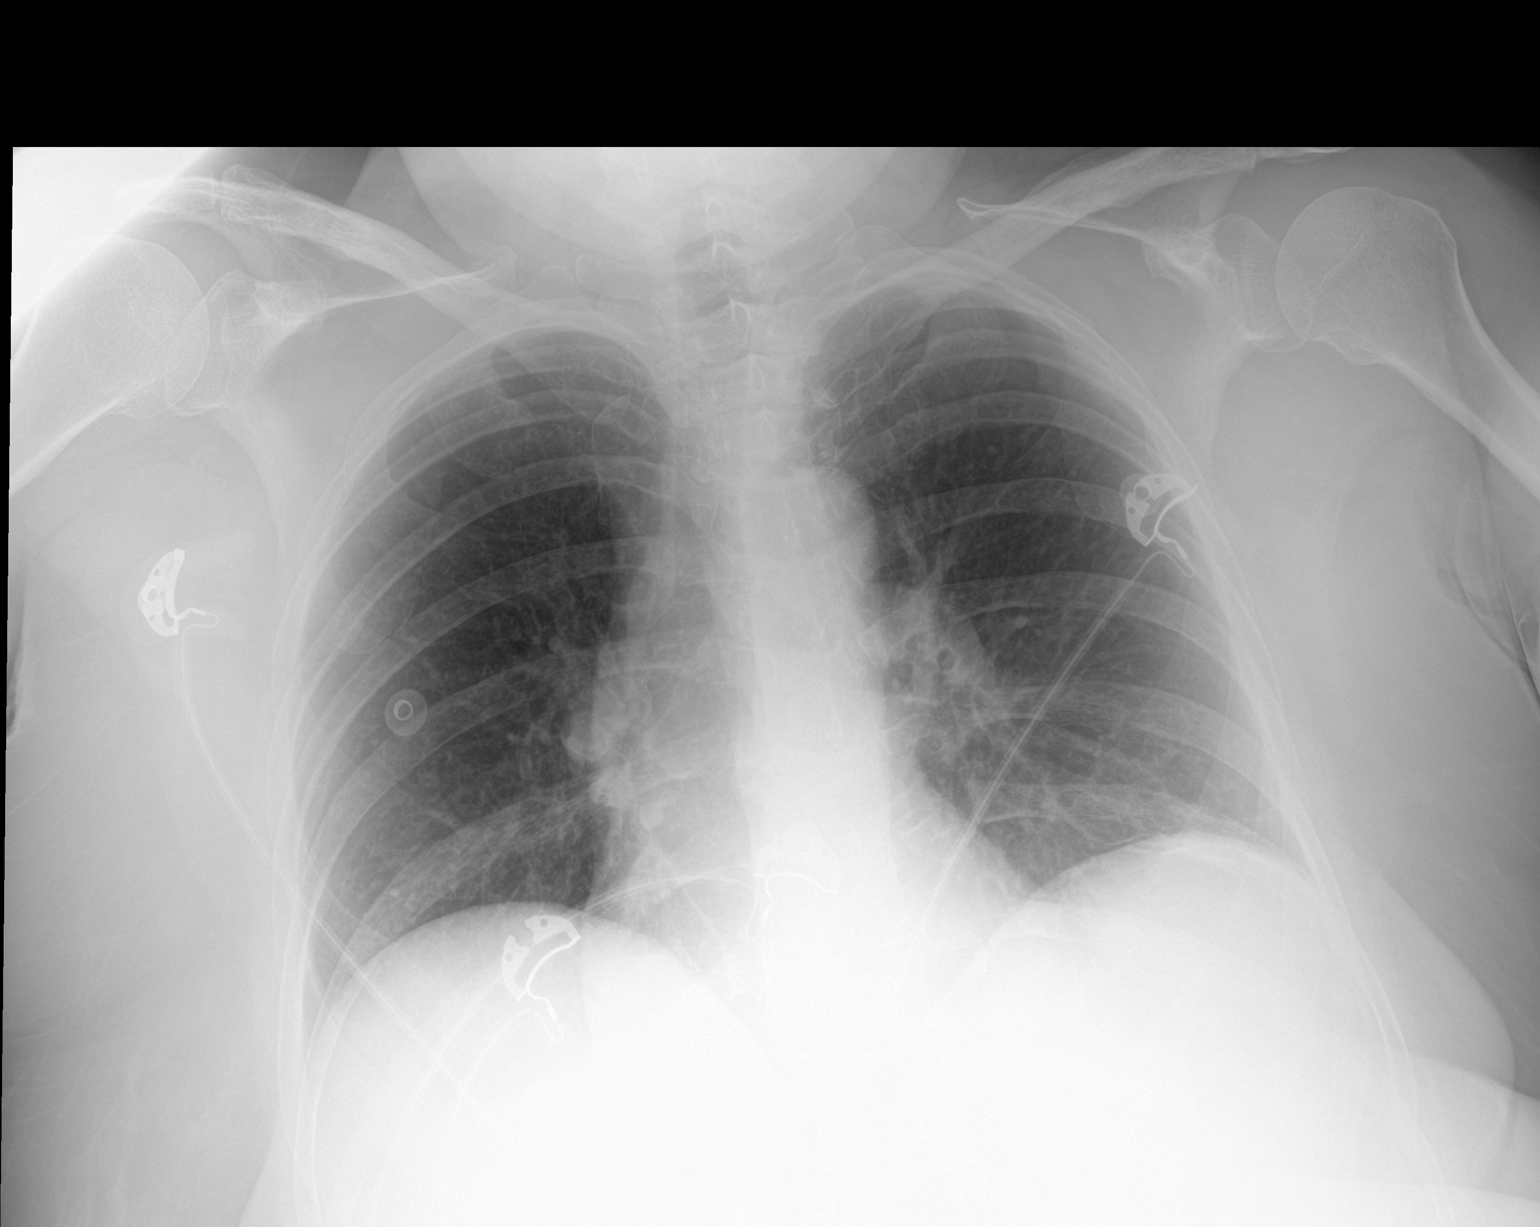

[1 of 1 positions shown; findings below may reference images not displayed]

FINDINGS: The lungs are clear. Heart size is normal. No pneumothorax or
pleural fluid. No acute or focal bony abnormality.
IMPRESSION: Negative chest.

## 2023-03-13 DIAGNOSIS — Z1231 Encounter for screening mammogram for malignant neoplasm of breast: Secondary | ICD-10-CM | POA: Diagnosis not present

## 2023-03-29 DIAGNOSIS — Z1231 Encounter for screening mammogram for malignant neoplasm of breast: Secondary | ICD-10-CM | POA: Diagnosis not present

## 2023-04-04 DIAGNOSIS — E785 Hyperlipidemia, unspecified: Secondary | ICD-10-CM | POA: Diagnosis not present

## 2023-04-04 DIAGNOSIS — E1169 Type 2 diabetes mellitus with other specified complication: Secondary | ICD-10-CM | POA: Diagnosis not present

## 2023-04-04 DIAGNOSIS — Z1231 Encounter for screening mammogram for malignant neoplasm of breast: Secondary | ICD-10-CM | POA: Diagnosis not present

## 2023-04-08 DIAGNOSIS — W19XXXA Unspecified fall, initial encounter: Secondary | ICD-10-CM | POA: Diagnosis not present

## 2023-04-08 DIAGNOSIS — S63619A Unspecified sprain of unspecified finger, initial encounter: Secondary | ICD-10-CM | POA: Diagnosis not present

## 2023-04-08 DIAGNOSIS — S6991XA Unspecified injury of right wrist, hand and finger(s), initial encounter: Secondary | ICD-10-CM | POA: Diagnosis not present

## 2023-04-08 DIAGNOSIS — S6981XA Other specified injuries of right wrist, hand and finger(s), initial encounter: Secondary | ICD-10-CM | POA: Diagnosis not present

## 2023-04-10 DIAGNOSIS — E1169 Type 2 diabetes mellitus with other specified complication: Secondary | ICD-10-CM | POA: Diagnosis not present

## 2023-04-10 DIAGNOSIS — M79641 Pain in right hand: Secondary | ICD-10-CM | POA: Diagnosis not present

## 2023-04-20 DIAGNOSIS — E1169 Type 2 diabetes mellitus with other specified complication: Secondary | ICD-10-CM | POA: Diagnosis not present

## 2023-04-20 DIAGNOSIS — Z7189 Other specified counseling: Secondary | ICD-10-CM | POA: Diagnosis not present

## 2023-05-02 DIAGNOSIS — Z7189 Other specified counseling: Secondary | ICD-10-CM | POA: Diagnosis not present

## 2023-05-02 DIAGNOSIS — E1169 Type 2 diabetes mellitus with other specified complication: Secondary | ICD-10-CM | POA: Diagnosis not present

## 2023-05-03 DIAGNOSIS — E1169 Type 2 diabetes mellitus with other specified complication: Secondary | ICD-10-CM | POA: Diagnosis not present

## 2023-05-03 DIAGNOSIS — E785 Hyperlipidemia, unspecified: Secondary | ICD-10-CM | POA: Diagnosis not present

## 2023-05-24 DIAGNOSIS — E1169 Type 2 diabetes mellitus with other specified complication: Secondary | ICD-10-CM | POA: Diagnosis not present

## 2023-05-30 DIAGNOSIS — E1169 Type 2 diabetes mellitus with other specified complication: Secondary | ICD-10-CM | POA: Diagnosis not present

## 2023-06-28 DIAGNOSIS — E1169 Type 2 diabetes mellitus with other specified complication: Secondary | ICD-10-CM | POA: Diagnosis not present

## 2023-07-05 DIAGNOSIS — E1169 Type 2 diabetes mellitus with other specified complication: Secondary | ICD-10-CM | POA: Diagnosis not present

## 2023-07-05 DIAGNOSIS — E785 Hyperlipidemia, unspecified: Secondary | ICD-10-CM | POA: Diagnosis not present

## 2024-01-04 DIAGNOSIS — E785 Hyperlipidemia, unspecified: Secondary | ICD-10-CM | POA: Diagnosis not present

## 2024-01-04 DIAGNOSIS — E1169 Type 2 diabetes mellitus with other specified complication: Secondary | ICD-10-CM | POA: Diagnosis not present

## 2024-01-04 DIAGNOSIS — H6691 Otitis media, unspecified, right ear: Secondary | ICD-10-CM | POA: Diagnosis not present
